# Patient Record
Sex: Male | Born: 1935 | Race: White | Hispanic: No | Smoking: Never smoker
Health system: Southern US, Community
[De-identification: ages and names within clinical notes are randomized; demographics above are authoritative.]

## PROBLEM LIST (undated history)

## (undated) DIAGNOSIS — R339 Retention of urine, unspecified: Secondary | ICD-10-CM

## (undated) DIAGNOSIS — R42 Dizziness and giddiness: Secondary | ICD-10-CM

## (undated) DIAGNOSIS — H8309 Labyrinthitis, unspecified ear: Secondary | ICD-10-CM

## (undated) DIAGNOSIS — C801 Malignant (primary) neoplasm, unspecified: Secondary | ICD-10-CM

## (undated) DIAGNOSIS — I10 Essential (primary) hypertension: Secondary | ICD-10-CM

## (undated) DIAGNOSIS — N210 Calculus in bladder: Secondary | ICD-10-CM

## (undated) SURGERY — CYSTOSCOPY, FLEXIBLE
Anesthesia: LOCAL

---

## 2007-10-05 ENCOUNTER — Emergency Department (HOSPITAL_COMMUNITY): Admission: EM | Admit: 2007-10-05 | Discharge: 2007-10-05 | Payer: Self-pay | Admitting: Emergency Medicine

## 2009-12-20 ENCOUNTER — Emergency Department (HOSPITAL_COMMUNITY): Admission: EM | Admit: 2009-12-20 | Discharge: 2009-12-20 | Payer: Self-pay | Admitting: Emergency Medicine

## 2010-08-04 LAB — DIFFERENTIAL
Eosinophils Absolute: 0.1 10*3/uL (ref 0.0–0.7)
Lymphocytes Relative: 15 % (ref 12–46)
Lymphs Abs: 1.3 10*3/uL (ref 0.7–4.0)
Monocytes Absolute: 0.6 10*3/uL (ref 0.1–1.0)
Monocytes Relative: 7 % (ref 3–12)

## 2010-08-04 LAB — POCT CARDIAC MARKERS
CKMB, poc: <1 ng/mL — ABNORMAL LOW (ref 1.0–8.0)
Myoglobin, poc: 46.1 ng/mL (ref 12–200)
Troponin i, poc: <0.05 ng/mL (ref 0.00–0.09)

## 2010-08-04 LAB — CBC
Hemoglobin: 15.5 g/dL (ref 13.0–17.0)
MCHC: 33.7 g/dL (ref 30.0–36.0)
MCV: 93.6 fL (ref 78.0–100.0)
WBC: 8.6 10*3/uL (ref 4.0–10.5)

## 2010-08-04 LAB — URINALYSIS, ROUTINE W REFLEX MICROSCOPIC
Glucose, UA: >1000 mg/dL — AB
Leukocytes, UA: NEGATIVE
Specific Gravity, Urine: 1.02 (ref 1.005–1.030)

## 2010-08-04 LAB — BASIC METABOLIC PANEL
BUN: 23 mg/dL (ref 6–23)
Calcium: 8.5 mg/dL (ref 8.4–10.5)
GFR calc non Af Amer: >60 mL/min (ref >60)
Potassium: 4.5 mEq/L (ref 3.5–5.1)
Sodium: 136 mEq/L (ref 135–145)

## 2010-08-04 LAB — GLUCOSE, CAPILLARY: Glucose-Capillary: 428 mg/dL — ABNORMAL HIGH (ref 70–99)

## 2011-06-15 ENCOUNTER — Inpatient Hospital Stay (HOSPITAL_COMMUNITY): Payer: PRIVATE HEALTH INSURANCE

## 2011-06-15 ENCOUNTER — Emergency Department (HOSPITAL_COMMUNITY): Payer: PRIVATE HEALTH INSURANCE

## 2011-06-15 ENCOUNTER — Other Ambulatory Visit: Payer: Self-pay

## 2011-06-15 ENCOUNTER — Encounter (HOSPITAL_COMMUNITY): Payer: Self-pay

## 2011-06-15 ENCOUNTER — Inpatient Hospital Stay (HOSPITAL_COMMUNITY)
Admission: AD | Admit: 2011-06-15 | Discharge: 2011-06-19 | DRG: 639 | Disposition: A | Discharge status: Home Health | Payer: PRIVATE HEALTH INSURANCE | Attending: Internal Medicine | Admitting: Internal Medicine

## 2011-06-15 DIAGNOSIS — Y92009 Unspecified place in unspecified non-institutional (private) residence as the place of occurrence of the external cause: Secondary | ICD-10-CM

## 2011-06-15 DIAGNOSIS — R296 Repeated falls: Secondary | ICD-10-CM | POA: Diagnosis present

## 2011-06-15 DIAGNOSIS — I517 Cardiomegaly: Secondary | ICD-10-CM

## 2011-06-15 DIAGNOSIS — E118 Type 2 diabetes mellitus with unspecified complications: Principal | ICD-10-CM | POA: Diagnosis present

## 2011-06-15 DIAGNOSIS — R627 Adult failure to thrive: Secondary | ICD-10-CM | POA: Diagnosis present

## 2011-06-15 DIAGNOSIS — E1165 Type 2 diabetes mellitus with hyperglycemia: Secondary | ICD-10-CM | POA: Diagnosis present

## 2011-06-15 DIAGNOSIS — R5381 Other malaise: Secondary | ICD-10-CM | POA: Diagnosis present

## 2011-06-15 DIAGNOSIS — Y998 Other external cause status: Secondary | ICD-10-CM

## 2011-06-15 DIAGNOSIS — R471 Dysarthria and anarthria: Secondary | ICD-10-CM | POA: Diagnosis present

## 2011-06-15 DIAGNOSIS — R739 Hyperglycemia, unspecified: Secondary | ICD-10-CM | POA: Diagnosis present

## 2011-06-15 DIAGNOSIS — IMO0002 Reserved for concepts with insufficient information to code with codable children: Principal | ICD-10-CM | POA: Diagnosis present

## 2011-06-15 DIAGNOSIS — Z7982 Long term (current) use of aspirin: Secondary | ICD-10-CM

## 2011-06-15 DIAGNOSIS — J019 Acute sinusitis, unspecified: Secondary | ICD-10-CM | POA: Diagnosis present

## 2011-06-15 DIAGNOSIS — R6251 Failure to thrive (child): Secondary | ICD-10-CM

## 2011-06-15 DIAGNOSIS — R269 Unspecified abnormalities of gait and mobility: Secondary | ICD-10-CM | POA: Diagnosis present

## 2011-06-15 DIAGNOSIS — E860 Dehydration: Secondary | ICD-10-CM | POA: Diagnosis present

## 2011-06-15 HISTORY — DX: Labyrinthitis, unspecified ear: H83.09

## 2011-06-15 LAB — COMPREHENSIVE METABOLIC PANEL
ALT: 11 U/L (ref 0–53)
AST: 12 U/L (ref 0–37)
Calcium: 8.9 mg/dL (ref 8.4–10.5)
Chloride: 98 mEq/L (ref 96–112)
GFR calc Af Amer: 71 mL/min — ABNORMAL LOW (ref >90)
Glucose, Bld: 485 mg/dL — ABNORMAL HIGH (ref 70–99)
Potassium: 4.5 mEq/L (ref 3.5–5.1)

## 2011-06-15 LAB — DIFFERENTIAL
Basophils Absolute: 0 10*3/uL (ref 0.0–0.1)
Eosinophils Absolute: 0 10*3/uL (ref 0.0–0.7)
Lymphocytes Relative: 7 % — ABNORMAL LOW (ref 12–46)
Monocytes Relative: 9 % (ref 3–12)

## 2011-06-15 LAB — GLUCOSE, CAPILLARY: Glucose-Capillary: 265 mg/dL — ABNORMAL HIGH (ref 70–99)

## 2011-06-15 LAB — URINALYSIS, ROUTINE W REFLEX MICROSCOPIC
Bilirubin Urine: NEGATIVE
Glucose, UA: >1000 mg/dL — AB
Ketones, ur: >80 mg/dL — AB
Leukocytes, UA: NEGATIVE
Nitrite: NEGATIVE
Specific Gravity, Urine: 1.02 (ref 1.005–1.030)
Urobilinogen, UA: 0.2 mg/dL (ref 0.0–1.0)
pH: 6 (ref 5.0–8.0)

## 2011-06-15 LAB — CBC
HCT: 45.8 % (ref 39.0–52.0)
MCH: 32.9 pg (ref 26.0–34.0)
RBC: 5.02 MIL/uL (ref 4.22–5.81)
RDW: 12.5 % (ref 11.5–15.5)
WBC: 15.9 10*3/uL — ABNORMAL HIGH (ref 4.0–10.5)

## 2011-06-15 LAB — RAPID URINE DRUG SCREEN, HOSP PERFORMED
Cocaine: NOT DETECTED
Opiates: NOT DETECTED
Tetrahydrocannabinol: NOT DETECTED

## 2011-06-15 LAB — URINE MICROSCOPIC-ADD ON

## 2011-06-15 LAB — CARDIAC PANEL(CRET KIN+CKTOT+MB+TROPI)
Relative Index: 2.1 (ref 0.0–2.5)
Troponin I: <0.3 ng/mL (ref <0.30)

## 2011-06-15 MED ORDER — AZITHROMYCIN 250 MG PO TABS
500.0000 mg | ORAL_TABLET | Freq: Every day | ORAL | Status: AC
  Administered 2011-06-15: 500 mg via ORAL
  Filled 2011-06-15: qty 2

## 2011-06-15 MED ORDER — ASPIRIN 300 MG RE SUPP
300.0000 mg | Freq: Every day | RECTAL | Status: DC
  Filled 2011-06-15 (×6): qty 1

## 2011-06-15 MED ORDER — INSULIN ASPART 100 UNIT/ML ~~LOC~~ SOLN
0.0000 [IU] | Freq: Every day | SUBCUTANEOUS | Status: DC
  Administered 2011-06-16 – 2011-06-17 (×2): 2 [IU] via SUBCUTANEOUS

## 2011-06-15 MED ORDER — ASPIRIN 325 MG PO TABS
325.0000 mg | ORAL_TABLET | Freq: Every day | ORAL | Status: DC
  Administered 2011-06-15 – 2011-06-19 (×5): 325 mg via ORAL
  Filled 2011-06-15 (×5): qty 1

## 2011-06-15 MED ORDER — INSULIN GLARGINE 100 UNIT/ML ~~LOC~~ SOLN
10.0000 [IU] | Freq: Every day | SUBCUTANEOUS | Status: DC
  Administered 2011-06-15: 10 [IU] via SUBCUTANEOUS
  Filled 2011-06-15: qty 3

## 2011-06-15 MED ORDER — ONDANSETRON HCL 4 MG/2ML IJ SOLN: 4.0000 mg | Freq: Four times a day (QID) | INTRAMUSCULAR | Status: DC | PRN

## 2011-06-15 MED ORDER — AZITHROMYCIN 250 MG PO TABS
250.0000 mg | ORAL_TABLET | Freq: Every day | ORAL | Status: AC
  Administered 2011-06-16 – 2011-06-19 (×4): 250 mg via ORAL
  Filled 2011-06-15 (×4): qty 1

## 2011-06-15 MED ORDER — ENOXAPARIN SODIUM 40 MG/0.4ML ~~LOC~~ SOLN
40.0000 mg | SUBCUTANEOUS | Status: DC
  Administered 2011-06-15 – 2011-06-19 (×5): 40 mg via SUBCUTANEOUS
  Filled 2011-06-15 (×5): qty 0.4

## 2011-06-15 MED ORDER — SODIUM CHLORIDE 0.9 % IV SOLN
INTRAVENOUS | Status: DC
  Administered 2011-06-15 – 2011-06-17 (×3): via INTRAVENOUS

## 2011-06-15 MED ORDER — SODIUM CHLORIDE 0.9 % IV BOLUS (SEPSIS)
500.0000 mL | Freq: Once | INTRAVENOUS | Status: AC
  Administered 2011-06-15: 500 mL via INTRAVENOUS

## 2011-06-15 MED ORDER — ACETAMINOPHEN 325 MG PO TABS: 650.0000 mg | ORAL_TABLET | ORAL | Status: DC | PRN

## 2011-06-15 MED ORDER — ACETAMINOPHEN 650 MG RE SUPP: 650.0000 mg | RECTAL | Status: DC | PRN

## 2011-06-15 MED ORDER — SENNOSIDES-DOCUSATE SODIUM 8.6-50 MG PO TABS: 1.0000 | ORAL_TABLET | Freq: Every evening | ORAL | Status: DC | PRN

## 2011-06-15 MED ORDER — INSULIN ASPART 100 UNIT/ML ~~LOC~~ SOLN
0.0000 [IU] | Freq: Three times a day (TID) | SUBCUTANEOUS | Status: DC
  Administered 2011-06-15: 8 [IU] via SUBCUTANEOUS
  Administered 2011-06-15: 15 [IU] via SUBCUTANEOUS
  Administered 2011-06-16: 3 [IU] via SUBCUTANEOUS
  Administered 2011-06-16: 11 [IU] via SUBCUTANEOUS
  Administered 2011-06-16: 8 [IU] via SUBCUTANEOUS
  Administered 2011-06-17: 3 [IU] via SUBCUTANEOUS
  Administered 2011-06-17: 5 [IU] via SUBCUTANEOUS
  Administered 2011-06-18: 2 [IU] via SUBCUTANEOUS
  Administered 2011-06-18: 3 [IU] via SUBCUTANEOUS
  Administered 2011-06-19: 8 [IU] via SUBCUTANEOUS
  Filled 2011-06-15: qty 3

## 2011-06-15 MED ORDER — SODIUM CHLORIDE 0.9 % IV SOLN
INTRAVENOUS | Status: DC
  Administered 2011-06-15: 06:00:00 via INTRAVENOUS

## 2011-06-15 NOTE — Progress Notes (Signed)
UR Chart Review Completed  

## 2011-06-15 NOTE — ED Notes (Signed)
Family at bedside. 

## 2011-06-15 NOTE — H&P (Signed)
PCP:   Monica Becton, MD, MD   Chief Complaint:  Fall  HPI: This is 77 year old gentleman with a past history of diabetes who lives independently. Patient reports that he was at home today his usual state of health. He had been noticing that he can having leg weakness for quite some time now, on and off. He was walking back from his bathroom when he felt that his legs were too weak and he fell. Patient did not lose consciousness. He denies any head trauma. He denies any chest pain, shortness of breath. He reports having upper respiratory tract symptoms and has had "a cold "for some time now. His by mouth intake has been poor. Patient pressed his life alert bracelet and was brought to the emergency room for evaluation. His family presented to the hospital when they were informed that he was brought here by life alert. They cannot add much towards history since the last time they saw/spoke to him was approximately a week ago. It was noted in the emergency room the patient had a significant dysarthria. He was having difficulty articulating his words. His family reports that this is a significant difference from when I last saw him approximately a week ago. It is unclear as to the exact onset of symptoms. Due to patient's difficulty with speech, obtaining history has been very difficult. His family reports that he is fairly functional. He is able to pay his own bills, do his own shopping. He no longer drives but is otherwise very functional. He ambulates independently. He denies being started on any new medication. He's not sure what medication he takes. Allergies:  No Known Allergies    Past Medical History  Diagnosis Date  . Diabetes mellitus   . Inner ear inflammation     History reviewed. No pertinent past surgical history.  Prior to Admission medications   Not on File    Social History:  reports that he has never smoked. He does not have any smokeless tobacco history on file. He  reports that he does not drink alcohol or use illicit drugs.  No family history on file.  Review of Systems: Positives in bold Constitutional: Denies fever, chills, diaphoresis, appetite change and fatigue.  HEENT: Denies photophobia, eye pain, redness, hearing loss, ear pain, congestion, sore throat, rhinorrhea, sneezing, mouth sores, trouble swallowing, neck pain, neck stiffness and tinnitus.   Respiratory: Denies SOB, DOE, cough, chest tightness,  and wheezing.   Cardiovascular: Denies chest pain, palpitations and leg swelling.  Gastrointestinal: Denies nausea, vomiting, abdominal pain, diarrhea, constipation, blood in stool and abdominal distention.  Genitourinary: Denies dysuria, urgency, frequency, hematuria, flank pain and difficulty urinating.  Musculoskeletal: Denies myalgias, back pain, joint swelling, arthralgias and gait problem.  Skin: Denies pallor, rash and wound.  Neurological: Denies dizziness, seizures, syncope, weakness, light-headedness, numbness and headaches.  Hematological: Denies adenopathy. Easy bruising, personal or family bleeding history  Psychiatric/Behavioral: Denies suicidal ideation, mood changes, confusion, nervousness, sleep disturbance and agitation   Physical Exam: Blood pressure 145/62, pulse 84, temperature 98.6 F (37 C), temperature source Rectal, resp. rate 16, height 5\' 6"  (1.676 m), weight 63.504 kg (140 lb), SpO2 96.00%. General: Patient is in no acute distress, sitting up in bed, awake, speech is difficult to make out and is often incoherent HEENT: Normocephalic, atraumatic, pupils are equal and reactive to light, no obvious facial asymmetry Neck: Supple Chest: Clear to auscultation bilaterally Cardiac: S1, S2, regular rate and rhythm Abdomen: Soft, nontender, nondistended, bowel sounds are active  Extremities: No edema, cyanosis or clubbing Neurologic patient has 3 or 4 strength bilaterally lower extremities, 4-5 strength in the upper  extremities bilaterally, cranial nerves appear to be grossly intact Labs on Admission:  Results for orders placed during the hospital encounter of 06/15/11 (from the past 48 hour(s))  CBC     Status: Abnormal   Collection Time   06/15/11  5:13 AM      Component Value Range Comment   WBC 15.9 (*) 4.0 - 10.5 (K/uL)    RBC 5.02  4.22 - 5.81 (MIL/uL)    Hemoglobin 16.5  13.0 - 17.0 (g/dL)    HCT 78.2  95.6 - 21.3 (%)    MCV 91.2  78.0 - 100.0 (fL)    MCH 32.9  26.0 - 34.0 (pg)    MCHC 36.0  30.0 - 36.0 (g/dL)    RDW 08.6  57.8 - 46.9 (%)    Platelets 144 (*) 150 - 400 (K/uL)   DIFFERENTIAL     Status: Abnormal   Collection Time   06/15/11  5:13 AM      Component Value Range Comment   Neutrophils Relative 84 (*) 43 - 77 (%)    Neutro Abs 13.4 (*) 1.7 - 7.7 (K/uL)    Lymphocytes Relative 7 (*) 12 - 46 (%)    Lymphs Abs 1.1  0.7 - 4.0 (K/uL)    Monocytes Relative 9  3 - 12 (%)    Monocytes Absolute 1.4 (*) 0.1 - 1.0 (K/uL)    Eosinophils Relative 0  0 - 5 (%)    Eosinophils Absolute 0.0  0.0 - 0.7 (K/uL)    Basophils Relative 0  0 - 1 (%)    Basophils Absolute 0.0  0.0 - 0.1 (K/uL)   COMPREHENSIVE METABOLIC PANEL     Status: Abnormal   Collection Time   06/15/11  5:13 AM      Component Value Range Comment   Sodium 136  135 - 145 (mEq/L)    Potassium 4.5  3.5 - 5.1 (mEq/L)    Chloride 98  96 - 112 (mEq/L)    CO2 23  19 - 32 (mEq/L)    Glucose, Bld 485 (*) 70 - 99 (mg/dL)    BUN 20  6 - 23 (mg/dL)    Creatinine, Ser 6.29  0.50 - 1.35 (mg/dL)    Calcium 8.9  8.4 - 10.5 (mg/dL)    Total Protein 7.0  6.0 - 8.3 (g/dL)    Albumin 3.3 (*) 3.5 - 5.2 (g/dL)    AST 12  0 - 37 (U/L)    ALT 11  0 - 53 (U/L)    Alkaline Phosphatase 77  39 - 117 (U/L)    Total Bilirubin 1.1  0.3 - 1.2 (mg/dL)    GFR calc non Af Amer 62 (*) >90 (mL/min)    GFR calc Af Amer 71 (*) >90 (mL/min)   CARDIAC PANEL(CRET KIN+CKTOT+MB+TROPI)     Status: Normal   Collection Time   06/15/11  5:13 AM      Component  Value Range Comment   Total CK 80  7 - 232 (U/L)    CK, MB 1.6  0.3 - 4.0 (ng/mL)    Troponin I <0.30  <0.30 (ng/mL)    Relative Index RELATIVE INDEX IS INVALID  0.0 - 2.5    URINALYSIS, ROUTINE W REFLEX MICROSCOPIC     Status: Abnormal   Collection Time   06/15/11  6:14 AM  Component Value Range Comment   Color, Urine YELLOW  YELLOW     APPearance CLEAR  CLEAR     Specific Gravity, Urine 1.020  1.005 - 1.030     pH 6.0  5.0 - 8.0     Glucose, UA >1000 (*) NEGATIVE (mg/dL)    Hgb urine dipstick MODERATE (*) NEGATIVE     Bilirubin Urine NEGATIVE  NEGATIVE     Ketones, ur >80 (*) NEGATIVE (mg/dL)    Protein, ur TRACE (*) NEGATIVE (mg/dL)    Urobilinogen, UA 0.2  0.0 - 1.0 (mg/dL)    Nitrite NEGATIVE  NEGATIVE     Leukocytes, UA NEGATIVE  NEGATIVE    URINE MICROSCOPIC-ADD ON     Status: Normal   Collection Time   06/15/11  6:14 AM      Component Value Range Comment   WBC, UA 3-6  <3 (WBC/hpf)     Radiological Exams on Admission: Ct Head Wo Contrast  06/15/2011  *RADIOLOGY REPORT*  Clinical Data: Status post fall; unknown loss of consciousness. Concern for head injury.  CT HEAD WITHOUT CONTRAST  Technique:  Contiguous axial images were obtained from the base of the skull through the vertex without contrast.  Comparison: CT of the head performed 12/20/2009  Findings: There is no evidence of acute infarction, mass lesion, or intra- or extra-axial hemorrhage on CT.  Diffuse periventricular and subcortical white matter change likely reflects small vessel ischemic microangiopathy.  Mild cortical volume loss is noted.  Cerebellar atrophy is seen.  Chronic lacunar infarcts are seen within the basal ganglia bilaterally; a chronic right globus pallidus infarct appears new from 2011.  The brainstem and fourth ventricle are within normal limits.  The cerebral hemispheres demonstrate grossly normal gray-white differentiation.  No mass effect or midline shift is seen.  There is no evidence of  fracture; visualized osseous structures are unremarkable in appearance.  The orbits are within normal limits. There is near-complete opacification of the right maxillary sinus; the paranasal sinuses and mastoid air cells are well-aerated.  No significant soft tissue abnormalities are seen.  Cerumen is noted within the external auditory canals bilaterally, particularly on the left.  IMPRESSION:  1.  No evidence of traumatic intracranial injury or fracture. 2.  Mild cortical volume loss and diffuse small vessel ischemic microangiopathy. 3.  Chronic lacunar infarcts within the basal ganglia bilaterally. 4.  Near-complete opacification of the right maxillary sinus. 5.  Cerumen noted within the external auditory canals bilaterally, particularly on the left.  Original Report Authenticated By: Tonia Ghent, M.D.   Dg Chest Port 1 View  06/15/2011  *RADIOLOGY REPORT*  Clinical Data: Status post fall; concern for chest injury.  PORTABLE CHEST - 1 VIEW  Comparison: Chest radiograph performed 12/20/2009  Findings: The lungs are well-aerated and clear.  There is no evidence of focal opacification, pleural effusion or pneumothorax.  The cardiomediastinal silhouette is within normal limits.  No acute osseous abnormalities are seen.  IMPRESSION: No acute cardiopulmonary process seen; no displaced rib fractures identified.  Original Report Authenticated By: Tonia Ghent, M.D.    Assessment/Plan Principal Problem:  *CVA (cerebral infarction) Active Problems:  Hyperglycemia  Diabetes mellitus  Dysarthria  Acute sinusitis  Failure to thrive  Dehydration  Plan: Reason the patient was brought to the hospital was not entirely clear but has significant dysarthria is concerning. Patient reports that he was taking a baby aspirin but has been taking this consistently. He does not know the remainder of his medications. He  will need to be admitted to have a further stroke workup. He'll be monitored on telemetry, cardiac  enzymes will be cycled, he will get a 2-D echocardiogram, MRI of the brain, carotid Dopplers. We will also get physical therapy, speech therapy, occupational therapy to see the patient. Will check hemoglobin A1c, lipid panel.  He will receive aspirin. He is not a candidate for TPA therapy due to delay in patient arrival.  Regarding his hyperglycemia. He does not have any evidence of an elevated anion gap. We will start him on Lantus as well as sliding scale insulin and make adjustments accordingly until his home regimen can be determined.  Regarding his sinusitis will start him on a course of Z-Pak.  We'll provide gentle hydration.  Patient requests DO NOT RESUSCITATE, further orders per clinical course.  Time Spent on Admission:  Zaire Vanbuskirk [facility] 06/15/2011, 8:19 AM

## 2011-06-15 NOTE — ED Notes (Signed)
Inserted in & out cath to obtain urine. Noticed Dr Adriana Simas ordered to insert foley catheter. When questioned, MD stated we did not have to do the foley catheter since we obtained urine by straight cath. Verbal order to cancel foley catheter.

## 2011-06-15 NOTE — Progress Notes (Signed)
*  PRELIMINARY RESULTS* Echocardiogram 2D Echocardiogram has been performed.  Charles Gibbs Jane 06/15/2011, 1:56 PM 

## 2011-06-15 NOTE — ED Notes (Signed)
Pt given something to drink and eat per EDP orders prior to admitting hospitalist seeing patient. Pt diagnosis initially failure to thrive and hyperglycemia.

## 2011-06-15 NOTE — ED Notes (Signed)
Family at bedside. Pt sitting up eating breakfast tray at this time.

## 2011-06-15 NOTE — ED Provider Notes (Signed)
History     CSN: [id number]  Arrival date & time 06/15/11  0502   First MD Initiated Contact with Patient 06/15/11 0507      Chief Complaint  Patient presents with  . Fall    (Consider location/radiation/quality/duration/timing/severity/associated sxs/prior treatment) HPI....Marland Kitchenlevel V caveat for confusion. Patient fell on the floor at his home. He is unable to get up. EMS reports urinary incontinence on the couch. No chest pain, shortness of breath, stiff neck, dysuria. He lives alone. He is able answer questions reasonably well. Nothing makes symptoms better or  Past Medical History  Diagnosis Date  . Diabetes mellitus     History reviewed. No pertinent past surgical history.  No family history on file.  History  Substance Use Topics  . Smoking status: Never Smoker   . Smokeless tobacco: Not on file  . Alcohol Use: No      Review of Systems  Unable to perform ROS: Other    Allergies  Review of patient's allergies indicates no known allergies.  Home Medications  No current outpatient prescriptions on file.  BP 166/64  Pulse 86  Temp(Src) 98.6 F (37 C) (Rectal)  Resp 18  Ht 5\' 6"  (1.676 m)  Wt 140 lb (63.504 kg)  BMI 22.60 kg/m2  SpO2 96%  Physical Exam  Nursing note and vitals reviewed. Constitutional: He appears well-developed and well-nourished.       Obvious urinary incontinence, slight confusion.  HENT:  Head: Normocephalic and atraumatic.  Eyes: Conjunctivae and EOM are normal. Pupils are equal, round, and reactive to light.  Neck: Normal range of motion. Neck supple.  Cardiovascular: Normal rate and regular rhythm.   Pulmonary/Chest: Effort normal and breath sounds normal.  Abdominal: Soft. Bowel sounds are normal.  Musculoskeletal: Normal range of motion.  Neurological:       Knows name and place, not date the  Skin: Skin is warm and dry.  Psychiatric:       Flat affect    ED Course  Procedures (including critical care time)   Labs  Reviewed  CBC  DIFFERENTIAL  COMPREHENSIVE METABOLIC PANEL  URINALYSIS, ROUTINE W REFLEX MICROSCOPIC  CARDIAC PANEL(CRET KIN+CKTOT+MB+TROPI)   No results found.   No diagnosis found.  Date: 06/15/2011  Rate: 84  Rhythm: normal sinus rhythm  QRS Axis: left  Intervals: normal  ST/T Wave abnormalities: normal  Conduction Disutrbances:none  Narrative Interpretation:   Old EKG Reviewed: unchanged    MDM  Glucose is elevated. Urinalysis and chest x-ray pending. Patient is obviously unable to care for self. Admit        Donnetta Hutching, MD 06/15/11 [phone]

## 2011-06-15 NOTE — ED Notes (Signed)
Pt hit lifeline, ems arrived to find pt in floor, alert but unable to answer simple ?'s

## 2011-06-16 LAB — LIPID PANEL
LDL Cholesterol: 66 mg/dL (ref 0–99)
Triglycerides: 79 mg/dL (ref <150)

## 2011-06-16 LAB — GLUCOSE, CAPILLARY
Glucose-Capillary: 309 mg/dL — ABNORMAL HIGH (ref 70–99)
Glucose-Capillary: 287 mg/dL — ABNORMAL HIGH (ref 70–99)
Glucose-Capillary: 210 mg/dL — ABNORMAL HIGH (ref 70–99)

## 2011-06-16 LAB — CARDIAC PANEL(CRET KIN+CKTOT+MB+TROPI)
CK, MB: 2.5 ng/mL (ref 0.3–4.0)
Relative Index: 2.2 (ref 0.0–2.5)

## 2011-06-16 LAB — HEMOGLOBIN A1C: Hgb A1c MFr Bld: 12.2 % — ABNORMAL HIGH (ref <5.7)

## 2011-06-16 MED ORDER — GLIPIZIDE 5 MG PO TABS
5.0000 mg | ORAL_TABLET | Freq: Two times a day (BID) | ORAL | Status: DC
  Administered 2011-06-16 – 2011-06-19 (×6): 5 mg via ORAL
  Filled 2011-06-16 (×6): qty 1

## 2011-06-16 NOTE — Evaluation (Signed)
Physical Therapy Evaluation Patient Details Name: Charles Gibbs MRN: [id number] DOB: [birth year] Today's Date: 06/16/2011  Problem List:  Patient Active Problem List  Diagnoses  . Hyperglycemia  . Diabetes mellitus  . Dysarthria  . Acute sinusitis  . Failure to thrive  . Dehydration    Past Medical History:  Past Medical History  Diagnosis Date  . Diabetes mellitus   . Inner ear inflammation    Past Surgical History: History reviewed. No pertinent past surgical history.  PT Assessment/Plan/Recommendation PT Assessment Clinical Impression Statement: Pt with CVA who presents with general weaknes but strength is wfl to allow mobility.  Pt will benefit from [facility] for home eval to ensure safety. PT Recommendation/Assessment: All further PT needs can be met in the next venue of care PT Problem List: Decreased strength PT Therapy Diagnosis : Generalized weakness;Hemiplegia dominant side PT Recommendation Follow Up Recommendations: Home health PT PT Goals  Acute Rehab PT Goals PT Goal Formulation: With patient Time For Goal Achievement: 4 days Pt will go Supine/Side to Sit: Independently Pt will Ambulate: >150 feet;Independently;with least restrictive assistive device Pt will Go Up / Down Stairs: 3-5 stairs;with modified independence  PT Evaluation Precautions/Restrictions    Prior Functioning  Home Living Lives With: Alone Receives Help From: Home health;Family Type of Home: House Home Layout: One level Home Access: Stairs to enter Entrance Stairs-Rails: Right Entrance Stairs-Number of Steps: 3 Bathroom Shower/Tub: Health visitor: Standard Home Adaptive Equipment: Environmental consultant - standard;Straight cane Prior Function Level of Independence: Independent with homemaking with ambulation;Independent with basic ADLs Driving: No Vocation: Retired Producer, television/film/video: Awake/alert Overall Cognitive Status: Appears within functional limits for  tasks assessed Orientation Level: Oriented X4 Sensation/Coordination Sensation Light Touch: Appears Intact Coordination Gross Motor Movements are Fluid and Coordinated: Yes Fine Motor Movements are Fluid and Coordinated: Not tested Extremity Assessment RUE Assessment RUE Assessment: Not tested LUE Assessment LUE Assessment: Not tested RLE Assessment RLE Assessment: Within Functional Limits (strength decreased generally 3+/5) LLE Assessment LLE Assessment: Within Functional Limits (strength decreased generally 3+/5) Mobility (including Balance) Bed Mobility Bed Mobility: Yes Supine to Sit: 6: Modified independent (Device/Increase time) Transfers Transfers: Yes Sit to Stand: 7: Independent Stand to Sit: 7: Independent Ambulation/Gait Ambulation/Gait: Yes Ambulation/Gait Assistance: 6: Modified independent (Device/Increase time) Ambulation Distance (Feet): 200 Feet Assistive device: Rolling walker;None (100 ft with RW; 100 ft with no AD) Gait Pattern: Decreased hip/knee flexion - right Stairs: No Wheelchair Mobility Wheelchair Mobility: No  Balance Balance Assessed: No Exercise    End of Session PT - End of Session Equipment Utilized During Treatment: Gait belt Activity Tolerance: Patient tolerated treatment well Patient left: in chair Nurse Communication: Mobility status for transfers General Behavior During Session: [facility] for tasks performed Cognition: [facility] for tasks performed  RUSSELL,CINDY 06/16/2011, 2:20 PM

## 2011-06-16 NOTE — Plan of Care (Signed)
Problem: Phase I Progression Outcomes Goal: Voiding-avoid urinary catheter unless indicated Outcome: Completed/Met Date Met:  06/16/11 vioding

## 2011-06-16 NOTE — Progress Notes (Addendum)
Subjective: Patient's speech is significantly improved. Stroke work up thus far has been negative. Denies any complaints today, feels weak.  Objective: Vital signs in last 24 hours: Temp:  [97 F (36.1 C)-98.4 F (36.9 C)] 97.9 F (36.6 C) (01/26 1000) Pulse Rate:  [70-80] 70  (01/26 1000) Resp:  [18] 18  (01/26 1000) BP: (103-163)/(56-70) 127/64 mmHg (01/26 1000) SpO2:  [96 %-97 %] 97 % (01/26 1000) Weight change: -7.404 kg (-16 lb 5.2 oz) Last BM Date: 06/16/11  Intake/Output from previous day: 01/25 0701 - 01/26 0700 In: 1582.5 [P.O.:720; I.V.:862.5] Out: 201 [Urine:200; Stool:1] Total I/O In: -  Out: 301 [Urine:300; Stool:1]   Physical Exam: General: Alert, awake, oriented x3, in no acute distress. HEENT: No bruits, no goiter. Heart: Regular rate and rhythm, without murmurs, rubs, gallops. Lungs: Clear to auscultation bilaterally. Abdomen: Soft, nontender, nondistended, positive bowel sounds. Extremities: No clubbing cyanosis or edema with positive pedal pulses. Neuro: Grossly intact, nonfocal.    Lab Results: Basic Metabolic Panel:  Basename 06/15/11 0513  NA 136  K 4.5  CL 98  CO2 23  GLUCOSE 485*  BUN 20  CREATININE 1.09  CALCIUM 8.9  MG --  PHOS --   Liver Function Tests:  Basename 06/15/11 0513  AST 12  ALT 11  ALKPHOS 77  BILITOT 1.1  PROT 7.0  ALBUMIN 3.3*   No results found for this basename: LIPASE:2,AMYLASE:2 in the last 72 hours No results found for this basename: AMMONIA:2 in the last 72 hours CBC:  Basename 06/15/11 0513  WBC 15.9*  NEUTROABS 13.4*  HGB 16.5  HCT 45.8  MCV 91.2  PLT 144*   Cardiac Enzymes:  Basename 06/16/11 0137 06/15/11 1706 06/15/11 0513  CKTOTAL 112 132 80  CKMB 2.5 2.8 1.6  CKMBINDEX -- -- --  TROPONINI <0.30 <0.30 <0.30   BNP: No results found for this basename: PROBNP:3 in the last 72 hours D-Dimer: No results found for this basename: DDIMER:2 in the last 72 hours CBG:  Basename 06/16/11  1150 06/16/11 0726 06/15/11 2029 06/15/11 1628 06/15/11 1221  GLUCAP 309* 176* 135* 265* 491*   Hemoglobin A1C: No results found for this basename: HGBA1C in the last 72 hours Fasting Lipid Panel:  Basename 06/16/11 0300  CHOL 132  HDL 50  LDLCALC 66  TRIG 79  CHOLHDL 2.6  LDLDIRECT --   Thyroid Function Tests:  Basename 06/15/11 0945  TSH 0.466  T4TOTAL --  FREET4 --  T3FREE --  THYROIDAB --   Anemia Panel: No results found for this basename: VITAMINB12,FOLATE,FERRITIN,TIBC,IRON,RETICCTPCT in the last 72 hours Coagulation: No results found for this basename: LABPROT:2,INR:2 in the last 72 hours Urine Drug Screen: Drugs of Abuse     Component Value Date/Time   LABOPIA NONE DETECTED 06/15/2011 1555   COCAINSCRNUR NONE DETECTED 06/15/2011 1555   LABBENZ NONE DETECTED 06/15/2011 1555   AMPHETMU NONE DETECTED 06/15/2011 1555   THCU NONE DETECTED 06/15/2011 1555   LABBARB NONE DETECTED 06/15/2011 1555    Alcohol Level:  Basename 06/15/11 0945  ETH <11    No results found for this or any previous visit (from the past 240 hour(s)).  Studies/Results: Ct Head Wo Contrast  06/15/2011  *RADIOLOGY REPORT*  Clinical Data: Status post fall; unknown loss of consciousness. Concern for head injury.  CT HEAD WITHOUT CONTRAST  Technique:  Contiguous axial images were obtained from the base of the skull through the vertex without contrast.  Comparison: CT of the head performed 12/20/2009  Findings: There is no evidence of acute infarction, mass lesion, or intra- or extra-axial hemorrhage on CT.  Diffuse periventricular and subcortical white matter change likely reflects small vessel ischemic microangiopathy.  Mild cortical volume loss is noted.  Cerebellar atrophy is seen.  Chronic lacunar infarcts are seen within the basal ganglia bilaterally; a chronic right globus pallidus infarct appears new from 2011.  The brainstem and fourth ventricle are within normal limits.  The cerebral hemispheres  demonstrate grossly normal gray-white differentiation.  No mass effect or midline shift is seen.  There is no evidence of fracture; visualized osseous structures are unremarkable in appearance.  The orbits are within normal limits. There is near-complete opacification of the right maxillary sinus; the paranasal sinuses and mastoid air cells are well-aerated.  No significant soft tissue abnormalities are seen.  Cerumen is noted within the external auditory canals bilaterally, particularly on the left.  IMPRESSION:  1.  No evidence of traumatic intracranial injury or fracture. 2.  Mild cortical volume loss and diffuse small vessel ischemic microangiopathy. 3.  Chronic lacunar infarcts within the basal ganglia bilaterally. 4.  Near-complete opacification of the right maxillary sinus. 5.  Cerumen noted within the external auditory canals bilaterally, particularly on the left.  Original Report Authenticated By: Tonia Ghent, M.D.   Mr Maxine Glenn Head Wo Contrast  06/15/2011  *RADIOLOGY REPORT*  Clinical Data:  78 year old male with recent falls and weakness.  Comparison: Head CTs 06/15/2011 and earlier.  MRI HEAD WITHOUT CONTRAST  Technique: Multiplanar, multiecho pulse sequences of the brain and surrounding structures were obtained according to standard protocol without intravenous contrast.  Findings: No restricted diffusion to suggest acute infarction.  No midline shift, mass effect, evidence of mass lesion, ventriculomegaly, extra-axial collection or acute intracranial hemorrhage.  Cervicomedullary junction and pituitary are within normal limits.  Major intracranial vascular flow voids are preserved.  Confluent T2 and FLAIR hyperintensity throughout the cerebral white matter.  Temporal lobe involvement.  Comparatively mild white matter signal changes in the brainstem.  T2 and FLAIR hyperintensity also noted in the deep cerebellar nuclei.  No cerebellar peduncle involvement.  No cortical encephalomalacia. Chronic lacunar  infarct in the right globus pallidus.  Mild T2 heterogeneity for age in the deep gray matter nuclei elsewhere. Negative visualized cervical spine.  Visualized bone marrow signal is within normal limits.  Visualized orbit soft tissues are within normal limits.  Mastoids are clear. There is mucosal thickening in the right maxillary and ethmoid sinuses, the right maxillary is opacified.  Visualized scalp soft tissues are within normal limits.  IMPRESSION: 1. No acute intracranial abnormality. 2.  Advanced nonspecific signal changes in the white matter and deep cerebellar nuclei.  Favor chronic small vessel disease. 3.  MRA findings are below.  MRA HEAD WITHOUT CONTRAST  Technique: Angiographic images of the Circle of Willis were obtained using MRA technique without  intravenous contrast.  Findings: Antegrade flow in the posterior circulation.  Nondominant distal left vertebral artery functionally terminates in PICA. Right vertebral artery supplies the basilar.  No basilar artery stenosis.  Superior cerebellar arteries and PCA origins are patent. Posterior communicating arteries are diminutive or absent.  MOTSA artifact occurs at the level of the PCA course.  MIP images suggest bilateral PCA stenosis, but I feel this is related to be artifact.  Antegrade flow in both ICA siphons.  Cavernous segment irregularity but no significant ICA stenosis.  Ophthalmic artery origins are within normal limits.  Normal carotid termini.  Dominant left ACA A1 segment.  Diminutive anterior communicating artery.  Visualized ACA branches are within normal limits.  Visualized bilateral MCA branches are also affected by the MOTSA artifact.  No major MCA branch stenosis or occlusion identified.  IMPRESSION:  Negative for age intracranial MRA, with degradation of medium-sized branch detail due to MOTSA artifact.  Original Report Authenticated By: Harley Hallmark, M.D.   Mr Brain Wo Contrast  06/15/2011  *RADIOLOGY REPORT*  Clinical Data:   78 year old male with recent falls and weakness.  Comparison: Head CTs 06/15/2011 and earlier.  MRI HEAD WITHOUT CONTRAST  Technique: Multiplanar, multiecho pulse sequences of the brain and surrounding structures were obtained according to standard protocol without intravenous contrast.  Findings: No restricted diffusion to suggest acute infarction.  No midline shift, mass effect, evidence of mass lesion, ventriculomegaly, extra-axial collection or acute intracranial hemorrhage.  Cervicomedullary junction and pituitary are within normal limits.  Major intracranial vascular flow voids are preserved.  Confluent T2 and FLAIR hyperintensity throughout the cerebral white matter.  Temporal lobe involvement.  Comparatively mild white matter signal changes in the brainstem.  T2 and FLAIR hyperintensity also noted in the deep cerebellar nuclei.  No cerebellar peduncle involvement.  No cortical encephalomalacia. Chronic lacunar infarct in the right globus pallidus.  Mild T2 heterogeneity for age in the deep gray matter nuclei elsewhere. Negative visualized cervical spine.  Visualized bone marrow signal is within normal limits.  Visualized orbit soft tissues are within normal limits.  Mastoids are clear. There is mucosal thickening in the right maxillary and ethmoid sinuses, the right maxillary is opacified.  Visualized scalp soft tissues are within normal limits.  IMPRESSION: 1. No acute intracranial abnormality. 2.  Advanced nonspecific signal changes in the white matter and deep cerebellar nuclei.  Favor chronic small vessel disease. 3.  MRA findings are below.  MRA HEAD WITHOUT CONTRAST  Technique: Angiographic images of the Circle of Willis were obtained using MRA technique without  intravenous contrast.  Findings: Antegrade flow in the posterior circulation.  Nondominant distal left vertebral artery functionally terminates in PICA. Right vertebral artery supplies the basilar.  No basilar artery stenosis.  Superior  cerebellar arteries and PCA origins are patent. Posterior communicating arteries are diminutive or absent.  MOTSA artifact occurs at the level of the PCA course.  MIP images suggest bilateral PCA stenosis, but I feel this is related to be artifact.  Antegrade flow in both ICA siphons.  Cavernous segment irregularity but no significant ICA stenosis.  Ophthalmic artery origins are within normal limits.  Normal carotid termini.  Dominant left ACA A1 segment.  Diminutive anterior communicating artery.  Visualized ACA branches are within normal limits.  Visualized bilateral MCA branches are also affected by the MOTSA artifact.  No major MCA branch stenosis or occlusion identified.  IMPRESSION:  Negative for age intracranial MRA, with degradation of medium-sized branch detail due to MOTSA artifact.  Original Report Authenticated By: Harley Hallmark, M.D.   US Carotid Duplex Bilateral  06/15/2011  *RADIOLOGY REPORT*  Clinical Data: Stroke, history diabetes  BILATERAL CAROTID DUPLEX ULTRASOUND  Technique: Wallace Cullens scale imaging, color Doppler and duplex ultrasound was performed of bilateral carotid and vertebral arteries in the neck.  Comparison:  None  Criteria:  Quantification of carotid stenosis is based on velocity parameters that correlate the residual internal carotid diameter with NASCET-based stenosis levels, using the diameter of the distal internal carotid lumen as the denominator for stenosis measurement.  The following velocity measurements were obtained:  PEAK SYSTOLIC/END DIASTOLIC RIGHT ICA:                        70/11cm/sec CCA:                        107/4cm/sec SYSTOLIC ICA/CCA RATIO:     0.58 DIASTOLIC ICA/CCA RATIO:    2.75 ECA:                        125cm/sec  LEFT ICA:                        160/21cm/sec CCA:                        139/5cm/sec SYSTOLIC ICA/CCA RATIO:     1.01 DIASTOLIC ICA/CCA RATIO:    2.20 ECA:                        119cm/sec  Findings:  RIGHT CAROTID ARTERY:  Tortuous right carotid system.  Intimal thickening right CCA.  Mild plaque right carotid bulb and bifurcation into proximal right ECA and ICA.  Turbulence in mid to distal right ICA at an area tortuosity with associated spectral broadening on waveform analysis.  No high velocity jets.  RIGHT VERTEBRAL ARTERY:  Patent, antegrade  LEFT CAROTID ARTERY: Tortuous left carotid system.  Intimal thickening left CCA.  Minimal plaque left carotid bifurcation. Turbulent flow distal left ICA beyond a marked area tortuosity, associated with spectral broadening on waveform analysis and increased peak systolic velocity.  LEFT VERTEBRAL ARTERY:  Patent, antegrade  IMPRESSION: Minimal plaque formation. Tortuous carotid systems bilaterally.  No evidence of hemodynamically significant stenosis. Elevated peak systolic velocity in left ICA appears to be related to the marked tortuosity of the left ICA, with no significant plaque formation seen at this site.  Original Report Authenticated By: Lollie Marrow, M.D.   Dg Chest Port 1 View  06/15/2011  *RADIOLOGY REPORT*  Clinical Data: Status post fall; concern for chest injury.  PORTABLE CHEST - 1 VIEW  Comparison: Chest radiograph performed 12/20/2009  Findings: The lungs are well-aerated and clear.  There is no evidence of focal opacification, pleural effusion or pneumothorax.  The cardiomediastinal silhouette is within normal limits.  No acute osseous abnormalities are seen.  IMPRESSION: No acute cardiopulmonary process seen; no displaced rib fractures identified.  Original Report Authenticated By: Tonia Ghent, M.D.    Medications: Scheduled Meds:   . aspirin  300 mg Rectal Daily   Or  . aspirin  325 mg Oral Daily  . azithromycin  500 mg Oral Daily   Followed by  . azithromycin  250 mg Oral Daily  . enoxaparin  40 mg Subcutaneous Q24H  . insulin aspart  0-15 Units Subcutaneous TID WC  . insulin aspart  0-5 Units Subcutaneous QHS  . insulin glargine  10 Units  Subcutaneous QHS   Continuous Infusions:   . sodium chloride 75 mL/hr at 06/15/11 2100   PRN Meds:.acetaminophen, acetaminophen, ondansetron (ZOFRAN) IV, senna-docusate  Assessment/Plan:  Active Problems:  Hyperglycemia  Diabetes mellitus  Dysarthria  Acute sinusitis  Failure to thrive  Dehydration  Plan:  Patient was initially admitted for dysarthria with concern for possible CVA.  His MRI brain is negative for any acute infarct.  His speech has significantly improved today.  Other work up including  cxr, ua, tsh were found to be negative. Patient was dehydrated on admission.  This may have been playing a role in his symptoms.    His blood sugars are improved from yesterday, although still high.  He does not remember the medications that he takes at home. HgbA1C is pending  Currently we will await PT evaluation to see how much care patient will need post discharge.  Further disposition will depend on how he does with physical therapy.   LOS: 1 day   Amaad Byers [facility] 06/16/2011, 12:04 PM   Unable to reach the patient's family to discuss disposition.  Tried to call his pharmacy to obtain medication list of diabetic meds, but pharmacy closed.  Will start him on glipizide to control blood sugars and monitor CBGs. Will need to discuss a safe discharge plan with family prior to discharge home for this elderly gentleman.

## 2011-06-16 NOTE — Progress Notes (Signed)
Speech Language/PathologyThe Clinical/Bedside Swallow Evaluation Patient Details  Name: Charles Gibbs MRN: [id number] DOB: May 06, 1928 Today's Date: 06/16/2011  Past Medical History:  Past Medical History  Diagnosis Date  . Diabetes mellitus   . Inner ear inflammation    Past Surgical History: History reviewed. No pertinent past surgical history. HPI:    The patient is an 78 year old male admitted to the emergency room.  Upon presentation, he was noted to be dysarthric, but this has improved with hydration.  MRI of the head is negative for acute CVA.  Chest x-ray also negative.   Assessment/Recommendations/Treatment Plan  Patient's oral mechanism examination is within functional limits except for mild decreased lingual strength. He did have delayed cough with thin liquids via straw x 3.  Cough was strong, and he was able to clear and produce clear voice after cough.  If he is aspirating, cough may be clearing aspirate, as he does not report pneumonia history, and chest x-ray at this time is negative.  Patient does report that at times he gets  "straggled" and has to cough hard.   SLP Assessment Clinical Impression Statement: Possible pharyngeal dysphagia as evidenced by intermittent delayed cough with thin via straw. Risk for Aspiration: Mild  Swallow Evaluation Recommendations Recommended Consults: MBS (MBS to rule out aspiration with thin liquid consistencies. ) Solid Consistency: Regular Liquid Consistency: Thin Liquid Administration via: Cup;No straw (No straws.) Supervision: Patient able to self feed Compensations: Slow rate;Small sips/bites Postural Changes and/or Swallow Maneuvers: Seated upright 90 degrees Oral Care Recommendations: Oral care BID  Treatment Plan Treatment Plan Recommendations: F/U MBS in ___ days (Comment) (Recommend MBS in 2-3 days. )  Prognosis Prognosis for Safe Diet Advancement: Good  Individuals Consulted Consulted and Agree with Results and  Recommendations: RN  Swallowing Goals     Swallow Study Prior Functional Status  Type of Home: House Lives With: Alone Receives Help From: Home health;Family Vocation: Retired  Tax adviser Function  Labial ROM: Within Functional Limits Labial Symmetry: Within Functional Limits Labial Strength: Within Functional Limits Labial Sensation: Within Functional Limits Lingual ROM:  (Mild decrease in lingual ROM) Lingual Symmetry: Within Functional Limits Lingual Strength: Reduced Lingual Sensation: Within Functional Limits Facial ROM: Within Functional Limits Facial Symmetry: Within Functional Limits Facial Strength: Within Functional Limits Facial Sensation: Within Functional Limits Velum: Within Functional Limits Mandible: Within Functional Limits  Consistency Results  Ice Chips Ice chips: Not tested  Thin Liquid Thin Liquid: Impaired (Delayed cough x 3 with straw, no cough via cup) Presentation: Straw;Cup Pharyngeal  Phase Impairments: Cough - Delayed  Nectar Thick Liquid Nectar Thick Liquid: Not tested  Honey Thick Liquid Honey Thick Liquid: Not tested  Puree Puree: Within functional limits  Solid Solid: Within functional limits   Jersie Beel, Gwendolyn Fill 06/16/2011,2:59 PM

## 2011-06-16 NOTE — Plan of Care (Signed)
Problem: Phase I Progression Outcomes Goal: Pain controlled with appropriate interventions Outcome: Completed/Met Date Met:  06/16/11 Denies having pain

## 2011-06-17 LAB — BASIC METABOLIC PANEL WITH GFR
BUN: 20 mg/dL (ref 6–23)
CO2: 20 meq/L (ref 19–32)
Calcium: 7.6 mg/dL — ABNORMAL LOW (ref 8.4–10.5)
Chloride: 108 meq/L (ref 96–112)
Creatinine, Ser: 0.67 mg/dL (ref 0.50–1.35)
GFR calc Af Amer: >90 mL/min
GFR calc non Af Amer: 88 mL/min — ABNORMAL LOW
Glucose, Bld: 108 mg/dL — ABNORMAL HIGH (ref 70–99)
Potassium: 3.1 meq/L — ABNORMAL LOW (ref 3.5–5.1)
Sodium: 137 meq/L (ref 135–145)

## 2011-06-17 LAB — CBC
HCT: 38.1 % — ABNORMAL LOW (ref 39.0–52.0)
Hemoglobin: 13.7 g/dL (ref 13.0–17.0)
MCH: 31.9 pg (ref 26.0–34.0)
MCHC: 36 g/dL (ref 30.0–36.0)
MCV: 88.8 fL (ref 78.0–100.0)
Platelets: 159 K/uL (ref 150–400)
RBC: 4.29 MIL/uL (ref 4.22–5.81)
RDW: 12.4 % (ref 11.5–15.5)
WBC: 8 K/uL (ref 4.0–10.5)

## 2011-06-17 LAB — GLUCOSE, CAPILLARY: Glucose-Capillary: 99 mg/dL (ref 70–99)

## 2011-06-17 NOTE — Progress Notes (Signed)
Subjective: This man was admitted with dysarthria which is resolved. There is no evidence of CVA. His MRI brain was essentially unremarkable. Bilateral carotid Dopplers and 2-D echocardiogram also unremarkable. The main issue is whether he can be sent home reasonably safely. Speech language pathology recommending a modified barium swallow .           Physical Exam: Blood pressure 125/66, pulse 81, temperature 97.8 F (36.6 C), temperature source Oral, resp. rate 16, height 5\' 6"  (1.676 m), weight 56.1 kg (123 lb 10.9 oz), SpO2 98.00%. He looks systemically well. Heart sounds are present and normal. Lung fields are clear. There are no focal neurological signs whatsoever. He does not have any dysarthria now.   Investigations:     Basic Metabolic Panel:  Basename 06/17/11 0551 06/15/11 0513  NA 137 136  K 3.1* 4.5  CL 108 98  CO2 20 23  GLUCOSE 108* 485*  BUN 20 20  CREATININE 0.67 1.09  CALCIUM 7.6* 8.9  MG -- --  PHOS -- --   Liver Function Tests:  [facility] 06/15/11 0513  AST 12  ALT 11  ALKPHOS 77  BILITOT 1.1  PROT 7.0  ALBUMIN 3.3*     CBC:  Basename 06/17/11 0551 06/15/11 0513  WBC 8.0 15.9*  NEUTROABS -- 13.4*  HGB 13.7 16.5  HCT 38.1* 45.8  MCV 88.8 91.2  PLT 159 144*    Mr Maxine Glenn Head Wo Contrast  06/15/2011  *RADIOLOGY REPORT*  Clinical Data:  78 year old male with recent falls and weakness.  Comparison: Head CTs 06/15/2011 and earlier.  MRI HEAD WITHOUT CONTRAST  Technique: Multiplanar, multiecho pulse sequences of the brain and surrounding structures were obtained according to standard protocol without intravenous contrast.  Findings: No restricted diffusion to suggest acute infarction.  No midline shift, mass effect, evidence of mass lesion, ventriculomegaly, extra-axial collection or acute intracranial hemorrhage.  Cervicomedullary junction and pituitary are within normal limits.  Major intracranial vascular flow voids are preserved.  Confluent T2  and FLAIR hyperintensity throughout the cerebral white matter.  Temporal lobe involvement.  Comparatively mild white matter signal changes in the brainstem.  T2 and FLAIR hyperintensity also noted in the deep cerebellar nuclei.  No cerebellar peduncle involvement.  No cortical encephalomalacia. Chronic lacunar infarct in the right globus pallidus.  Mild T2 heterogeneity for age in the deep gray matter nuclei elsewhere. Negative visualized cervical spine.  Visualized bone marrow signal is within normal limits.  Visualized orbit soft tissues are within normal limits.  Mastoids are clear. There is mucosal thickening in the right maxillary and ethmoid sinuses, the right maxillary is opacified.  Visualized scalp soft tissues are within normal limits.  IMPRESSION: 1. No acute intracranial abnormality. 2.  Advanced nonspecific signal changes in the white matter and deep cerebellar nuclei.  Favor chronic small vessel disease. 3.  MRA findings are below.  MRA HEAD WITHOUT CONTRAST  Technique: Angiographic images of the Circle of Willis were obtained using MRA technique without  intravenous contrast.  Findings: Antegrade flow in the posterior circulation.  Nondominant distal left vertebral artery functionally terminates in PICA. Right vertebral artery supplies the basilar.  No basilar artery stenosis.  Superior cerebellar arteries and PCA origins are patent. Posterior communicating arteries are diminutive or absent.  MOTSA artifact occurs at the level of the PCA course.  MIP images suggest bilateral PCA stenosis, but I feel this is related to be artifact.  Antegrade flow in both ICA siphons.  Cavernous segment irregularity but no significant  ICA stenosis.  Ophthalmic artery origins are within normal limits.  Normal carotid termini.  Dominant left ACA A1 segment.  Diminutive anterior communicating artery.  Visualized ACA branches are within normal limits.  Visualized bilateral MCA branches are also affected by the MOTSA  artifact.  No major MCA branch stenosis or occlusion identified.  IMPRESSION:  Negative for age intracranial MRA, with degradation of medium-sized branch detail due to MOTSA artifact.  Original Report Authenticated By: Harley Hallmark, M.D.   Mr Brain Wo Contrast  06/15/2011  *RADIOLOGY REPORT*  Clinical Data:  78 year old male with recent falls and weakness.  Comparison: Head CTs 06/15/2011 and earlier.  MRI HEAD WITHOUT CONTRAST  Technique: Multiplanar, multiecho pulse sequences of the brain and surrounding structures were obtained according to standard protocol without intravenous contrast.  Findings: No restricted diffusion to suggest acute infarction.  No midline shift, mass effect, evidence of mass lesion, ventriculomegaly, extra-axial collection or acute intracranial hemorrhage.  Cervicomedullary junction and pituitary are within normal limits.  Major intracranial vascular flow voids are preserved.  Confluent T2 and FLAIR hyperintensity throughout the cerebral white matter.  Temporal lobe involvement.  Comparatively mild white matter signal changes in the brainstem.  T2 and FLAIR hyperintensity also noted in the deep cerebellar nuclei.  No cerebellar peduncle involvement.  No cortical encephalomalacia. Chronic lacunar infarct in the right globus pallidus.  Mild T2 heterogeneity for age in the deep gray matter nuclei elsewhere. Negative visualized cervical spine.  Visualized bone marrow signal is within normal limits.  Visualized orbit soft tissues are within normal limits.  Mastoids are clear. There is mucosal thickening in the right maxillary and ethmoid sinuses, the right maxillary is opacified.  Visualized scalp soft tissues are within normal limits.  IMPRESSION: 1. No acute intracranial abnormality. 2.  Advanced nonspecific signal changes in the white matter and deep cerebellar nuclei.  Favor chronic small vessel disease. 3.  MRA findings are below.  MRA HEAD WITHOUT CONTRAST  Technique: Angiographic  images of the Circle of Willis were obtained using MRA technique without  intravenous contrast.  Findings: Antegrade flow in the posterior circulation.  Nondominant distal left vertebral artery functionally terminates in PICA. Right vertebral artery supplies the basilar.  No basilar artery stenosis.  Superior cerebellar arteries and PCA origins are patent. Posterior communicating arteries are diminutive or absent.  MOTSA artifact occurs at the level of the PCA course.  MIP images suggest bilateral PCA stenosis, but I feel this is related to be artifact.  Antegrade flow in both ICA siphons.  Cavernous segment irregularity but no significant ICA stenosis.  Ophthalmic artery origins are within normal limits.  Normal carotid termini.  Dominant left ACA A1 segment.  Diminutive anterior communicating artery.  Visualized ACA branches are within normal limits.  Visualized bilateral MCA branches are also affected by the MOTSA artifact.  No major MCA branch stenosis or occlusion identified.  IMPRESSION:  Negative for age intracranial MRA, with degradation of medium-sized branch detail due to MOTSA artifact.  Original Report Authenticated By: Harley Hallmark, M.D.   US Carotid Duplex Bilateral  06/15/2011  *RADIOLOGY REPORT*  Clinical Data: Stroke, history diabetes  BILATERAL CAROTID DUPLEX ULTRASOUND  Technique: Wallace Cullens scale imaging, color Doppler and duplex ultrasound was performed of bilateral carotid and vertebral arteries in the neck.  Comparison:  None  Criteria:  Quantification of carotid stenosis is based on velocity parameters that correlate the residual internal carotid diameter with NASCET-based stenosis levels, using the diameter of the distal internal carotid  lumen as the denominator for stenosis measurement.  The following velocity measurements were obtained:                   PEAK SYSTOLIC/END DIASTOLIC RIGHT ICA:                        70/11cm/sec CCA:                        107/4cm/sec SYSTOLIC ICA/CCA RATIO:      0.58 DIASTOLIC ICA/CCA RATIO:    2.75 ECA:                        125cm/sec  LEFT ICA:                        160/21cm/sec CCA:                        139/5cm/sec SYSTOLIC ICA/CCA RATIO:     1.01 DIASTOLIC ICA/CCA RATIO:    2.20 ECA:                        119cm/sec  Findings:  RIGHT CAROTID ARTERY: Tortuous right carotid system.  Intimal thickening right CCA.  Mild plaque right carotid bulb and bifurcation into proximal right ECA and ICA.  Turbulence in mid to distal right ICA at an area tortuosity with associated spectral broadening on waveform analysis.  No high velocity jets.  RIGHT VERTEBRAL ARTERY:  Patent, antegrade  LEFT CAROTID ARTERY: Tortuous left carotid system.  Intimal thickening left CCA.  Minimal plaque left carotid bifurcation. Turbulent flow distal left ICA beyond a marked area tortuosity, associated with spectral broadening on waveform analysis and increased peak systolic velocity.  LEFT VERTEBRAL ARTERY:  Patent, antegrade  IMPRESSION: Minimal plaque formation. Tortuous carotid systems bilaterally.  No evidence of hemodynamically significant stenosis. Elevated peak systolic velocity in left ICA appears to be related to the marked tortuosity of the left ICA, with no significant plaque formation seen at this site.  Original Report Authenticated By: Lollie Marrow, M.D.      Medications: I have reviewed the patient's current medications.  Impression: 1. Type 2 diabetes mellitus, uncontrolled on admission, now controlled. Hemoglobin A1c 12.2. 2. Acute sinusitis.     Plan: 1. Discontinue IV fluids. 2. Modified barium swallow. 3. Possible discharge home tomorrow with home health services.     LOS: 2 days   Wilson Singer Pager [phone]  06/17/2011, 10:25 AM

## 2011-06-18 ENCOUNTER — Inpatient Hospital Stay (HOSPITAL_COMMUNITY): Payer: PRIVATE HEALTH INSURANCE

## 2011-06-18 LAB — GLUCOSE, CAPILLARY
Glucose-Capillary: 194 mg/dL — ABNORMAL HIGH (ref 70–99)
Glucose-Capillary: 115 mg/dL — ABNORMAL HIGH (ref 70–99)
Glucose-Capillary: 145 mg/dL — ABNORMAL HIGH (ref 70–99)

## 2011-06-18 LAB — BASIC METABOLIC PANEL
BUN: 17 mg/dL (ref 6–23)
Chloride: 105 mEq/L (ref 96–112)
Creatinine, Ser: 0.75 mg/dL (ref 0.50–1.35)
GFR calc Af Amer: >90 mL/min (ref >90)
GFR calc non Af Amer: 84 mL/min — ABNORMAL LOW (ref >90)
Glucose, Bld: 161 mg/dL — ABNORMAL HIGH (ref 70–99)

## 2011-06-18 MED ORDER — POTASSIUM CHLORIDE CRYS ER 20 MEQ PO TBCR
40.0000 meq | EXTENDED_RELEASE_TABLET | Freq: Once | ORAL | Status: AC
  Administered 2011-06-18: 40 meq via ORAL
  Filled 2011-06-18: qty 2

## 2011-06-18 NOTE — Progress Notes (Signed)
Inpatient Diabetes Program Recommendations  AACE/ADA: New Consensus Statement on Inpatient Glycemic Control (2009)  Target Ranges:  Prepandial:   less than 140 mg/dL      Peak postprandial:   less than 180 mg/dL (1-2 hours)      Critically ill patients:  140 - 180 mg/dL   Reason for Visit: Elevated prandial glucose:  142, 194 mg/dL  Inpatient Diabetes Program Recommendations Oral Agents: Consider adding Amaryl 4 mg daily  Note: Also referred patient to [facility] [employer] [facility]. Patient's PCP is within network and has Medicare.  Elevated HgbA1C: 12.2%

## 2011-06-18 NOTE — Progress Notes (Signed)
Subjective: This man is much the same/stable compared to yesterday. He is awaiting modified barium swallow.          Physical Exam: Blood pressure 144/69, pulse 69, temperature 97.1 F (36.2 C), temperature source Oral, resp. rate 16, height 5\' 6"  (1.676 m), weight 56.1 kg (123 lb 10.9 oz), SpO2 98.00%. He looks systemically well. Heart sounds are present and normal. Lung fields are clear. There are no focal neurological signs whatsoever. He does not have any dysarthria now.   Investigations:     Basic Metabolic Panel:  Basename 06/18/11 0428 06/17/11 0551  NA 134* 137  K 3.2* 3.1*  CL 105 108  CO2 20 20  GLUCOSE 161* 108*  BUN 17 20  CREATININE 0.75 0.67  CALCIUM 8.2* 7.6*  MG -- --  PHOS -- --       CBC:  Basename 06/17/11 0551  WBC 8.0  NEUTROABS --  HGB 13.7  HCT 38.1*  MCV 88.8  PLT 159    No results found.    Medications: I have reviewed the patient's current medications.  Impression: 1. Type 2 diabetes mellitus, uncontrolled on admission, now controlled. Hemoglobin A1c 12.2. 2. Acute sinusitis. 3. Hypokalemia.     Plan: 1. Replete potassium. 2. Modified barium swallow. 3. Possible discharge home tomorrow with home health services.     LOS: 3 days   Wilson Singer Pager [phone]  06/18/2011, 10:08 AM

## 2011-06-18 NOTE — Procedures (Signed)
Modified Barium Swallow Procedure Note Patient Details  Name: Charles Gibbs MRN: [id number] Date of Birth: Dec 31, 1927  Today's Date: 06/18/2011 Time:  - 11:36-12:10    Past Medical History:  Past Medical History  Diagnosis Date  . Diabetes mellitus   . Inner ear inflammation    Past Surgical History: History reviewed. No pertinent past surgical history.  HPI:  78 yo male initially admitted for dysarthria with concern for possible CVA.  His MRI brain is negative for any acute infarct.  His speech has significantly improved today.  Other work up including cxr, ua, tsh were found to be negative. Patient was dehydrated on admission.  This may have been playing a role in his symptoms.  BSE was completed on 06/16/2011 and MBSS was recommended due to pt with intermittent coughing after straw sips thin liquids. Today, pt tells me that he has occasionally coughed with liquids for "as long as (he) can remember".     Recommendation/Prognosis  Clinical Impression Dysphagia Diagnosis: Within Functional Limits Clinical impression: Essentially WFL, piecemeal deglutition with solids and liquids, premature spillage to pyriforms with liquids. No penetration or aspiration observed with regular textures, thin liquids, puree, and pill/thin. Pt generally swallows two times for every bite/sip. Swallow Evaluation Recommendations Solid Consistency: Regular Liquid Consistency: Thin Liquid Administration via: Cup;Straw Medication Administration: Whole meds with liquid Supervision: Patient able to self feed Postural Changes and/or Swallow Maneuvers: Seated upright 90 degrees;Upright 30-60 min after meal Oral Care Recommendations: Oral care BID;Patient independent with oral care Other Recommendations: Clarify dietary restrictions Follow up Recommendations: None (Pt noted to have some working memory deficits during convers)   Individuals Consulted Consulted and Agree with Results and Recommendations:  Patient;RN  SLP Assessment/Plan  Continue diet as ordered. No further dysphagia intervention indicated at this time.  SLP Goals   N/A  General:  Date of Onset: 06/16/11 HPI: 78 yo male initially admitted for dysarthria with concern for possible CVA.  His MRI brain is negative for any acute infarct.  His speech has significantly improved today.  Other work up including cxr, ua, tsh were found to be negative. Patient was dehydrated on admission.  This may have been playing a role in his symptoms.  BSE was completed on 06/16/2011 and MBSS was recommended due to pt with intermittent coughing after straw sips thin liquids. Today, pt tells me that he has occasionally coughed with liquids for "as long as (he) can remember". Type of Study: Initial MBS Diet Prior to this Study: Regular;Thin liquids Temperature Spikes Noted: No Respiratory Status: Room air History of Intubation: No Behavior/Cognition: Alert;Cooperative Oral Cavity - Dentition: Adequate natural dentition (Missing some dentition, chews primarily on right side) Oral Motor / Sensory Function: Within functional limits Vision: Functional for self-feeding Patient Positioning: Upright in chair Baseline Vocal Quality: Clear Volitional Cough: Strong Volitional Swallow: Able to elicit Anatomy: Within functional limits Pharyngeal Secretions: Not observed secondary MBS  Reason for Referral:  Objectively evaluate swallow function due to concerns of possible aspiration and identify appropriate diet and compensatory strategies as needed.  Oral Phase  Oral Preparation/Oral Phase Oral Phase: WFL (Piecemeal deglutition)  Pharyngeal Phase  Pharyngeal Phase Pharyngeal Phase: Impaired Pharyngeal - Thin Pharyngeal - Thin Cup: Premature spillage to valleculae;Premature spillage to pyriform Pharyngeal - Thin Straw: Premature spillage to valleculae;Premature spillage to pyriform Pharyngeal Phase - Comment Pharyngeal Comment: Pharyngeal swallow  essentially WFL except for premature spillage to the level of the pyriforms. No penetration or aspiration observed.  Cervical Esophageal Phase  Cervical Esophageal Phase Cervical Esophageal Phase: [facility]  Thank you for this referral, Charles Gibbs,[id number]-[phone]   Charles Gibbs 06/18/2011, 12:36 PM

## 2011-06-19 LAB — COMPREHENSIVE METABOLIC PANEL
AST: 19 U/L (ref 0–37)
Albumin: 2.4 g/dL — ABNORMAL LOW (ref 3.5–5.2)
Alkaline Phosphatase: 64 U/L (ref 39–117)
BUN: 18 mg/dL (ref 6–23)
Potassium: 3.8 mEq/L (ref 3.5–5.1)
Total Protein: 5.9 g/dL — ABNORMAL LOW (ref 6.0–8.3)

## 2011-06-19 LAB — URINE CULTURE

## 2011-06-19 LAB — CBC
HCT: 40.3 % (ref 39.0–52.0)
MCHC: 35.7 g/dL (ref 30.0–36.0)
Platelets: 193 10*3/uL (ref 150–400)
RDW: 12.3 % (ref 11.5–15.5)
WBC: 6.5 10*3/uL (ref 4.0–10.5)

## 2011-06-19 LAB — GLUCOSE, CAPILLARY

## 2011-06-19 MED ORDER — SODIUM CHLORIDE 0.9 % IJ SOLN
INTRAMUSCULAR | Status: AC
  Filled 2011-06-19: qty 3

## 2011-06-19 MED ORDER — GLIPIZIDE 5 MG PO TABS: 5.0000 mg | ORAL_TABLET | Freq: Two times a day (BID) | ORAL | Status: DC

## 2011-06-19 NOTE — Progress Notes (Signed)
Pt discharged home today per Dr. Karilyn Cota. Pt's IV site D/C'd and WNL. PT's VS stable at this time. Pt provided with home medication list, discharge instructions and prescriptions. Pt verbalized understanding. Pt left floor via WC in stable condition accompanied by RN and daughter.

## 2011-06-19 NOTE — Discharge Summary (Signed)
Physician Discharge Summary  Patient ID: Charles Gibbs MRN: [id number] DOB/AGE: [id number] 76 y.o. Primary Care Physician:MOORE,DONALD Andrey Campanile, MD, MD Admit date: 06/15/2011 Discharge date: 06/19/2011    Discharge Diagnoses:  1. Uncontrolled diabetes mellitus 2. Dehydration. 3. Dysarthria likely secondary to 1 and 2. 4. Acute sinusitis. 5. Gait abnormality with deconditioning.   Medication List  As of 06/19/2011  9:47 AM   TAKE these medications         aspirin 325 MG tablet   Take 325 mg by mouth as needed. Takes occasionally as needed for pain      glipiZIDE 5 MG tablet   Commonly known as: GLUCOTROL   Take 1 tablet (5 mg total) by mouth 2 (two) times daily before a meal.      One-A-Day 50 Plus Tabs   Take 1 tablet by mouth at bedtime.            Discharged Condition: Improved and stable.    Consults: None.  Significant Diagnostic Studies: Ct Head Wo Contrast  06/15/2011  *RADIOLOGY REPORT*  Clinical Data: Status post fall; unknown loss of consciousness. Concern for head injury.  CT HEAD WITHOUT CONTRAST  Technique:  Contiguous axial images were obtained from the base of the skull through the vertex without contrast.  Comparison: CT of the head performed 12/20/2009  Findings: There is no evidence of acute infarction, mass lesion, or intra- or extra-axial hemorrhage on CT.  Diffuse periventricular and subcortical white matter change likely reflects small vessel ischemic microangiopathy.  Mild cortical volume loss is noted.  Cerebellar atrophy is seen.  Chronic lacunar infarcts are seen within the basal ganglia bilaterally; a chronic right globus pallidus infarct appears new from 2011.  The brainstem and fourth ventricle are within normal limits.  The cerebral hemispheres demonstrate grossly normal gray-white differentiation.  No mass effect or midline shift is seen.  There is no evidence of fracture; visualized osseous structures are unremarkable in appearance.  The orbits  are within normal limits. There is near-complete opacification of the right maxillary sinus; the paranasal sinuses and mastoid air cells are well-aerated.  No significant soft tissue abnormalities are seen.  Cerumen is noted within the external auditory canals bilaterally, particularly on the left.  IMPRESSION:  1.  No evidence of traumatic intracranial injury or fracture. 2.  Mild cortical volume loss and diffuse small vessel ischemic microangiopathy. 3.  Chronic lacunar infarcts within the basal ganglia bilaterally. 4.  Near-complete opacification of the right maxillary sinus. 5.  Cerumen noted within the external auditory canals bilaterally, particularly on the left.  Original Report Authenticated By: Tonia Ghent, M.D.   Mr Maxine Glenn Head Wo Contrast  06/15/2011  *RADIOLOGY REPORT*  Clinical Data:  78 year old male with recent falls and weakness.  Comparison: Head CTs 06/15/2011 and earlier.  MRI HEAD WITHOUT CONTRAST  Technique: Multiplanar, multiecho pulse sequences of the brain and surrounding structures were obtained according to standard protocol without intravenous contrast.  Findings: No restricted diffusion to suggest acute infarction.  No midline shift, mass effect, evidence of mass lesion, ventriculomegaly, extra-axial collection or acute intracranial hemorrhage.  Cervicomedullary junction and pituitary are within normal limits.  Major intracranial vascular flow voids are preserved.  Confluent T2 and FLAIR hyperintensity throughout the cerebral white matter.  Temporal lobe involvement.  Comparatively mild white matter signal changes in the brainstem.  T2 and FLAIR hyperintensity also noted in the deep cerebellar nuclei.  No cerebellar peduncle involvement.  No cortical encephalomalacia. Chronic lacunar infarct in the right  globus pallidus.  Mild T2 heterogeneity for age in the deep gray matter nuclei elsewhere. Negative visualized cervical spine.  Visualized bone marrow signal is within normal limits.   Visualized orbit soft tissues are within normal limits.  Mastoids are clear. There is mucosal thickening in the right maxillary and ethmoid sinuses, the right maxillary is opacified.  Visualized scalp soft tissues are within normal limits.  IMPRESSION: 1. No acute intracranial abnormality. 2.  Advanced nonspecific signal changes in the white matter and deep cerebellar nuclei.  Favor chronic small vessel disease. 3.  MRA findings are below.  MRA HEAD WITHOUT CONTRAST  Technique: Angiographic images of the Circle of Willis were obtained using MRA technique without  intravenous contrast.  Findings: Antegrade flow in the posterior circulation.  Nondominant distal left vertebral artery functionally terminates in PICA. Right vertebral artery supplies the basilar.  No basilar artery stenosis.  Superior cerebellar arteries and PCA origins are patent. Posterior communicating arteries are diminutive or absent.  MOTSA artifact occurs at the level of the PCA course.  MIP images suggest bilateral PCA stenosis, but I feel this is related to be artifact.  Antegrade flow in both ICA siphons.  Cavernous segment irregularity but no significant ICA stenosis.  Ophthalmic artery origins are within normal limits.  Normal carotid termini.  Dominant left ACA A1 segment.  Diminutive anterior communicating artery.  Visualized ACA branches are within normal limits.  Visualized bilateral MCA branches are also affected by the MOTSA artifact.  No major MCA branch stenosis or occlusion identified.  IMPRESSION:  Negative for age intracranial MRA, with degradation of medium-sized branch detail due to MOTSA artifact.  Original Report Authenticated By: Harley Hallmark, M.D.   Mr Brain Wo Contrast  06/15/2011  *RADIOLOGY REPORT*  Clinical Data:  78 year old male with recent falls and weakness.  Comparison: Head CTs 06/15/2011 and earlier.  MRI HEAD WITHOUT CONTRAST  Technique: Multiplanar, multiecho pulse sequences of the brain and surrounding  structures were obtained according to standard protocol without intravenous contrast.  Findings: No restricted diffusion to suggest acute infarction.  No midline shift, mass effect, evidence of mass lesion, ventriculomegaly, extra-axial collection or acute intracranial hemorrhage.  Cervicomedullary junction and pituitary are within normal limits.  Major intracranial vascular flow voids are preserved.  Confluent T2 and FLAIR hyperintensity throughout the cerebral white matter.  Temporal lobe involvement.  Comparatively mild white matter signal changes in the brainstem.  T2 and FLAIR hyperintensity also noted in the deep cerebellar nuclei.  No cerebellar peduncle involvement.  No cortical encephalomalacia. Chronic lacunar infarct in the right globus pallidus.  Mild T2 heterogeneity for age in the deep gray matter nuclei elsewhere. Negative visualized cervical spine.  Visualized bone marrow signal is within normal limits.  Visualized orbit soft tissues are within normal limits.  Mastoids are clear. There is mucosal thickening in the right maxillary and ethmoid sinuses, the right maxillary is opacified.  Visualized scalp soft tissues are within normal limits.  IMPRESSION: 1. No acute intracranial abnormality. 2.  Advanced nonspecific signal changes in the white matter and deep cerebellar nuclei.  Favor chronic small vessel disease. 3.  MRA findings are below.  MRA HEAD WITHOUT CONTRAST  Technique: Angiographic images of the Circle of Willis were obtained using MRA technique without  intravenous contrast.  Findings: Antegrade flow in the posterior circulation.  Nondominant distal left vertebral artery functionally terminates in PICA. Right vertebral artery supplies the basilar.  No basilar artery stenosis.  Superior cerebellar arteries and PCA origins are  patent. Posterior communicating arteries are diminutive or absent.  MOTSA artifact occurs at the level of the PCA course.  MIP images suggest bilateral PCA stenosis,  but I feel this is related to be artifact.  Antegrade flow in both ICA siphons.  Cavernous segment irregularity but no significant ICA stenosis.  Ophthalmic artery origins are within normal limits.  Normal carotid termini.  Dominant left ACA A1 segment.  Diminutive anterior communicating artery.  Visualized ACA branches are within normal limits.  Visualized bilateral MCA branches are also affected by the MOTSA artifact.  No major MCA branch stenosis or occlusion identified.  IMPRESSION:  Negative for age intracranial MRA, with degradation of medium-sized branch detail due to MOTSA artifact.  Original Report Authenticated By: Harley Hallmark, M.D.   US Carotid Duplex Bilateral  06/15/2011  *RADIOLOGY REPORT*  Clinical Data: Stroke, history diabetes  BILATERAL CAROTID DUPLEX ULTRASOUND  Technique: Wallace Cullens scale imaging, color Doppler and duplex ultrasound was performed of bilateral carotid and vertebral arteries in the neck.  Comparison:  None  Criteria:  Quantification of carotid stenosis is based on velocity parameters that correlate the residual internal carotid diameter with NASCET-based stenosis levels, using the diameter of the distal internal carotid lumen as the denominator for stenosis measurement.  The following velocity measurements were obtained:                   PEAK SYSTOLIC/END DIASTOLIC RIGHT ICA:                        70/11cm/sec CCA:                        107/4cm/sec SYSTOLIC ICA/CCA RATIO:     0.58 DIASTOLIC ICA/CCA RATIO:    2.75 ECA:                        125cm/sec  LEFT ICA:                        160/21cm/sec CCA:                        139/5cm/sec SYSTOLIC ICA/CCA RATIO:     1.01 DIASTOLIC ICA/CCA RATIO:    2.20 ECA:                        119cm/sec  Findings:  RIGHT CAROTID ARTERY: Tortuous right carotid system.  Intimal thickening right CCA.  Mild plaque right carotid bulb and bifurcation into proximal right ECA and ICA.  Turbulence in mid to distal right ICA at an area tortuosity with  associated spectral broadening on waveform analysis.  No high velocity jets.  RIGHT VERTEBRAL ARTERY:  Patent, antegrade  LEFT CAROTID ARTERY: Tortuous left carotid system.  Intimal thickening left CCA.  Minimal plaque left carotid bifurcation. Turbulent flow distal left ICA beyond a marked area tortuosity, associated with spectral broadening on waveform analysis and increased peak systolic velocity.  LEFT VERTEBRAL ARTERY:  Patent, antegrade  IMPRESSION: Minimal plaque formation. Tortuous carotid systems bilaterally.  No evidence of hemodynamically significant stenosis. Elevated peak systolic velocity in left ICA appears to be related to the marked tortuosity of the left ICA, with no significant plaque formation seen at this site.  Original Report Authenticated By: Lollie Marrow, M.D.   Dg Chest Port 1 View  06/15/2011  *RADIOLOGY  REPORT*  Clinical Data: Status post fall; concern for chest injury.  PORTABLE CHEST - 1 VIEW  Comparison: Chest radiograph performed 12/20/2009  Findings: The lungs are well-aerated and clear.  There is no evidence of focal opacification, pleural effusion or pneumothorax.  The cardiomediastinal silhouette is within normal limits.  No acute osseous abnormalities are seen.  IMPRESSION: No acute cardiopulmonary process seen; no displaced rib fractures identified.  Original Report Authenticated By: Tonia Ghent, M.D.   Dg Swallowing Func-no Report  06/18/2011  CLINICAL DATA: DYSPHAGIA   FLUOROSCOPY FOR SWALLOWING FUNCTION STUDY:  Fluoroscopy was provided for swallowing function study, which was  administered by a speech pathologist.  Final results and recommendations  from this study are contained within the speech pathology report.      Lab Results: Basic Metabolic Panel:  Basename 06/19/11 0519 06/18/11 0428  NA 139 134*  K 3.8 3.2*  CL 108 105  CO2 22 20  GLUCOSE 121* 161*  BUN 18 17  CREATININE 0.69 0.75  CALCIUM 8.6 8.2*  MG -- --  PHOS -- --   Liver Function  Tests:  [facility] 06/19/11 0519  AST 19  ALT 13  ALKPHOS 64  BILITOT 0.2*  PROT 5.9*  ALBUMIN 2.4*     CBC:  Basename 06/19/11 0519 06/17/11 0551  WBC 6.5 8.0  NEUTROABS -- --  HGB 14.4 13.7  HCT 40.3 38.1*  MCV 88.6 88.8  PLT 193 159    Recent Results (from the past 240 hour(s))  URINE CULTURE     Status: Normal   Collection Time   06/15/11  6:14 AM      Component Value Range Status Comment   Specimen Description URINE, CATHETERIZED   Final    Special Requests NONE   Final    Culture  Setup Time [id number]   Final    Colony Count 5,000 COLONIES/ML   Final    Culture INSIGNIFICANT GROWTH   Final    Report Status 06/19/2011 FINAL   Final      Hospital Course: This 78 year old man was admitted with symptoms of a fall secondary to weakness. There was no head injury. He was noted to have significant dysarthria in the emergency room. The suspicion was that he had had a stroke. He normally is able to carry out all activities of daily living and is quite independent, paying his own bills and doing his own shopping. He no longer drives a vehicle but is otherwise functional and ambulates independently. He was admitted to the hospital with the assumption that this man had had a stroke. However MRI scanning did not confirm the presence of any stroke. As his diabetes, which was uncontrolled on admission and dehydration which was also present, improved, his symptoms of dysarthria also improved. He was able to undergo physical therapy and demonstrated generalized weakness but strength acceptable to allow mobility. Physical therapy recommended home health physical therapy. There was also concern about his swallowing and he underwent a modified barium swallow. This showed that he was within functional limits and the recommendation was of a regular consistency foods and thin liquid consistency. He is feeling improved now that his sugars are better control. He has been started on glipizide. It is  not clear whether he was taken any medications for his diabetes prior to hospitalization. His hemoglobin A1c was 12.2%.  Discharge Exam: Blood pressure 145/74, pulse 68, temperature 98 F (36.7 C), temperature source Oral, resp. rate 20, height 5\' 6"  (1.676 m), weight 56.1  kg (123 lb 10.9 oz), SpO2 99.00%. He looks systemically well. Heart sounds are present and normal. Lung fields are clear. He is alert and orientated. There are no obvious focal neurological signs.  Disposition: Home with home health services. He will need physical therapy and registered nurse to check on his diabetes. He'll follow with his primary care physician.  Discharge Orders    Future Orders Please Complete By Expires   Diet - low sodium heart healthy      Increase activity slowly         Follow-up Information    Follow up with Monica Becton, MD .         SignedWilson Singer Pager [phone]  06/19/2011, 9:47 AM

## 2011-06-19 NOTE — Progress Notes (Signed)
Physical Therapy Treatment Patient Details Name: Charles Gibbs MRN: [id number] DOB: 1927/09/15 Today's Date: 06/19/2011  PT Assessment/Plan  PT - Assessment/Plan Comments on Treatment Session: pt is generally deconditioned but should be able to transition to home at d/c with HHPT ...all acute care PT gooals are met PT Plan: All goals met and education completed, patient dischaged from PT services Follow Up Recommendations: Home health PT Equipment Recommended: Defer to next venue PT Goals  Acute Rehab PT Goals PT Goal: Supine/Side to Sit - Progress: Met PT Goal: Ambulate - Progress: Met PT Goal: Up/Down Stairs - Progress: Met  PT Treatment Precautions/Restrictions  Restrictions Weight Bearing Restrictions: No Mobility (including Balance) Bed Mobility Supine to Sit: 6: Modified independent (Device/Increase time) Transfers Sit to Stand: 7: Independent Stand to Sit: 7: Independent Ambulation/Gait Ambulation/Gait Assistance: 6: Modified independent (Device/Increase time) Ambulation Distance (Feet): 200 Feet Assistive device: None Gait Pattern: Within Functional Limits Stairs: Yes Stairs Assistance: 6: Modified independent (Device/Increase time) Stair Management Technique: One rail Right;Step to pattern;Forwards Number of Stairs: 12  Wheelchair Mobility Wheelchair Mobility: No    Exercise  General Exercises - Lower Extremity Ankle Circles/Pumps: AROM;Both;10 reps;Supine Quad Sets: AROM;Both;10 reps;Supine Gluteal Sets: AROM;Both;10 reps;Supine Heel Slides: AROM;Both;10 reps;Supine Hip ABduction/ADduction: AROM;Both;10 reps;Supine End of Session PT - End of Session Equipment Utilized During Treatment: Gait belt Activity Tolerance: Patient tolerated treatment well Patient left: in chair;with call bell in reach;with bed alarm set General Behavior During Session: [facility] for tasks performed Cognition: [facility] for tasks performed  [name] 06/19/2011, 9:54 AM

## 2011-06-19 NOTE — Progress Notes (Signed)
CARE MANAGEMENT NOTE 06/19/2011  Patient:  Charles Gibbs, Charles Gibbs   Account Number:  [id number]  Date Initiated:  06/18/2011  Documentation initiated by:  Andi Devon Assessment:   78 yr old male  admitted s/p fall lives alone  pt has life alert daughter pt states daughter checks on him every week     Action/Plan:   Anticipated DC Date:  06/19/2011   Anticipated DC Plan:  HOME W HOME HEALTH SERVICES      DC Planning Services  CM consult      [facility] Choice  HOME HEALTH   Choice offered to / List presented to:  C-1 Patient        HH arranged  HH-1 RN  HH-2 PT  HH-4 NURSE'S AIDE  HH-6 SOCIAL WORKER      HH agency  [facility]   Status of service:   Medicare Important Message given?   (If response is "NO", the following Medicare IM given date fields will be blank) Date Medicare IM given:   Date Additional Medicare IM given:    Discharge Disposition:  HOME W HOME HEALTH SERVICES  Per UR Regulation:    Comments:  06/19/2011 Mendel Corning rn bsn pt d/c home today arranged above home health services

## 2011-06-20 NOTE — Progress Notes (Signed)
Patient referred to [facility] Care Management for RN CM services per [facility] Diabetes Coordinator.  Charles Gibbs discharged from inpatient services prior to consultation.  He will be scheduled for a transition of care call to determine his service eligibility and desire to participate.

## 2011-06-27 ENCOUNTER — Emergency Department (HOSPITAL_COMMUNITY)
Admission: EM | Admit: 2011-06-27 | Discharge: 2011-06-27 | Disposition: A | Discharge status: Home / Self Care | Payer: PRIVATE HEALTH INSURANCE | Attending: Emergency Medicine | Admitting: Emergency Medicine

## 2011-06-27 ENCOUNTER — Encounter (HOSPITAL_COMMUNITY): Payer: Self-pay | Admitting: Emergency Medicine

## 2011-06-27 DIAGNOSIS — Z7982 Long term (current) use of aspirin: Secondary | ICD-10-CM | POA: Insufficient documentation

## 2011-06-27 DIAGNOSIS — E119 Type 2 diabetes mellitus without complications: Secondary | ICD-10-CM | POA: Insufficient documentation

## 2011-06-27 DIAGNOSIS — Z79899 Other long term (current) drug therapy: Secondary | ICD-10-CM | POA: Insufficient documentation

## 2011-06-27 DIAGNOSIS — N390 Urinary tract infection, site not specified: Secondary | ICD-10-CM | POA: Insufficient documentation

## 2011-06-27 LAB — URINALYSIS, ROUTINE W REFLEX MICROSCOPIC
Leukocytes, UA: NEGATIVE
Nitrite: POSITIVE — AB
Specific Gravity, Urine: 1.02 (ref 1.005–1.030)
pH: 7.5 (ref 5.0–8.0)

## 2011-06-27 LAB — BASIC METABOLIC PANEL
Chloride: 103 mEq/L (ref 96–112)
Creatinine, Ser: 0.79 mg/dL (ref 0.50–1.35)
GFR calc Af Amer: >90 mL/min (ref >90)
Potassium: 4 mEq/L (ref 3.5–5.1)
Sodium: 137 mEq/L (ref 135–145)

## 2011-06-27 LAB — URINE MICROSCOPIC-ADD ON

## 2011-06-27 LAB — CBC
Platelets: 230 10*3/uL (ref 150–400)
RDW: 12.4 % (ref 11.5–15.5)
WBC: 6.7 10*3/uL (ref 4.0–10.5)

## 2011-06-27 MED ORDER — CEFPODOXIME PROXETIL 200 MG PO TABS: 200.0000 mg | ORAL_TABLET | Freq: Two times a day (BID) | ORAL | Status: AC

## 2011-06-27 MED ORDER — DEXTROSE 5 % IV SOLN
1.0000 g | Freq: Once | INTRAVENOUS | Status: AC
  Administered 2011-06-27: 1 g via INTRAVENOUS
  Filled 2011-06-27: qty 10

## 2011-06-27 NOTE — ED Provider Notes (Signed)
History     CSN: [id number]  Arrival date & time 06/27/11  0348   First MD Initiated Contact with Patient 06/27/11 [phone]      Chief Complaint  Patient presents with  . Hematuria    (Consider location/radiation/quality/duration/timing/severity/associated sxs/prior treatment) Patient is a 78 y.o. male presenting with hematuria. The history is provided by the patient.  Hematuria This is a new problem. The current episode started today. The problem is unchanged. He describes the hematuria as gross hematuria. The hematuria occurs throughout his entire urinary stream. He is experiencing no pain. He describes his urine color as dark red. Irritative symptoms do not include frequency. Obstructive symptoms do not include straining or a weak stream. Pertinent negatives include no abdominal pain, chills, dysuria, fever, genital pain, inability to urinate, nausea or vomiting. His sexual activity is non-contributory to the current illness. There is no history of kidney stones or recent infection.   Patient had in and out cath a week ago and denies any issues since that time. Tonight he noticed blood in his urine with some clots but denies any pain or urinary retention. No history of hematuria in the past. No fevers or back pain. No abdominal pain. No nausea or vomiting. Patient able to make urine in the emergency department without difficulty. He denies any history of tobacco use. No history of kidney problems. No known history of cancer. Moderate in severity.  Past Medical History  Diagnosis Date  . Diabetes mellitus   . Inner ear inflammation     History reviewed. No pertinent past surgical history.  History reviewed. No pertinent family history.  History  Substance Use Topics  . Smoking status: Never Smoker   . Smokeless tobacco: Not on file  . Alcohol Use: No      Review of Systems  Constitutional: Negative for fever and chills.  HENT: Negative for sore throat, neck pain and neck  stiffness.   Eyes: Negative for pain.  Respiratory: Negative for shortness of breath.   Cardiovascular: Negative for chest pain.  Gastrointestinal: Negative for nausea, vomiting and abdominal pain.  Genitourinary: Positive for hematuria. Negative for dysuria and frequency.  Musculoskeletal: Negative for back pain.  Skin: Negative for rash.  Neurological: Negative for headaches.  All other systems reviewed and are negative.    Allergies  Review of patient's allergies indicates no known allergies.  Home Medications   Current Outpatient Rx  Name Route Sig Dispense Refill  . ASPIRIN 325 MG PO TABS Oral Take 325 mg by mouth as needed. Takes occasionally as needed for pain    . GLIPIZIDE 5 MG PO TABS Oral Take 1 tablet (5 mg total) by mouth 2 (two) times daily before a meal. 60 tablet 0  . ONE-A-DAY 50 PLUS PO TABS Oral Take 1 tablet by mouth at bedtime.      BP 137/71  Pulse 73  Temp(Src) 97.7 F (36.5 C) (Oral)  Resp 14  Ht 5\' 5"  (1.651 m)  Wt 131 lb (59.421 kg)  BMI 21.80 kg/m2  SpO2 100%  Physical Exam  Constitutional: He is oriented to person, place, and time. He appears well-developed and well-nourished.  HENT:  Head: Normocephalic and atraumatic.  Eyes: Conjunctivae and EOM are normal. Pupils are equal, round, and reactive to light.  Neck: Trachea normal. Neck supple. No thyromegaly present.  Cardiovascular: Normal rate, regular rhythm, S1 normal, S2 normal and normal pulses.     No systolic murmur is present   No diastolic murmur is  present  Pulses:      Radial pulses are 2+ on the right side, and 2+ on the left side.  Pulmonary/Chest: Effort normal and breath sounds normal. He has no wheezes. He has no rhonchi. He has no rales. He exhibits no tenderness.  Abdominal: Soft. Normal appearance and bowel sounds are normal. He exhibits no mass. There is no tenderness. There is no rebound, no guarding, no CVA tenderness and negative Murphy's sign.       No suprapubic  tenderness or fullness  Musculoskeletal: Normal range of motion. He exhibits no edema.       BLE:s Calves nontender, no cords or erythema, negative Homans sign  Neurological: He is alert and oriented to person, place, and time. He has normal strength. No cranial nerve deficit or sensory deficit. GCS eye subscore is 4. GCS verbal subscore is 5. GCS motor subscore is 6.  Skin: Skin is warm and dry. No rash noted. He is not diaphoretic.  Psychiatric: His speech is normal.       Cooperative and appropriate    ED Course  Procedures (including critical care time)  Labs Reviewed  URINALYSIS, ROUTINE W REFLEX MICROSCOPIC - Abnormal; Notable for the following:    Color, Urine RED (*) BIOCHEMICALS MAY BE AFFECTED BY COLOR   APPearance CLOUDY (*)    Glucose, UA 500 (*)    Hgb urine dipstick LARGE (*)    Ketones, ur TRACE (*)    Protein, ur 100 (*)    Nitrite POSITIVE (*)    All other components within normal limits  URINE MICROSCOPIC-ADD ON - Abnormal; Notable for the following:    Bacteria, UA MANY (*)    All other components within normal limits  CBC - Abnormal; Notable for the following:    HCT 38.5 (*)    All other components within normal limits  BASIC METABOLIC PANEL - Abnormal; Notable for the following:    Glucose, Bld 228 (*)    GFR calc non Af Amer 82 (*)    All other components within normal limits  URINE CULTURE   UA obtained and reviewed with nitrite positive urine as above. Rocephin provided. Urine culture sent. Serial examinations without any clinical urinary retention.    MDM   Hematuria with UTI. IV antibiotics and prescription for Vantin provided. Referral to urology. He states understanding strict return precautions for any pain, fever or worsening condition.labs reviewed as above.        Sunnie Nielsen, MD 06/27/11 [phone]

## 2011-06-27 NOTE — ED Notes (Signed)
MD at bedside. 

## 2011-06-27 NOTE — ED Notes (Signed)
Patient was assisted in getting dressed.  Patient ready for discharge.

## 2011-06-27 NOTE — ED Notes (Signed)
Pt reports having a in and out cath placed 1 week ago, pt reports pain since. Pt states he noticed bright red blood with clumps in his urine yesterday morning. Pt last voided at 0300 this morning.

## 2011-06-28 LAB — URINE CULTURE
Colony Count: NO GROWTH
Culture  Setup Time: 201302061403

## 2011-12-26 ENCOUNTER — Emergency Department (HOSPITAL_COMMUNITY): Payer: PRIVATE HEALTH INSURANCE

## 2011-12-26 ENCOUNTER — Inpatient Hospital Stay (HOSPITAL_COMMUNITY): Payer: PRIVATE HEALTH INSURANCE

## 2011-12-26 ENCOUNTER — Observation Stay (HOSPITAL_COMMUNITY)
Admission: EM | Admit: 2011-12-26 | Discharge: 2011-12-28 | Disposition: A | Discharge status: Home Health | Payer: PRIVATE HEALTH INSURANCE | Attending: Internal Medicine | Admitting: Internal Medicine

## 2011-12-26 ENCOUNTER — Encounter (HOSPITAL_COMMUNITY): Payer: Self-pay | Admitting: Emergency Medicine

## 2011-12-26 DIAGNOSIS — E860 Dehydration: Principal | ICD-10-CM

## 2011-12-26 DIAGNOSIS — M25529 Pain in unspecified elbow: Secondary | ICD-10-CM | POA: Insufficient documentation

## 2011-12-26 DIAGNOSIS — R5383 Other fatigue: Secondary | ICD-10-CM | POA: Insufficient documentation

## 2011-12-26 DIAGNOSIS — Y92009 Unspecified place in unspecified non-institutional (private) residence as the place of occurrence of the external cause: Secondary | ICD-10-CM | POA: Insufficient documentation

## 2011-12-26 DIAGNOSIS — E119 Type 2 diabetes mellitus without complications: Secondary | ICD-10-CM

## 2011-12-26 DIAGNOSIS — IMO0002 Reserved for concepts with insufficient information to code with codable children: Secondary | ICD-10-CM | POA: Diagnosis present

## 2011-12-26 DIAGNOSIS — R5381 Other malaise: Secondary | ICD-10-CM | POA: Insufficient documentation

## 2011-12-26 DIAGNOSIS — R739 Hyperglycemia, unspecified: Secondary | ICD-10-CM

## 2011-12-26 DIAGNOSIS — S0990XA Unspecified injury of head, initial encounter: Secondary | ICD-10-CM | POA: Insufficient documentation

## 2011-12-26 DIAGNOSIS — R6251 Failure to thrive (child): Secondary | ICD-10-CM

## 2011-12-26 DIAGNOSIS — E1165 Type 2 diabetes mellitus with hyperglycemia: Secondary | ICD-10-CM | POA: Diagnosis present

## 2011-12-26 DIAGNOSIS — R471 Dysarthria and anarthria: Secondary | ICD-10-CM

## 2011-12-26 DIAGNOSIS — R51 Headache: Secondary | ICD-10-CM | POA: Insufficient documentation

## 2011-12-26 DIAGNOSIS — M6282 Rhabdomyolysis: Secondary | ICD-10-CM | POA: Insufficient documentation

## 2011-12-26 DIAGNOSIS — W19XXXA Unspecified fall, initial encounter: Secondary | ICD-10-CM

## 2011-12-26 LAB — CBC WITH DIFFERENTIAL/PLATELET
Basophils Absolute: 0 10*3/uL (ref 0.0–0.1)
Basophils Relative: 0 % (ref 0–1)
HCT: 44.1 % (ref 39.0–52.0)
Hemoglobin: 15.9 g/dL (ref 13.0–17.0)
Lymphocytes Relative: 12 % (ref 12–46)
Monocytes Absolute: 0.8 10*3/uL (ref 0.1–1.0)
Neutro Abs: 8.5 10*3/uL — ABNORMAL HIGH (ref 1.7–7.7)
Neutrophils Relative %: 80 % — ABNORMAL HIGH (ref 43–77)
RDW: 13 % (ref 11.5–15.5)
WBC: 10.6 10*3/uL — ABNORMAL HIGH (ref 4.0–10.5)

## 2011-12-26 LAB — URINALYSIS, ROUTINE W REFLEX MICROSCOPIC
Bilirubin Urine: NEGATIVE
Ketones, ur: 15 mg/dL — AB
Leukocytes, UA: NEGATIVE
Nitrite: NEGATIVE
Protein, ur: 30 mg/dL — AB
Urobilinogen, UA: 0.2 mg/dL (ref 0.0–1.0)
pH: 7 (ref 5.0–8.0)

## 2011-12-26 LAB — URINE MICROSCOPIC-ADD ON

## 2011-12-26 LAB — CARDIAC PANEL(CRET KIN+CKTOT+MB+TROPI)
CK, MB: 4.8 ng/mL — ABNORMAL HIGH (ref 0.3–4.0)
Relative Index: 1.1 (ref 0.0–2.5)
Relative Index: 1 (ref 0.0–2.5)
Troponin I: <0.3 ng/mL (ref <0.30)
Troponin I: <0.3 ng/mL (ref <0.30)

## 2011-12-26 LAB — BASIC METABOLIC PANEL
BUN: 18 mg/dL (ref 6–23)
Calcium: 9.5 mg/dL (ref 8.4–10.5)
Chloride: 104 mEq/L (ref 96–112)
Creatinine, Ser: 0.77 mg/dL (ref 0.50–1.35)
GFR calc Af Amer: >90 mL/min (ref >90)
GFR calc non Af Amer: 83 mL/min — ABNORMAL LOW (ref >90)

## 2011-12-26 LAB — GLUCOSE, CAPILLARY: Glucose-Capillary: 114 mg/dL — ABNORMAL HIGH (ref 70–99)

## 2011-12-26 MED ORDER — ACETAMINOPHEN 325 MG PO TABS
650.0000 mg | ORAL_TABLET | Freq: Four times a day (QID) | ORAL | Status: DC | PRN
  Administered 2011-12-26: 650 mg via ORAL
  Filled 2011-12-26: qty 2

## 2011-12-26 MED ORDER — ONDANSETRON HCL 4 MG PO TABS: 4.0000 mg | ORAL_TABLET | Freq: Four times a day (QID) | ORAL | Status: DC | PRN

## 2011-12-26 MED ORDER — SODIUM CHLORIDE 0.9 % IV SOLN
INTRAVENOUS | Status: DC
  Administered 2011-12-26: 1000 mL via INTRAVENOUS

## 2011-12-26 MED ORDER — ASPIRIN 81 MG PO CHEW
81.0000 mg | CHEWABLE_TABLET | Freq: Every day | ORAL | Status: DC
  Administered 2011-12-26 – 2011-12-27 (×2): 81 mg via ORAL
  Filled 2011-12-26 (×2): qty 1

## 2011-12-26 MED ORDER — ONDANSETRON HCL 4 MG/2ML IJ SOLN: 4.0000 mg | Freq: Four times a day (QID) | INTRAMUSCULAR | Status: DC | PRN

## 2011-12-26 MED ORDER — SODIUM CHLORIDE 0.9 % IJ SOLN: 3.0000 mL | Freq: Two times a day (BID) | INTRAMUSCULAR | Status: DC

## 2011-12-26 MED ORDER — HEPARIN SODIUM (PORCINE) 5000 UNIT/ML IJ SOLN
5000.0000 [IU] | Freq: Three times a day (TID) | INTRAMUSCULAR | Status: DC
  Administered 2011-12-26 – 2011-12-27 (×3): 5000 [IU] via SUBCUTANEOUS
  Filled 2011-12-26 (×2): qty 1

## 2011-12-26 MED ORDER — GLIPIZIDE 5 MG PO TABS
5.0000 mg | ORAL_TABLET | Freq: Two times a day (BID) | ORAL | Status: DC
Start: 2011-12-26 — End: 2011-12-28
  Administered 2011-12-26 – 2011-12-28 (×4): 5 mg via ORAL
  Filled 2011-12-26 (×4): qty 1

## 2011-12-26 NOTE — H&P (Signed)
[facility] History and Physical  JACERE PANGBORN AVW:[id number] DOB: 1927/09/17 DOA: 12/26/2011  PCP: Rudi Heap, MD   Chief Complaint: Fall,dehydration  HPI:  78 yr old who had a fall in home at around 6pm.Cannot explain why he fell-no chest pain,dizziness,palpitations.No head injury/LOC.No hip pain.  Review of Systems:  Negative except for HPI  Past Medical History  Diagnosis Date  . Diabetes mellitus   . Inner ear inflammation     Social History:  Lives alone,meals on wheels 5/week.Seems independent.   No Known Allergies  Family History  Problem Relation Age of Onset  . Stroke Mother   . Cancer Mother   . Heart failure Mother   . Heart failure Father     Prior to Admission medications   Medication Sig Start Date End Date Taking? Authorizing Provider  aspirin 81 MG chewable tablet Chew 81 mg by mouth daily.   Yes Historical Provider, MD  glipiZIDE (GLUCOTROL) 5 MG tablet Take 1 tablet (5 mg total) by mouth 2 (two) times daily before a meal. 06/19/11 06/18/12 Yes Eriel Dunckel Normajean Glasgow, MD  Multiple Vitamins-Minerals (ONE-A-DAY 50 PLUS) TABS Take 1 tablet by mouth at bedtime.   Yes Historical Provider, MD   Physical Exam: Filed Vitals:   12/26/11 0915 12/26/11 1125 12/26/11 1126 12/26/11 1127  BP: 158/83 146/72 136/84 165/85  Pulse: 80 71 87 91  Temp: 97.4 F (36.3 C)     TempSrc: Oral     Resp: 20     Height: 5\' 6"  (1.676 m)     Weight: 54.885 kg (121 lb)     SpO2: 97%        General:  Clinically dehydrated  Eyes: No jaundice  ENT: WNL  Neck: No lymph nodes  Cardiovascular: WNL  Respiratory: WNL  Abdomen: No tenderness.No masses  Skin: No rash  Musculoskeletal: No clinical evidence of hip fracture  Psychiatric: Appropriate affect  Neurologic: No obvious focal signs.  Labs on Admission:  Basic Metabolic Panel:  Lab 12/26/11 8295  NA 141  K 4.1  CL 104  CO2 23  GLUCOSE 176*  BUN 18  CREATININE 0.77  CALCIUM 9.5  MG --  PHOS --         CBC:  Lab 12/26/11 1000  WBC 10.6*  NEUTROABS 8.5*  HGB 15.9  HCT 44.1  MCV 89.3  PLT 171   Cardiac Enzymes:  Lab 12/26/11 1000  CKTOTAL 430*  CKMB 4.8*  CKMBINDEX --  TROPONINI <0.30      Radiological Exams on Admission: Dg Chest 1 View  12/26/2011  *RADIOLOGY REPORT*  Clinical Data: History of injury from fall 1 day previously.  Pain and weakness.  CHEST - 1 VIEW  Comparison: 06/15/2011 study.  Findings: Cardiac silhouette is upper range of normal size and appears stable.  Ectasia and tortuosity of thoracic aorta are present.  Mediastinal and hilar contours appear normal.  No pleural effusion or pneumothorax is seen.  Lungs are free of infiltrates. There is osteopenic appearance of the bones.  Changes of degenerative disc disease, degenerative spondylosis, and slight scoliosis are seen.  No fracture is evident.  No displaced rib fractures evident.  IMPRESSION: No acute or active abnormality is identified.  No pneumothorax or pleural effusion is seen.  No fracture or displaced rib fracture is evident.  Stable chronic findings are detailed above.  Original Report Authenticated By: Crawford Givens, M.D.   Dg Elbow Complete Left  12/26/2011  *RADIOLOGY REPORT*  Clinical Data: Injury from  fall 1 day previously with pain.  LEFT ELBOW - COMPLETE 3+ VIEW  Comparison: Right elbow examination of same date.  Findings: There is no positive fat pad sign to suggest joint effusion.  Alignment is normal.  No fracture or dislocation is evident.  There is posterior olecranon spurring.  There is slight spurring of the medial and lateral epicondyles.  IMPRESSION: No fracture or dislocation evident.  No evidence of joint effusion. Minimal spurring.  Original Report Authenticated By: Crawford Givens, M.D.   Dg Elbow Complete Right  12/26/2011  *RADIOLOGY REPORT*  Clinical Data: History of injury from fall 1 day previously.  Pain.  RIGHT ELBOW - COMPLETE 3+ VIEW  Comparison: Left elbow examination.  Findings:  There is no positive fat pad sign to suggest joint effusion.  Alignment is normal.  No fracture or dislocation is evident.  There is spurring of the olecranon posteriorly.  There is enthesophyte spurring of the medial and lateral epicondyles.  IMPRESSION: No fracture or dislocation evident.  Degenerative spurring.  Original Report Authenticated By: Crawford Givens, M.D.   Ct Head Wo Contrast  12/26/2011  *RADIOLOGY REPORT*  Clinical Data: Status post fall.  Altered mental status with bilateral arm pain and headache.  CT HEAD WITHOUT CONTRAST  Technique:  Contiguous axial images were obtained from the base of the skull through the vertex without contrast.  Comparison: Head CT 06/15/2011.  Findings: There is no evidence of acute intracranial hemorrhage, mass lesion, brain edema or extra-axial fluid collection.  The ventricles and subarachnoid spaces are appropriately sized for age. There is no CT evidence of acute cortical infarction.  Chronic small vessel ischemic changes are stable.  The visualized paranasal sinuses are clear. The calvarium is intact. Cerumen is again noted in the external auditory canals bilaterally.  IMPRESSION: Stable examination with chronic small vessel ischemic changes.  No acute intracranial or calvarial findings identified.  Original Report Authenticated By: Gerrianne Scale, M.D.    EKG: Independently reviewed. NSR.Lateral ST-T wave  Changes,non-specific  Assessment/Plan   1. Dehydration-ADMIT,iv fluids. 2. Mild rhabdo 3. DM2 4. Deconditioning-PT  Code Status: Full Family Communication: Plan explained to patient Disposition Plan: DC HOME WHEN MEDICALLY STABLE  Time spent: 30 MINS  Wilson Singer [facility] Pager [phone]  If 7PM-7AM, please contact night-coverage [email or web address] Password [email or web address] 12/26/2011, 1:18 PM

## 2011-12-26 NOTE — ED Notes (Signed)
Pt sitting on bedside toilet. Unable to complete ECG at this time.

## 2011-12-26 NOTE — ED Provider Notes (Signed)
History  This chart was scribed for Laray Anger, DO by Bennett Scrape. This patient was seen in room APA02/APA02 and the patient's care was started at 9:23AM.  CSN: [id number]  Arrival date & time 12/26/11  0911   First MD Initiated Contact with Patient 12/26/11 [phone]     Level 5 Caveat-AMS-Pt is not oriented X3.  Chief Complaint  Patient presents with  . Fall  . Generalized Body Aches  . Headache     The history is provided by the patient and the EMS personnel. No language interpreter was used.   Pt seen at 9:30AM. Charles Gibbs is a 78 y.o. male brought in by ambulance who presents to the Emergency Department complaining of sudden onset and resolution of one episode of slip and fall that occurred last night.  Pt states he dropped a "meals on wheels" on the floor, tried to pick it up and fell. States he laid on the floor all night. Endorses he lives alone, so he had to crawl to his lifeline to plug it in the wall.  Pt pressed the call button for help.  States he recalls hitting the left side of his head and his elbows on the floor but is unsure of LOC. EMS found pt covered in urine in feces, unsanitary living conditions.  EMS measured CBG at 180 en route. Pt denies prodromal symptoms before fall, no CP/SOB, no back or neck pain, no abd pain, no N/V/D, no focal motor weakness, no tingling/numbness in extremities.    PCP is Dr. Christell Constant Past Medical History  Diagnosis Date  . Diabetes mellitus   . Inner ear inflammation     History reviewed. No pertinent past surgical history.  Family History  Problem Relation Age of Onset  . Stroke Mother   . Cancer Mother   . Heart failure Mother   . Heart failure Father     History  Substance Use Topics  . Smoking status: Never Smoker   . Smokeless tobacco: Never Used  . Alcohol Use: No      Review of Systems  Unable to perform ROS: Dementia    Allergies  Review of patient's allergies indicates no known allergies.  Home  Medications   Current Outpatient Rx  Name Route Sig Dispense Refill  . ASPIRIN 325 MG PO TABS Oral Take 325 mg by mouth as needed. Takes occasionally as needed for pain    . GLIPIZIDE 5 MG PO TABS Oral Take 1 tablet (5 mg total) by mouth 2 (two) times daily before a meal. 60 tablet 0  . ONE-A-DAY 50 PLUS PO TABS Oral Take 1 tablet by mouth at bedtime.      Triage Vitals: BP 158/83  Pulse 80  Resp 20  Ht 5\' 6"  (1.676 m)  Wt 121 lb (54.885 kg)  BMI 19.53 kg/m2  SpO2 97%  Physical Exam 0935: Physical examination:  Nursing notes reviewed; Vital signs and O2 SAT reviewed;  Constitutional: Well developed, Well nourished, In no acute distress; Head:  Normocephalic, atraumatic; Eyes: EOMI, PERRL, No scleral icterus; ENMT: Mouth and pharynx normal, Mucous membranes dry; Neck: Supple, Full range of motion, No lymphadenopathy; Cardiovascular: Regular rate and rhythm, No gallop; Respiratory: Breath sounds clear & equal bilaterally, No wheezes.  Speaking full sentences with ease, Normal respiratory effort/excursion; Chest: Nontender, Movement normal; Abdomen: Soft, Nontender, Nondistended, Normal bowel sounds; Genitourinary: No CVA tenderness; Spine:  No midline CS, TS, LS tenderness.;; Extremities: Pulses normal, No tenderness, +small superficial hemostatic skin  tears to bilat elbows.  No edema, No calf edema or asymmetry.; Neuro: Awake, alert, confused re: time, events. Major CN grossly intact.  No facial droop. Speech clear. Grips equal, moves all ext well without apparent gross focal motor deficits in extremities.; Skin: Color normal, Warm, Dry.   ED Course  Procedures   MDM  MDM Reviewed: previous chart, nursing note and vitals Reviewed previous: labs Interpretation: ECG, x-ray, labs and CT scan    Date: 12/26/2011  Rate: 70  Rhythm: normal sinus rhythm  QRS Axis: normal  Intervals: normal  ST/T Wave abnormalities: nonspecific ST/T changes, TWI ant-lat leads  Conduction  Disutrbances:none  Narrative Interpretation:   Old EKG Reviewed: none available.  Results for orders placed during the hospital encounter of 12/26/11  CBC WITH DIFFERENTIAL      Component Value Range   WBC 10.6 (*) 4.0 - 10.5 K/uL   RBC 4.94  4.22 - 5.81 MIL/uL   Hemoglobin 15.9  13.0 - 17.0 g/dL   HCT 16.1  09.6 - 04.5 %   MCV 89.3  78.0 - 100.0 fL   MCH 32.2  26.0 - 34.0 pg   MCHC 36.1 (*) 30.0 - 36.0 g/dL   RDW 40.9  81.1 - 91.4 %   Platelets 171  150 - 400 K/uL   Neutrophils Relative 80 (*) 43 - 77 %   Neutro Abs 8.5 (*) 1.7 - 7.7 K/uL   Lymphocytes Relative 12  12 - 46 %   Lymphs Abs 1.2  0.7 - 4.0 K/uL   Monocytes Relative 8  3 - 12 %   Monocytes Absolute 0.8  0.1 - 1.0 K/uL   Eosinophils Relative 0  0 - 5 %   Eosinophils Absolute 0.0  0.0 - 0.7 K/uL   Basophils Relative 0  0 - 1 %   Basophils Absolute 0.0  0.0 - 0.1 K/uL  CARDIAC PANEL(CRET KIN+CKTOT+MB+TROPI)      Component Value Range   Total CK 430 (*) 7 - 232 U/L   CK, MB 4.8 (*) 0.3 - 4.0 ng/mL   Troponin I <0.30  <0.30 ng/mL   Relative Index 1.1  0.0 - 2.5  BASIC METABOLIC PANEL      Component Value Range   Sodium 141  135 - 145 mEq/L   Potassium 4.1  3.5 - 5.1 mEq/L   Chloride 104  96 - 112 mEq/L   CO2 23  19 - 32 mEq/L   Glucose, Bld 176 (*) 70 - 99 mg/dL   BUN 18  6 - 23 mg/dL   Creatinine, Ser 7.82  0.50 - 1.35 mg/dL   Calcium 9.5  8.4 - 95.6 mg/dL   GFR calc non Af Amer 83 (*) >90 mL/min   GFR calc Af Amer >90  >90 mL/min  URINALYSIS, ROUTINE W REFLEX MICROSCOPIC      Component Value Range   Color, Urine YELLOW  YELLOW   APPearance CLEAR  CLEAR   Specific Gravity, Urine 1.020  1.005 - 1.030   pH 7.0  5.0 - 8.0   Glucose, UA 500 (*) NEGATIVE mg/dL   Hgb urine dipstick LARGE (*) NEGATIVE   Bilirubin Urine NEGATIVE  NEGATIVE   Ketones, ur 15 (*) NEGATIVE mg/dL   Protein, ur 30 (*) NEGATIVE mg/dL   Urobilinogen, UA 0.2  0.0 - 1.0 mg/dL   Nitrite NEGATIVE  NEGATIVE   Leukocytes, UA NEGATIVE   NEGATIVE  URINE MICROSCOPIC-ADD ON      Component Value Range  RBC / HPF TOO NUMEROUS TO COUNT  <3 RBC/hpf   Bacteria, UA RARE  RARE   Dg Chest 1 View 12/26/2011  *RADIOLOGY REPORT*  Clinical Data: History of injury from fall 1 day previously.  Pain and weakness.  CHEST - 1 VIEW  Comparison: 06/15/2011 study.  Findings: Cardiac silhouette is upper range of normal size and appears stable.  Ectasia and tortuosity of thoracic aorta are present.  Mediastinal and hilar contours appear normal.  No pleural effusion or pneumothorax is seen.  Lungs are free of infiltrates. There is osteopenic appearance of the bones.  Changes of degenerative disc disease, degenerative spondylosis, and slight scoliosis are seen.  No fracture is evident.  No displaced rib fractures evident.  IMPRESSION: No acute or active abnormality is identified.  No pneumothorax or pleural effusion is seen.  No fracture or displaced rib fracture is evident.  Stable chronic findings are detailed above.  Original Report Authenticated By: Crawford Givens, M.D.   Dg Elbow Complete Left 12/26/2011  *RADIOLOGY REPORT*  Clinical Data: Injury from fall 1 day previously with pain.  LEFT ELBOW - COMPLETE 3+ VIEW  Comparison: Right elbow examination of same date.  Findings: There is no positive fat pad sign to suggest joint effusion.  Alignment is normal.  No fracture or dislocation is evident.  There is posterior olecranon spurring.  There is slight spurring of the medial and lateral epicondyles.  IMPRESSION: No fracture or dislocation evident.  No evidence of joint effusion. Minimal spurring.  Original Report Authenticated By: Crawford Givens, M.D.   Dg Elbow Complete Right 12/26/2011  *RADIOLOGY REPORT*  Clinical Data: History of injury from fall 1 day previously.  Pain.  RIGHT ELBOW - COMPLETE 3+ VIEW  Comparison: Left elbow examination.  Findings: There is no positive fat pad sign to suggest joint effusion.  Alignment is normal.  No fracture or dislocation is  evident.  There is spurring of the olecranon posteriorly.  There is enthesophyte spurring of the medial and lateral epicondyles.  IMPRESSION: No fracture or dislocation evident.  Degenerative spurring.  Original Report Authenticated By: Crawford Givens, M.D.   Ct Head Wo Contrast 12/26/2011  *RADIOLOGY REPORT*  Clinical Data: Status post fall.  Altered mental status with bilateral arm pain and headache.  CT HEAD WITHOUT CONTRAST  Technique:  Contiguous axial images were obtained from the base of the skull through the vertex without contrast.  Comparison: Head CT 06/15/2011.  Findings: There is no evidence of acute intracranial hemorrhage, mass lesion, brain edema or extra-axial fluid collection.  The ventricles and subarachnoid spaces are appropriately sized for age. There is no CT evidence of acute cortical infarction.  Chronic small vessel ischemic changes are stable.  The visualized paranasal sinuses are clear. The calvarium is intact. Cerumen is again noted in the external auditory canals bilaterally.  IMPRESSION: Stable examination with chronic small vessel ischemic changes.  No acute intracranial or calvarial findings identified.  Original Report Authenticated By: Gerrianne Scale, M.D.     1250:  Pt can stand for orthostatic VS, but does so unsteadily.  Unsteadiness increases when trying to take a few steps to walk.  Concerned for pt safety:  pt lives alone, has already fell and laid on the floor overnight.  Found covered in feces and urine.  EMS reported unsanitary living situation, "filthy" and having "roaches all over."  Pt also likely with mild dementia.  May need placement in higher level of care.  Dx testing d/w pt.  Questions  answered.  Verb understanding, agreeable to admit.  T/C to [facility] Dr. Karilyn Cota, case discussed, including:  HPI, pertinent PM/SHx, VS/PE, dx testing, ED course and treatment:  Agreeable to admit, requests he will come to ED for eval.      I personally performed the services  described in this documentation, which was scribed in my presence. The recorded information has been reviewed and considered. Jesus Nevills M   No midlevel was involved in the care of this patient.    Laray Anger, DO 12/27/11 2121

## 2011-12-26 NOTE — ED Notes (Signed)
Patient brought in via EMS. Alert. Airway patent. Patient from home, lives alone. Patient c/o fall after trying to pick something up off floor. Patient reports laying in floor all night. Per EMS patient found on kitchen floor. Blood sugar 180 per EMS. Patient c/o generalized body pain and headache. Patient reports hitting left side of head, denies LOC. Skin tear to outer side of elbows noted bilaterally.

## 2011-12-27 ENCOUNTER — Inpatient Hospital Stay (HOSPITAL_COMMUNITY): Payer: PRIVATE HEALTH INSURANCE

## 2011-12-27 DIAGNOSIS — R627 Adult failure to thrive: Secondary | ICD-10-CM

## 2011-12-27 LAB — CBC
HCT: 36.4 % — ABNORMAL LOW (ref 39.0–52.0)
MCH: 32.2 pg (ref 26.0–34.0)
MCV: 89.4 fL (ref 78.0–100.0)
Platelets: 169 10*3/uL (ref 150–400)
RBC: 4.07 MIL/uL — ABNORMAL LOW (ref 4.22–5.81)
RDW: 13.1 % (ref 11.5–15.5)

## 2011-12-27 LAB — COMPREHENSIVE METABOLIC PANEL
AST: 16 U/L (ref 0–37)
BUN: 16 mg/dL (ref 6–23)
CO2: 23 mEq/L (ref 19–32)
Calcium: 8.4 mg/dL (ref 8.4–10.5)
Chloride: 109 mEq/L (ref 96–112)
Creatinine, Ser: 0.83 mg/dL (ref 0.50–1.35)
GFR calc non Af Amer: 80 mL/min — ABNORMAL LOW (ref >90)
Total Bilirubin: 0.6 mg/dL (ref 0.3–1.2)

## 2011-12-27 LAB — URINE CULTURE

## 2011-12-27 LAB — CARDIAC PANEL(CRET KIN+CKTOT+MB+TROPI)
CK, MB: 2.9 ng/mL (ref 0.3–4.0)
Total CK: 290 U/L — ABNORMAL HIGH (ref 7–232)
Troponin I: <0.3 ng/mL (ref <0.30)

## 2011-12-27 LAB — TSH: TSH: 0.901 u[IU]/mL (ref 0.350–4.500)

## 2011-12-27 LAB — GLUCOSE, CAPILLARY

## 2011-12-27 LAB — HEMOGLOBIN A1C: Mean Plasma Glucose: 137 mg/dL — ABNORMAL HIGH (ref <117)

## 2011-12-27 NOTE — Care Management Note (Signed)
    Page 1 of 1   12/27/2011     3:43:59 PM   CARE MANAGEMENT NOTE 12/27/2011  Patient:  Charles Gibbs, Charles Gibbs   Account Number:  [id number]  Date Initiated:  12/27/2011  Documentation initiated by:  Rosemary Holms  Subjective/Objective Assessment:   Pt admitted from home alone. He had fallen and was able use life line for assistance. Brought to ER and admitted with dehydration.     Action/Plan:   To return home with [facility] PT and SW. There was some question about living conditions. Referred to CSW who spoke to pt. See CSW's note. Pt selected [facility]   Anticipated DC Date:  12/27/2011   Anticipated DC Plan:  HOME W HOME HEALTH SERVICES  In-house referral  Clinical Social Worker      DC Planning Services  CM consult      Choice offered to / List presented to:             Status of service:  Completed, signed off Medicare Important Message given?   (If response is "NO", the following Medicare IM given date fields will be blank) Date Medicare IM given:   Date Additional Medicare IM given:    Discharge Disposition:  HOME W HOME HEALTH SERVICES  Per UR Regulation:    If discussed at Long Length of Stay Meetings, dates discussed:    Comments:  12/27/11 1500 Sharlena Kristensen Leanord Hawking RN BSN CM

## 2011-12-27 NOTE — Progress Notes (Signed)
Paged on call social worker Macario Golds on call SW. Explained situation. Stated she would make a few calls and call me back.

## 2011-12-27 NOTE — Discharge Summary (Addendum)
Physician Discharge Summary  Charles Gibbs YNW:[id number] DOB: 01-03-1928 DOA: 12/26/2011  PCP: Rudi Heap, MD  Admit date: 12/26/2011 Discharge date: 12/27/2011   Discharge Diagnoses:  1.Clinical Dehydration,resolved. 2.Mild Rhabdomyolysis,resolved. 3.DM2,stable   Discharge Condition: Stable,improved  Diet recommendation: regular  Wt Readings from Last 3 Encounters:  12/26/11 58.6 kg (129 lb 3 oz)  06/27/11 59.421 kg (131 lb)  06/15/11 56.1 kg (123 lb 10.9 oz)    History of present illness:  This 78 yr old man presented with clinical dehydration.He had fallen in his home and was not able to get back up.He felt extremely weak.  Hospital Course:  Initial investigations failed to show other causes except for dehydration.No evidence of acute CVA.No clinical evidence of hip fracture.He has done well with iv hydration,has mobilised well with PT. There was concern regarding his home situation and home health PT and SW have been set up.  Procedures:  NONE  Consultations:  NONE  Discharge Exam: Filed Vitals:   12/27/11 1400  BP: 159/70  Pulse: 71  Temp: 98 F (36.7 C)  Resp: 18   Filed Vitals:   12/26/11 1700 12/26/11 2103 12/27/11 0530 12/27/11 1400  BP:  133/69 159/74 159/70  Pulse:  71 71 71  Temp:  97.3 F (36.3 C) 97.6 F (36.4 C) 98 F (36.7 C)  TempSrc:  Oral Oral   Resp:  20 20 18   Height:      Weight: 58.6 kg (129 lb 3 oz)     SpO2:  97% 98% 100%    General: Looks well Cardiovascular: WNL Respiratory: WNL  Discharge Instructions  Discharge Orders    Future Orders Please Complete By Expires   Diet - low sodium heart healthy      Increase activity slowly        Medication List  As of 12/27/2011  5:15 PM   TAKE these medications         aspirin 81 MG chewable tablet   Chew 81 mg by mouth daily.      glipiZIDE 5 MG tablet   Commonly known as: GLUCOTROL   Take 1 tablet (5 mg total) by mouth 2 (two) times daily before a meal.      One-A-Day  50 Plus Tabs   Take 1 tablet by mouth at bedtime.              The results of significant diagnostics from this hospitalization (including imaging, microbiology, ancillary and laboratory) are listed below for reference.    Significant Diagnostic Studies: Dg Chest 1 View  12/26/2011  *RADIOLOGY REPORT*  Clinical Data: History of injury from fall 1 day previously.  Pain and weakness.  CHEST - 1 VIEW  Comparison: 06/15/2011 study.  Findings: Cardiac silhouette is upper range of normal size and appears stable.  Ectasia and tortuosity of thoracic aorta are present.  Mediastinal and hilar contours appear normal.  No pleural effusion or pneumothorax is seen.  Lungs are free of infiltrates. There is osteopenic appearance of the bones.  Changes of degenerative disc disease, degenerative spondylosis, and slight scoliosis are seen.  No fracture is evident.  No displaced rib fractures evident.  IMPRESSION: No acute or active abnormality is identified.  No pneumothorax or pleural effusion is seen.  No fracture or displaced rib fracture is evident.  Stable chronic findings are detailed above.  Original Report Authenticated By: Crawford Givens, M.D.   Dg Pelvis 1-2 Views  12/26/2011  *RADIOLOGY REPORT*  Clinical Data: Status post fall.  Pain.  PELVIS - 1-2 VIEW  Comparison: None.  Findings: The mineralization and alignment are normal.  There is no evidence of acute fracture or dislocation.  There are mild degenerative changes of the sacroiliac joints and hips. Calcifications overlying the symphysis pubis are asymmetric to the left but likely reflect prostatic calcifications and phleboliths. There are femoral arterial vascular calcifications bilaterally.  IMPRESSION: No acute osseous findings.  Original Report Authenticated By: Gerrianne Scale, M.D.   Dg Elbow Complete Left  12/26/2011  *RADIOLOGY REPORT*  Clinical Data: Injury from fall 1 day previously with pain.  LEFT ELBOW - COMPLETE 3+ VIEW  Comparison: Right  elbow examination of same date.  Findings: There is no positive fat pad sign to suggest joint effusion.  Alignment is normal.  No fracture or dislocation is evident.  There is posterior olecranon spurring.  There is slight spurring of the medial and lateral epicondyles.  IMPRESSION: No fracture or dislocation evident.  No evidence of joint effusion. Minimal spurring.  Original Report Authenticated By: Crawford Givens, M.D.   Dg Elbow Complete Right  12/26/2011  *RADIOLOGY REPORT*  Clinical Data: History of injury from fall 1 day previously.  Pain.  RIGHT ELBOW - COMPLETE 3+ VIEW  Comparison: Left elbow examination.  Findings: There is no positive fat pad sign to suggest joint effusion.  Alignment is normal.  No fracture or dislocation is evident.  There is spurring of the olecranon posteriorly.  There is enthesophyte spurring of the medial and lateral epicondyles.  IMPRESSION: No fracture or dislocation evident.  Degenerative spurring.  Original Report Authenticated By: Crawford Givens, M.D.   Ct Head Wo Contrast  12/26/2011  *RADIOLOGY REPORT*  Clinical Data: Status post fall.  Altered mental status with bilateral arm pain and headache.  CT HEAD WITHOUT CONTRAST  Technique:  Contiguous axial images were obtained from the base of the skull through the vertex without contrast.  Comparison: Head CT 06/15/2011.  Findings: There is no evidence of acute intracranial hemorrhage, mass lesion, brain edema or extra-axial fluid collection.  The ventricles and subarachnoid spaces are appropriately sized for age. There is no CT evidence of acute cortical infarction.  Chronic small vessel ischemic changes are stable.  The visualized paranasal sinuses are clear. The calvarium is intact. Cerumen is again noted in the external auditory canals bilaterally.  IMPRESSION: Stable examination with chronic small vessel ischemic changes.  No acute intracranial or calvarial findings identified.  Original Report Authenticated By: Gerrianne Scale, M.D.   Mr Brain Wo Contrast  12/27/2011  *RADIOLOGY REPORT*  Clinical Data: Fall, striking back of head.  Dizziness and weakness.  Question CVA.  MRI HEAD WITHOUT CONTRAST  Technique:  Multiplanar, multiecho pulse sequences of the brain and surrounding structures were obtained according to standard protocol without intravenous contrast.  Comparison: CT head without contrast 12/26/2011.  MRI brain 06/15/2011.  Findings: No acute infarct, hemorrhage, or mass lesion is present. Atrophy and extensive white matter disease is similar to the prior study.  The ventricles are proportionate to the degree of atrophy. Remote lacunar infarcts of the basal ganglia bilaterally are stable.  No significant extra-axial fluid collection is present. Flow is present in the major intracranial arteries.  The globes and orbits are intact.  The paranasal sinuses and mastoid air cells are clear.  IMPRESSION:  1.  No acute intracranial abnormality or significant interval change. 2.  Stable atrophy and extensive white matter disease.  This likely reflects the sequelae of chronic microvascular ischemia.  3.  Remote lacunar infarcts of the basal ganglia bilaterally are stable.  Original Report Authenticated By: Jamesetta Orleans. MATTERN, M.D.    Microbiology: Recent Results (from the past 240 hour(s))  URINE CULTURE     Status: Normal   Collection Time   12/26/11 11:31 AM      Component Value Range Status Comment   Specimen Description URINE, CLEAN CATCH   Final    Special Requests NONE   Final    Culture  Setup Time 12/26/2011 13:00   Final    Colony Count NO GROWTH   Final    Culture NO GROWTH   Final    Report Status 12/27/2011 FINAL   Final      Labs: Basic Metabolic Panel:  Lab 12/27/11 1478 12/26/11 1000  NA 135 141  K 3.7 4.1  CL 109 104  CO2 23 23  GLUCOSE 99 176*  BUN 16 18  CREATININE 0.83 0.77  CALCIUM 8.4 9.5  MG -- --  PHOS -- --   Liver Function Tests:  Lab 12/27/11 0652  AST 16  ALT 10    ALKPHOS 59  BILITOT 0.6  PROT 5.7*  ALBUMIN 3.0*     CBC:  Lab 12/27/11 0652 12/26/11 1000  WBC 6.2 10.6*  NEUTROABS -- 8.5*  HGB 13.1 15.9  HCT 36.4* 44.1  MCV 89.4 89.3  PLT 169 171   Cardiac Enzymes:  Lab 12/27/11 0647 12/26/11 1712 12/26/11 1000  CKTOTAL 290* 393* 430*  CKMB 2.9 4.0 4.8*  CKMBINDEX -- -- --  TROPONINI <0.30 <0.30 <0.30   )  CBG:  Lab 12/27/11 1657 12/27/11 1113 12/27/11 0724 12/26/11 2101 12/26/11 1623  GLUCAP 162* 194* 74 128* 114*    Time coordinating discharge: <30 minutes  Signed:  Elton Heid C  [facility] 12/27/2011, 5:15 PM

## 2011-12-27 NOTE — Progress Notes (Addendum)
Subjective: This man feels much improved since yesterday. He has not ambulated as yet. The history is still very clear as to what happened to this man. He apparently could not get up from the floor all night long for no apparent reason.           Physical Exam: Blood pressure 159/74, pulse 71, temperature 97.6 F (36.4 C), temperature source Oral, resp. rate 20, height 5\' 6"  (1.676 m), weight 58.6 kg (129 lb 3 oz), SpO2 98.00%. He looks systemically well. Heart sounds are present and normal. Lung fields are clear. There are no obvious focal neurological signs. He is alert and orientated.   Investigations:     Basic Metabolic Panel:  Basename 12/27/11 0652 12/26/11 1000  NA 135 141  K 3.7 4.1  CL 109 104  CO2 23 23  GLUCOSE 99 176*  BUN 16 18  CREATININE 0.83 0.77  CALCIUM 8.4 9.5  MG -- --  PHOS -- --   Liver Function Tests:  [facility] 12/27/11 0652  AST 16  ALT 10  ALKPHOS 59  BILITOT 0.6  PROT 5.7*  ALBUMIN 3.0*     CBC:  Basename 12/27/11 0652 12/26/11 1000  WBC 6.2 10.6*  NEUTROABS -- 8.5*  HGB 13.1 15.9  HCT 36.4* 44.1  MCV 89.4 89.3  PLT 169 171    Dg Chest 1 View  12/26/2011  *RADIOLOGY REPORT*  Clinical Data: History of injury from fall 1 day previously.  Pain and weakness.  CHEST - 1 VIEW  Comparison: 06/15/2011 study.  Findings: Cardiac silhouette is upper range of normal size and appears stable.  Ectasia and tortuosity of thoracic aorta are present.  Mediastinal and hilar contours appear normal.  No pleural effusion or pneumothorax is seen.  Lungs are free of infiltrates. There is osteopenic appearance of the bones.  Changes of degenerative disc disease, degenerative spondylosis, and slight scoliosis are seen.  No fracture is evident.  No displaced rib fractures evident.  IMPRESSION: No acute or active abnormality is identified.  No pneumothorax or pleural effusion is seen.  No fracture or displaced rib fracture is evident.  Stable chronic  findings are detailed above.  Original Report Authenticated By: Crawford Givens, M.D.   Dg Pelvis 1-2 Views  12/26/2011  *RADIOLOGY REPORT*  Clinical Data: Status post fall.  Pain.  PELVIS - 1-2 VIEW  Comparison: None.  Findings: The mineralization and alignment are normal.  There is no evidence of acute fracture or dislocation.  There are mild degenerative changes of the sacroiliac joints and hips. Calcifications overlying the symphysis pubis are asymmetric to the left but likely reflect prostatic calcifications and phleboliths. There are femoral arterial vascular calcifications bilaterally.  IMPRESSION: No acute osseous findings.  Original Report Authenticated By: Gerrianne Scale, M.D.   Dg Elbow Complete Left  12/26/2011  *RADIOLOGY REPORT*  Clinical Data: Injury from fall 1 day previously with pain.  LEFT ELBOW - COMPLETE 3+ VIEW  Comparison: Right elbow examination of same date.  Findings: There is no positive fat pad sign to suggest joint effusion.  Alignment is normal.  No fracture or dislocation is evident.  There is posterior olecranon spurring.  There is slight spurring of the medial and lateral epicondyles.  IMPRESSION: No fracture or dislocation evident.  No evidence of joint effusion. Minimal spurring.  Original Report Authenticated By: Crawford Givens, M.D.   Dg Elbow Complete Right  12/26/2011  *RADIOLOGY REPORT*  Clinical Data: History of injury from fall  1 day previously.  Pain.  RIGHT ELBOW - COMPLETE 3+ VIEW  Comparison: Left elbow examination.  Findings: There is no positive fat pad sign to suggest joint effusion.  Alignment is normal.  No fracture or dislocation is evident.  There is spurring of the olecranon posteriorly.  There is enthesophyte spurring of the medial and lateral epicondyles.  IMPRESSION: No fracture or dislocation evident.  Degenerative spurring.  Original Report Authenticated By: Crawford Givens, M.D.   Ct Head Wo Contrast  12/26/2011  *RADIOLOGY REPORT*  Clinical Data: Status post  fall.  Altered mental status with bilateral arm pain and headache.  CT HEAD WITHOUT CONTRAST  Technique:  Contiguous axial images were obtained from the base of the skull through the vertex without contrast.  Comparison: Head CT 06/15/2011.  Findings: There is no evidence of acute intracranial hemorrhage, mass lesion, brain edema or extra-axial fluid collection.  The ventricles and subarachnoid spaces are appropriately sized for age. There is no CT evidence of acute cortical infarction.  Chronic small vessel ischemic changes are stable.  The visualized paranasal sinuses are clear. The calvarium is intact. Cerumen is again noted in the external auditory canals bilaterally.  IMPRESSION: Stable examination with chronic small vessel ischemic changes.  No acute intracranial or calvarial findings identified.  Original Report Authenticated By: Gerrianne Scale, M.D.      Medications: I have reviewed the patient's current medications.  Impression: 1. Dehydration, clinically improved. 2. Type 2 diabetes mellitus, stable. 3. Mild rhabdomyolysis, improved.     Plan: 1. Discontinue IV fluids. 2. Physical and occupational therapy evaluation to assess whether he needs any home health. 3. MRI brain without contrast to make sure this man has actually not had a small stroke.     LOS: 1 day   Wilson Singer Pager [phone]  12/27/2011, 8:34 AM

## 2011-12-27 NOTE — Clinical Social Work Psychosocial (Signed)
Clinical Social Work Department BRIEF PSYCHOSOCIAL ASSESSMENT 12/27/2011  Patient:  Charles Gibbs, Charles Gibbs     Account Number:  [id number]     Admit date:  12/26/2011  Clinical Social Worker:  Nancie Neas  Date/Time:  12/27/2011 11:00 AM  Referred by:  RN  Date Referred:  12/27/2011 Referred for  Other - See comment   Other Referral:   home situation   Interview type:  Patient Other interview type:    PSYCHOSOCIAL DATA Living Status:  ALONE Admitted from facility:   Level of care:   Primary support name:  Pauletta Primary support relationship to patient:  CHILD, ADULT Degree of support available:   limited    CURRENT CONCERNS Current Concerns  Other - See comment   Other Concerns:   home situation    SOCIAL WORK ASSESSMENT / PLAN CSW met with pt at bedside following referral from RN in progression about home situation. CSW reviewed chart and notes indicate that pt was found laying in feces and roaches were in home. Pt alert and oriented. He reports he lives alone and his daughter checks on him about once a week at home. Pt states he manages well on his own and receives meals on wheels. He has a friend who provides transportation for pt to doctor appointments. Pt said he fell sometime Tuesday evening and laid in the floor all night. His [employer] did not work at first, but pt was able to crawl and charge it enough to call for help. Per RN, pt has been continent. Pt said he tried to spray for the roaches but it has not helped. CSW suggested pt call a pest control company to assist and he said he would try that. Pt worked with PT today and did well. Recommendation is for home health PT follow up. Pt states he will return home at d/c and is open to home health but does not feel he has any other needs. Pt has capacity per MD. CSW suggested home health social worker to CM as well.   Assessment/plan status:  Referral to [facility] Other assessment/ plan:   Information/referral to  community resources:   CM for home health  pest control    PATIENT'S/FAMILY'S RESPONSE TO PLAN OF CARE: Pt is oriented and is requesting to return home at d/c. He admits to a pest problem and plans to call pest control company when he returns home. CSW attempted to reach daughter but recording did not give option for staff directory at her place of employment. IV fluids discontinued today. CSW will sign off unless further needs arise prior to d/c.        Derenda Fennel, LCSW [phone]

## 2011-12-27 NOTE — Evaluation (Signed)
Occupational Therapy Evaluation Patient Details Name: Charles Gibbs MRN: [id number] DOB: 15-Nov-1927 Today's Date: 12/27/2011 Time: 1340-1400 OT Time Calculation (min): 20 min  OT Assessment / Plan / Recommendation Clinical Impression  Patient is a 78 y/o male s/p Dehydration presenting to acute OT with all education completed. Patient's functional performance is at baseline for BADL. Patient does not have good hygiene habits at home as in shown by his dirty and long finger and toenails. Patient states that he has nail clippers at home but forgets to use them. I don't believe patient has the ability or finger strength to clip nails. patient states that he has difficulty buttoning his shirts at times. I recommened Home Health OT to assess living environment, safety within the home, and fine motor strength during UB dressing.    OT Assessment  All further OT needs can be met in the next venue of care    Follow Up Recommendations  Home health OT       Equipment Recommendations  Defer to next venue          Precautions / Restrictions Precautions Precautions: Fall   Pertinent Vitals/Pain No reports of pain.    ADL  Lower Body Bathing: Simulated;Supervision/safety Where Assessed - Lower Body Bathing: Unsupported sit to stand Upper Body Dressing: Simulated;Supervision/safety Where Assessed - Upper Body Dressing: Unsupported sitting Lower Body Dressing: Performed;Supervision/safety Where Assessed - Lower Body Dressing: Supported sit to stand      Visit Information  Last OT Received On: 12/27/11 Assistance Needed: +1    Subjective Data  Subjective: "I had a fall when I was putting my meals on wheels meals away." Patient Stated Goal: To go home.   Prior Functioning  Vision/Perception  Home Living Lives With: Alone Available Help at Discharge: Family;Available PRN/intermittently;Other (Comment) (Caregiver comes on thurs. to grocery shop?) Type of Home: House Home Access:  Stairs to enter [employer] of Steps: 2...each separated by a small porch Entrance Stairs-Rails: None Home Layout: One level Bathroom Shower/Tub: Engineer, manufacturing systems: Standard Home Adaptive Equipment: Walker - standard;Straight cane Additional Comments: Didn't use cane (brother's). Patient sponge bathes at sink because he is afraid he won't be able to get out of the tub. Prior Function Level of Independence: Needs assistance Needs Assistance: Light Housekeeping;Meal Prep Meal Prep: Total Light Housekeeping: Total Able to Take Stairs?: Yes Driving: No Vocation: Retired Musician: No difficulties Dominant Hand: Right      Cognition  Overall Cognitive Status: Impaired Area of Impairment: Safety/judgement;Memory Orientation Level: Appears intact for tasks assessed Behavior During Session: [facility] for tasks performed Memory Deficits: Patient states that he has nail clippers at home but he doesn't remember to use them even though every time he looks at the length of his nails it should remind him to clip his nails. Safety/Judgement - Other Comments: Poor hygiene habits at home. Patient does not attend to keeping his fingernails and toenails clean and at a safe length.  Cognition - Other Comments: patient cognition slightly impaired regarding safety and judgement in relation to hygiene and grooming. Based on length of fingernails and toenails and the dirt underneith fingernails, patient does not present with good hygiene.     Extremity/Trunk Assessment Right Upper Extremity Assessment RUE ROM/Strength/Tone: Within functional levels RUE Coordination: Deficits RUE Coordination Deficits: patient states that he has difficulty buttoning his shirts sometimes at home. Left Upper Extremity Assessment LUE ROM/Strength/Tone: Within functional levels LUE Coordination: Deficits LUE Coordination Deficits: patient states that he has difficulty buttoning  his shirts  sometimes at home.   Mobility Transfers Transfers: Sit to Stand;Stand to Sit Sit to Stand: 6: Modified independent (Device/Increase time);With upper extremity assist;From bed Stand to Sit: 6: Modified independent (Device/Increase time);To bed;With upper extremity assist         End of Session OT - End of Session Activity Tolerance: Patient tolerated treatment well Patient left: in bed;with call bell/phone within reach    [facility], OTR/L 12/27/2011, 2:05 PM

## 2011-12-27 NOTE — Progress Notes (Signed)
SW on call called me back. States unable to find any new number or contact any family or neighbors. Will have to hold until am so pt can get transport to home.

## 2011-12-27 NOTE — Progress Notes (Signed)
Pt with orders to be discharge. Discharge instructions given, verbalized understanding. Discharge on hold.

## 2011-12-27 NOTE — Progress Notes (Signed)
Obtained verbal order from Doctor Orvan Falconer to hold discharge until AM. SW will assess situation in the am.

## 2011-12-27 NOTE — Evaluation (Signed)
Physical Therapy Evaluation Patient Details Name: Charles Gibbs MRN: [id number] DOB: 04-02-1928 Today's Date: 12/27/2011 Time: 4403-4742 PT Time Calculation (min): 35 min  PT Assessment / Plan / Recommendation Clinical Impression  Pt has no c/o, no pain.  He has a mild high level balance dysfunction which does increase his risk of fall.  He normally ambulates with no assistive device at home,  but does admit to "furniture walking".  He was instructed in the use of a cane and has one at home.  He would benefit from short term HHPT for continued instruction in the use of the cane and in developing improved dynamic standing balance.    PT Assessment  All further PT needs can be met in the next venue of care    Follow Up Recommendations  Home health PT    Barriers to Discharge None      Equipment Recommendations  None recommended by PT    Recommendations for Other Services     Frequency      Precautions / Restrictions Precautions Precautions: Fall Restrictions Weight Bearing Restrictions: No   Pertinent Vitals/Pain       Mobility  Bed Mobility Bed Mobility: Supine to Sit;Sit to Supine Supine to Sit: 7: Independent;HOB flat Sit to Supine: 7: Independent;HOB flat Transfers Transfers: Sit to Stand;Stand to Sit Sit to Stand: 6: Modified independent (Device/Increase time);With upper extremity assist;From bed Stand to Sit: 6: Modified independent (Device/Increase time);With upper extremity assist;To chair/3-in-1 Ambulation/Gait Ambulation/Gait Assistance: 5: Supervision Ambulation Distance (Feet): 250 Feet Assistive device: None;Straight cane Ambulation/Gait Assistance Details: pt "furniture walks" at home...has never used a cane before, so was instructed in its' use due to jhigh level balance deficit Gait Pattern: Step-through pattern;Right foot flat;Left foot flat Stairs: Yes Stairs Assistance: 5: Supervision Stair Management Technique: One rail Right;Alternating  pattern;Forwards Number of Stairs: 2  Wheelchair Mobility Wheelchair Mobility: No    Exercises     PT Diagnosis: Abnormality of gait  PT Problem List: Decreased balance PT Treatment Interventions:     PT Goals    Visit Information  Last PT Received On: 12/27/11    Subjective Data  Subjective: I just fell over when I bent down to pick up my [employer] tray Patient Stated Goal: none stated   Prior Functioning  Home Living Lives With: Alone Available Help at Discharge: Personal care attendant;Family Type of Home: House Home Access: Stairs to enter [employer] of Steps: 2...each separated by a small porch Entrance Stairs-Rails: None Home Layout: One level Home Adaptive Equipment: Walker - standard;Straight cane Prior Function Level of Independence: Needs assistance Needs Assistance: Light Housekeeping Able to Take Stairs?: Yes Driving: No Vocation: Retired Musician: No difficulties Dominant Hand: Right    Cognition  Overall Cognitive Status: Appears within functional limits for tasks assessed/performed Arousal/Alertness: Awake/alert Orientation Level: Appears intact for tasks assessed Behavior During Session: [facility] for tasks performed    Extremity/Trunk Assessment Right Lower Extremity Assessment RLE ROM/Strength/Tone: WFL for tasks assessed RLE Sensation: WFL - Light Touch;WFL - Proprioception RLE Coordination: WFL - gross motor Left Lower Extremity Assessment LLE ROM/Strength/Tone: WFL for tasks assessed LLE Sensation: WFL - Light Touch;WFL - Proprioception LLE Coordination: WFL - gross motor Trunk Assessment Trunk Assessment: Normal   Balance Balance Balance Assessed: Yes Dynamic Sitting Balance Dynamic Sitting - Balance Support: No upper extremity supported;Feet supported Dynamic Sitting - Level of Assistance: 7: Independent Static Standing Balance Static Standing - Balance Support: No upper extremity supported Static  Standing -  Level of Assistance: 5: Stand by assistance Dynamic Standing Balance Dynamic Standing - Balance Support: No upper extremity supported Dynamic Standing - Level of Assistance: 5: Stand by assistance High Level Balance High Level Balance Activites: Backward walking;Direction changes;Turns;Sudden stops;Head turns High Level Balance Comments: has occasional LOB during turns  End of Session PT - End of Session Equipment Utilized During Treatment: Gait belt Activity Tolerance: Patient tolerated treatment well Patient left: in chair;with call bell/phone within reach;with chair alarm set  GP     [name] 12/27/2011, 9:49 AM

## 2011-12-27 NOTE — Progress Notes (Signed)
UR chart review completed.  

## 2011-12-27 NOTE — Progress Notes (Signed)
12/27/11 1933 Patient had discharge orders, nursing unable to reach patient's daughter for transport home. Multiple attempts made to reach and number had no answer, pt home number disconnected. Patient states lives alone, case management left number for [name] for transport home, but pt discharge orders placed after they had closed. Notified nursing supervisor of situation, stated could set up cab ride home. Cab ride set up but patient stated did not have house key, stated "EMS said they would put it under my air condition, but i'm not sure". Notified nursing supervisor that  nursing staff felt unsafe discharge situation. Dr Orvan Falconer paged and notified of situation, need order to hold discharge until tomorrow when social work/case management can address. Stated was a social work issue and not a Recruitment consultant, Education administrator to page Psychologist, prison and probation services. Discussed with nursing supervisor, night shift nurse paging oncall social worker as well. Riccardo Dubin

## 2011-12-28 DIAGNOSIS — E860 Dehydration: Secondary | ICD-10-CM

## 2011-12-28 NOTE — Progress Notes (Signed)
Patient taken out of facility by staff. No questions. Friend present to take patient home. Sgt. Purvis notified of patients departure home. sgt stated that deputy was sent out to patients home was to arrive at 1pm for assistance.

## 2011-12-28 NOTE — Clinical Social Work Psychosocial (Unsigned)
     Clinical Social Work Department BRIEF PSYCHOSOCIAL ASSESSMENT 12/28/2011  Patient:  Charles Gibbs, Charles Gibbs     Account Number:  [id number]     Admit date:  12/26/2011  Clinical Social Worker:  Santa Genera, CLINICAL SOCIAL WORKER  Date/Time:  12/28/2011 11:00 AM  Referred by:  RN  Date Referred:  12/28/2011 Referred for  Transportation assistance   Other Referral:   Interview type:  Patient Other interview type:   Daughter via phone, neighbors and friends    PSYCHOSOCIAL DATA Living Status:  ALONE Admitted from facility:   Level of care:   Primary support name:  [name] Primary support relationship to patient:  FRIEND Degree of support available:   Limited.  Daughter could not be reached by phone during hospital stay.  [name] is Agricultural consultant w [employer] who shops weekly w patient.    CURRENT CONCERNS Current Concerns  Other - See comment   Other Concerns:   Refused [name] transport home last night after discharge, stating he did not know where his key was and was afraid he could not get into house.    SOCIAL WORK ASSESSMENT / PLAN CSW met w patient at bedside to address issue w patient refusing [name] transport home because he was afraid he could not get into his house.  CSW asked patient to list neighbors who could check at house to see if key was there, reached [name] and [name] [phone]) who went to house and said key was not where EMS said they had put it. Patient also gave name of next door neighbor, [name] [phone]).  Patient also gave name of [name], volunteer w [employer] who help patient w shopping weekly. Called [name], who agreed to pick up patient at [facility] approx 12:30.  CSW then called [name] who agreed to have a deputy at the resident approx 1:00 PM to assist w getting into house.  Also called [name] and asked if EMS worker could recall where they had put key.  While CSW was in room, daughter [name] called  patient phone and spoke w patient.  Daughter agreed to meet father at his residence approx 12:45 and assist w getting him settled at home.    [name] expressed some concern about patient's ability to care for self at home.  [name] he gets frozen Meals on Wheels because [place] for [place] delivery.  Concerned that patient is not eating regularly and has difficulty getting in/out of car. Says he thinks patient is able to care for self, but needs more help w food.    CSW spoke w [name] from [name] and verified tha tSW has been ordered to assess home situation and recommend resouces if needed.  Patient has neighbors, but daughter is only family and lives in [place].    Spoke w [name] and asked that they assist patient w getting back in his house.  [name] [phone]) says he will be there and can make suggestions to patient about how to enter his house and will file police report to cover situation.  Police cannot break into house, but can help assist w situation.   Assessment/plan status:  No Further Intervention Required Other assessment/ plan:   Information/referral to community resources:   Caregivers, Home Health SW    PATIENTS/FAMILYS RESPONSE TO PLAN OF CARE:

## 2011-12-28 NOTE — Progress Notes (Signed)
Spoke with patient about daughter's phone number and work place. Called a nursing home in [facility] and found out daughters name is Nida? Darcella Gasman. Patient agreed that Paulette was a nick-name. Called home number given to me by staff x2 with no answer or answering machine to leave message. SW notified and looking further into situation. Will continue to monitor.

## 2011-12-28 NOTE — Progress Notes (Signed)
This man was due to be discharged yesterday as he was medically stable. Unfortunately there were issues with transportation. He still medically stable for discharge and can be discharged today.

## 2012-02-24 ENCOUNTER — Emergency Department (HOSPITAL_COMMUNITY)
Admission: EM | Admit: 2012-02-24 | Discharge: 2012-02-24 | Disposition: A | Discharge status: Home / Self Care | Payer: PRIVATE HEALTH INSURANCE | Attending: Emergency Medicine | Admitting: Emergency Medicine

## 2012-02-24 ENCOUNTER — Encounter (HOSPITAL_COMMUNITY): Payer: Self-pay

## 2012-02-24 DIAGNOSIS — E119 Type 2 diabetes mellitus without complications: Secondary | ICD-10-CM | POA: Insufficient documentation

## 2012-02-24 DIAGNOSIS — R42 Dizziness and giddiness: Secondary | ICD-10-CM

## 2012-02-24 HISTORY — DX: Dizziness and giddiness: R42

## 2012-02-24 LAB — CBC WITH DIFFERENTIAL/PLATELET
Basophils Absolute: 0 10*3/uL (ref 0.0–0.1)
Eosinophils Relative: 1 % (ref 0–5)
HCT: 41.4 % (ref 39.0–52.0)
Lymphocytes Relative: 26 % (ref 12–46)
MCV: 88.1 fL (ref 78.0–100.0)
Monocytes Absolute: 0.6 10*3/uL (ref 0.1–1.0)
Monocytes Relative: 10 % (ref 3–12)
RDW: 12.2 % (ref 11.5–15.5)
WBC: 6.3 10*3/uL (ref 4.0–10.5)

## 2012-02-24 LAB — COMPREHENSIVE METABOLIC PANEL
AST: 14 U/L (ref 0–37)
BUN: 16 mg/dL (ref 6–23)
CO2: 25 mEq/L (ref 19–32)
Calcium: 9 mg/dL (ref 8.4–10.5)
Creatinine, Ser: 0.8 mg/dL (ref 0.50–1.35)
GFR calc Af Amer: >90 mL/min (ref >90)
GFR calc non Af Amer: 82 mL/min — ABNORMAL LOW (ref >90)
Glucose, Bld: 214 mg/dL — ABNORMAL HIGH (ref 70–99)

## 2012-02-24 MED ORDER — MECLIZINE HCL 25 MG PO TABS: ORAL_TABLET | ORAL | Status: DC

## 2012-02-24 MED ORDER — SODIUM CHLORIDE 0.9 % IV BOLUS (SEPSIS)
500.0000 mL | Freq: Once | INTRAVENOUS | Status: AC
  Administered 2012-02-24: 500 mL via INTRAVENOUS

## 2012-02-24 NOTE — ED Notes (Signed)
Spoke with pt's daughter and she will come pick him up.

## 2012-02-24 NOTE — ED Notes (Signed)
Pt arrived by ems from home, has been having vertigo for years, reports having same today. No different symptoms from usual.

## 2012-02-24 NOTE — ED Provider Notes (Signed)
History   This chart was scribed for Charles Lennert, MD by Charles Gibbs . The patient was seen in room APA04/APA04. Patient's care was started at 0935.    CSN: [id number]  Arrival date & time 02/24/12  4098   First MD Initiated Contact with Patient 02/24/12 0935      Chief Complaint  Patient presents with  . Dizziness    (Consider location/radiation/quality/duration/timing/severity/associated sxs/prior treatment) HPI Comments: Charles Gibbs is a 78 y.o. male who presents to the Emergency Department via EMS complaining of intermittent, moderate dizziness for the past few days. He states that he has a h/o vertigo over the past few years. He describes the current episode as a spinning sensation. He denies any associated nausea. He denies any current dizziness. He states that he is not currently taking any medication for his dizziness. He denies any difficulty with ambulation and reports that he currently ambulates without assistance. He has a h/o DM, inner ear inflammation and vertigo. He states that he lives at home alone.    PCP: Charles Gibbs in [place]  Patient is a 78 y.o. male presenting with weakness. The history is provided by the patient. No language interpreter was used.  Weakness The primary symptoms include dizziness. Primary symptoms do not include headaches, seizures or nausea.  He describes the dizziness as a sensation of spinning. The dizziness began 2 to 5 days ago. The dizziness has been gradually improving since its onset. It is a recurrent problem. The dizziness is associated with rotation and a specific position. Dizziness also occurs with weakness. Dizziness does not occur with nausea.  Additional symptoms include weakness. Additional symptoms do not include hallucinations.    Past Medical History  Diagnosis Date  . Diabetes mellitus   . Inner ear inflammation   . Vertigo     History reviewed. No pertinent past surgical history.  Family History    Problem Relation Age of Onset  . Stroke Mother   . Cancer Mother   . Heart failure Mother   . Heart failure Father     History  Substance Use Topics  . Smoking status: Never Smoker   . Smokeless tobacco: Never Used  . Alcohol Use: No      Review of Systems  Constitutional: Negative for fatigue.  HENT: Negative for congestion, sinus pressure and ear discharge.   Eyes: Negative for discharge.  Respiratory: Negative for cough.   Cardiovascular: Negative for chest pain.  Gastrointestinal: Negative for nausea, abdominal pain and diarrhea.  Genitourinary: Negative for frequency and hematuria.  Musculoskeletal: Negative for back pain.  Skin: Negative for rash.  Neurological: Positive for dizziness and weakness. Negative for seizures and headaches.  Hematological: Negative.   Psychiatric/Behavioral: Negative for hallucinations.  All other systems reviewed and are negative.    Allergies  Review of patient's allergies indicates no known allergies.  Home Medications   Current Outpatient Rx  Name Route Sig Dispense Refill  . ASPIRIN 81 MG PO CHEW Oral Chew 81 mg by mouth daily.    Marland Kitchen GLIPIZIDE 5 MG PO TABS Oral Take 1 tablet (5 mg total) by mouth 2 (two) times daily before a meal. 60 tablet 0  . ONE-A-DAY 50 PLUS PO TABS Oral Take 1 tablet by mouth at bedtime.      There were no vitals taken for this visit.  Physical Exam  Constitutional: He is oriented to person, place, and time. He appears well-developed.  HENT:  Head: Normocephalic and atraumatic.  Eyes: Conjunctivae normal and EOM are normal. Pupils are equal, round, and reactive to light. No scleral icterus.  Neck: Neck supple. No thyromegaly present.  Cardiovascular: Normal rate, regular rhythm and normal heart sounds.  Exam reveals no gallop and no friction rub.   No murmur heard. Pulmonary/Chest: Effort normal and breath sounds normal. No stridor. He has no wheezes. He has no rales. He exhibits no tenderness.   Abdominal: He exhibits no distension. There is no tenderness. There is no rebound.  Musculoskeletal: Normal range of motion. He exhibits no edema.  Lymphadenopathy:    He has no cervical adenopathy.  Neurological: He is oriented to person, place, and time. Coordination normal.       Generalized weakness in lower extremities.   Skin: No rash noted. No erythema.  Psychiatric: He has a normal mood and affect. His behavior is normal.    ED Course  Procedures (including critical care time)  DIAGNOSTIC STUDIES: Oxygen Saturation is 100% on room air, normal by my interpretation.    COORDINATION OF CARE:  09:55-Pt ambulates in ED without difficulty. Discussed planned course of treatment with the patient including blood work and IV fluids, who is agreeable at this time.   10:00-Medication Orders: Sodium chloride 0.9% bolus 500 mL-once.   11:33-Recheck: Informed pt of lab results. Will d/c home with Antivert. Discussed f/u with Charles Gibbs in a few days. Pt is agreeable with plan.   Results for orders placed during the hospital encounter of 02/24/12  CBC WITH DIFFERENTIAL      Component Value Range   WBC 6.3  4.0 - 10.5 K/uL   RBC 4.70  4.22 - 5.81 MIL/uL   Hemoglobin 14.9  13.0 - 17.0 g/dL   HCT 78.4  69.6 - 29.5 %   MCV 88.1  78.0 - 100.0 fL   MCH 31.7  26.0 - 34.0 pg   MCHC 36.0  30.0 - 36.0 g/dL   RDW 28.4  13.2 - 44.0 %   Platelets 174  150 - 400 K/uL   Neutrophils Relative 62  43 - 77 %   Neutro Abs 4.0  1.7 - 7.7 K/uL   Lymphocytes Relative 26  12 - 46 %   Lymphs Abs 1.7  0.7 - 4.0 K/uL   Monocytes Relative 10  3 - 12 %   Monocytes Absolute 0.6  0.1 - 1.0 K/uL   Eosinophils Relative 1  0 - 5 %   Eosinophils Absolute 0.1  0.0 - 0.7 K/uL   Basophils Relative 0  0 - 1 %   Basophils Absolute 0.0  0.0 - 0.1 K/uL  COMPREHENSIVE METABOLIC PANEL      Component Value Range   Sodium 134 (*) 135 - 145 mEq/L   Potassium 3.7  3.5 - 5.1 mEq/L   Chloride 99  96 - 112 mEq/L   CO2 25   19 - 32 mEq/L   Glucose, Bld 214 (*) 70 - 99 mg/dL   BUN 16  6 - 23 mg/dL   Creatinine, Ser 1.02  0.50 - 1.35 mg/dL   Calcium 9.0  8.4 - 72.5 mg/dL   Total Protein 6.6  6.0 - 8.3 g/dL   Albumin 3.4 (*) 3.5 - 5.2 g/dL   AST 14  0 - 37 U/L   ALT 9  0 - 53 U/L   Alkaline Phosphatase 73  39 - 117 U/L   Total Bilirubin 0.8  0.3 - 1.2 mg/dL   GFR calc non Af Denyse Dago  82 (*) >90 mL/min   GFR calc Af Amer >90  >90 mL/min   No results found.   No diagnosis found.    MDM      The chart was scribed for me under my direct supervision.  I personally performed the history, physical, and medical decision making and all procedures in the evaluation of this patient.Charles Lennert, MD 02/24/12 1136

## 2012-03-20 ENCOUNTER — Encounter (HOSPITAL_COMMUNITY): Payer: Self-pay | Admitting: *Deleted

## 2012-03-20 ENCOUNTER — Emergency Department (HOSPITAL_COMMUNITY): Payer: PRIVATE HEALTH INSURANCE

## 2012-03-20 ENCOUNTER — Inpatient Hospital Stay (HOSPITAL_COMMUNITY)
Admission: EM | Admit: 2012-03-20 | Discharge: 2012-03-22 | DRG: 641 | Disposition: A | Discharge status: Home Health | Payer: PRIVATE HEALTH INSURANCE | Attending: Internal Medicine | Admitting: Internal Medicine

## 2012-03-20 DIAGNOSIS — Z7982 Long term (current) use of aspirin: Secondary | ICD-10-CM

## 2012-03-20 DIAGNOSIS — R471 Dysarthria and anarthria: Secondary | ICD-10-CM

## 2012-03-20 DIAGNOSIS — R6251 Failure to thrive (child): Secondary | ICD-10-CM | POA: Diagnosis present

## 2012-03-20 DIAGNOSIS — E860 Dehydration: Principal | ICD-10-CM | POA: Diagnosis present

## 2012-03-20 DIAGNOSIS — R739 Hyperglycemia, unspecified: Secondary | ICD-10-CM

## 2012-03-20 DIAGNOSIS — R627 Adult failure to thrive: Secondary | ICD-10-CM | POA: Diagnosis present

## 2012-03-20 DIAGNOSIS — Z9181 History of falling: Secondary | ICD-10-CM

## 2012-03-20 DIAGNOSIS — K5900 Constipation, unspecified: Secondary | ICD-10-CM | POA: Diagnosis not present

## 2012-03-20 DIAGNOSIS — IMO0001 Reserved for inherently not codable concepts without codable children: Secondary | ICD-10-CM | POA: Diagnosis present

## 2012-03-20 DIAGNOSIS — F0390 Unspecified dementia without behavioral disturbance: Secondary | ICD-10-CM | POA: Diagnosis present

## 2012-03-20 DIAGNOSIS — Z79899 Other long term (current) drug therapy: Secondary | ICD-10-CM

## 2012-03-20 DIAGNOSIS — W19XXXA Unspecified fall, initial encounter: Secondary | ICD-10-CM

## 2012-03-20 DIAGNOSIS — R7309 Other abnormal glucose: Secondary | ICD-10-CM

## 2012-03-20 DIAGNOSIS — E1165 Type 2 diabetes mellitus with hyperglycemia: Secondary | ICD-10-CM

## 2012-03-20 DIAGNOSIS — E119 Type 2 diabetes mellitus without complications: Secondary | ICD-10-CM

## 2012-03-20 DIAGNOSIS — R531 Weakness: Secondary | ICD-10-CM

## 2012-03-20 LAB — COMPREHENSIVE METABOLIC PANEL
ALT: 9 U/L (ref 0–53)
Albumin: 3.3 g/dL — ABNORMAL LOW (ref 3.5–5.2)
Alkaline Phosphatase: 69 U/L (ref 39–117)
BUN: 30 mg/dL — ABNORMAL HIGH (ref 6–23)
Chloride: 103 mEq/L (ref 96–112)
GFR calc Af Amer: 77 mL/min — ABNORMAL LOW (ref >90)
Glucose, Bld: 383 mg/dL — ABNORMAL HIGH (ref 70–99)
Potassium: 4.3 mEq/L (ref 3.5–5.1)
Sodium: 137 mEq/L (ref 135–145)
Total Bilirubin: 0.4 mg/dL (ref 0.3–1.2)

## 2012-03-20 LAB — CBC WITH DIFFERENTIAL/PLATELET
Hemoglobin: 13.7 g/dL (ref 13.0–17.0)
Lymphs Abs: 1.5 10*3/uL (ref 0.7–4.0)
Monocytes Relative: 9 % (ref 3–12)
Neutro Abs: 5.8 10*3/uL (ref 1.7–7.7)
Neutrophils Relative %: 71 % (ref 43–77)
Platelets: 166 10*3/uL (ref 150–400)
RBC: 4.33 MIL/uL (ref 4.22–5.81)
WBC: 8.1 10*3/uL (ref 4.0–10.5)

## 2012-03-20 LAB — TROPONIN I: Troponin I: <0.3 ng/mL (ref <0.30)

## 2012-03-20 MED ORDER — BISACODYL 10 MG RE SUPP: 10.0000 mg | Freq: Every day | RECTAL | Status: DC | PRN

## 2012-03-20 MED ORDER — FLEET ENEMA 7-19 GM/118ML RE ENEM: 1.0000 | ENEMA | Freq: Once | RECTAL | Status: AC | PRN

## 2012-03-20 MED ORDER — INSULIN ASPART 100 UNIT/ML ~~LOC~~ SOLN
0.0000 [IU] | Freq: Every day | SUBCUTANEOUS | Status: DC
  Administered 2012-03-21: 4 [IU] via SUBCUTANEOUS

## 2012-03-20 MED ORDER — INSULIN ASPART 100 UNIT/ML ~~LOC~~ SOLN
0.0000 [IU] | Freq: Three times a day (TID) | SUBCUTANEOUS | Status: DC
  Administered 2012-03-21: 5 [IU] via SUBCUTANEOUS
  Administered 2012-03-21: 3 [IU] via SUBCUTANEOUS
  Administered 2012-03-22 (×2): 5 [IU] via SUBCUTANEOUS

## 2012-03-20 MED ORDER — METFORMIN HCL 500 MG PO TABS
1000.0000 mg | ORAL_TABLET | Freq: Two times a day (BID) | ORAL | Status: DC
  Administered 2012-03-21 – 2012-03-22 (×2): 1000 mg via ORAL
  Filled 2012-03-20 (×2): qty 2

## 2012-03-20 MED ORDER — SODIUM CHLORIDE 0.9 % IJ SOLN
3.0000 mL | Freq: Two times a day (BID) | INTRAMUSCULAR | Status: DC
  Administered 2012-03-20 – 2012-03-22 (×2): 3 mL via INTRAVENOUS

## 2012-03-20 MED ORDER — ASPIRIN 81 MG PO CHEW
81.0000 mg | CHEWABLE_TABLET | Freq: Every day | ORAL | Status: DC
  Administered 2012-03-21 – 2012-03-22 (×2): 81 mg via ORAL
  Filled 2012-03-20: qty 1

## 2012-03-20 MED ORDER — GLIPIZIDE 5 MG PO TABS
5.0000 mg | ORAL_TABLET | Freq: Two times a day (BID) | ORAL | Status: DC
  Administered 2012-03-21 – 2012-03-22 (×2): 5 mg via ORAL
  Filled 2012-03-20 (×3): qty 1

## 2012-03-20 MED ORDER — GLIPIZIDE-METFORMIN HCL 2.5-500 MG PO TABS: 2.0000 | ORAL_TABLET | Freq: Two times a day (BID) | ORAL | Status: DC

## 2012-03-20 MED ORDER — POTASSIUM CHLORIDE IN NACL 20-0.9 MEQ/L-% IV SOLN
INTRAVENOUS | Status: DC
  Administered 2012-03-20 – 2012-03-22 (×4): via INTRAVENOUS

## 2012-03-20 MED ORDER — ACETAMINOPHEN 325 MG PO TABS: 650.0000 mg | ORAL_TABLET | Freq: Four times a day (QID) | ORAL | Status: DC | PRN

## 2012-03-20 MED ORDER — POLYETHYLENE GLYCOL 3350 17 G PO PACK: 17.0000 g | PACK | Freq: Every day | ORAL | Status: DC | PRN

## 2012-03-20 MED ORDER — ONDANSETRON HCL 4 MG/2ML IJ SOLN
4.0000 mg | Freq: Four times a day (QID) | INTRAMUSCULAR | Status: DC | PRN
  Administered 2012-03-22: 4 mg via INTRAVENOUS
  Filled 2012-03-20: qty 2

## 2012-03-20 MED ORDER — ACETAMINOPHEN 650 MG RE SUPP: 650.0000 mg | Freq: Four times a day (QID) | RECTAL | Status: DC | PRN

## 2012-03-20 MED ORDER — ENOXAPARIN SODIUM 40 MG/0.4ML ~~LOC~~ SOLN
40.0000 mg | SUBCUTANEOUS | Status: DC
  Administered 2012-03-21 – 2012-03-22 (×2): 40 mg via SUBCUTANEOUS
  Filled 2012-03-20 (×2): qty 0.4

## 2012-03-20 MED ORDER — TRAZODONE HCL 50 MG PO TABS
25.0000 mg | ORAL_TABLET | Freq: Every evening | ORAL | Status: DC | PRN
  Administered 2012-03-21: 25 mg via ORAL
  Filled 2012-03-20: qty 1

## 2012-03-20 MED ORDER — ONDANSETRON HCL 4 MG PO TABS: 4.0000 mg | ORAL_TABLET | Freq: Four times a day (QID) | ORAL | Status: DC | PRN

## 2012-03-20 MED ORDER — SODIUM CHLORIDE 0.9 % IV BOLUS (SEPSIS)
1000.0000 mL | Freq: Once | INTRAVENOUS | Status: AC
  Administered 2012-03-20: 1000 mL via INTRAVENOUS

## 2012-03-20 NOTE — H&P (Signed)
[facility] History and Physical  JSEAN TAUSSIG  UJW:[id number]  DOB: 1928/04/29   DOA: 03/20/2012   PCP:   Rudi Heap, MD   Chief Complaint:  Weakness  HPI: Charles Gibbs is an 78 y.o. male.   Elderly Caucasian gentleman with type 2 diabetes, who lives alone, has meals delivered by [employer], but complains that he doesn't like them, and otherwise cares for himself, summoned EMS via life-alert this evening, after she fell and couldn't get up.  In the emergency room patient was evaluated, noted to be dehydrated, with markedly elevated blood sugar, complain of episodic dizziness, and the hospitalist service was called to assist with management.  At the interview patient denies any close relatives, he gives the approximate date as Friday, gives the year as 12-13, describes last year as 12-12, then corrects himself and says of this year must be 13-13; knows that the next holiday is Thanksgiving.  Denies fever cough or cold chest pains or shortness of breath palpitations or vertigo; but says he has in the past been told she has an inner ear problem which causes dizziness.  Rewiew of Systems:   All systems negative except as marked bold or noted in the HPI;  Constitutional: Negative for malaise, fever and chills. ;  Eyes: Negative for eye pain, redness and discharge. ;  ENMT: Negative for ear pain, hoarseness, nasal congestion, sinus pressure and sore throat. ;  Cardiovascular: Negative for chest pain, palpitations, diaphoresis, dyspnea and peripheral edema. ;  Respiratory: Negative for cough, hemoptysis, wheezing and stridor. ;  Gastrointestinal: Negative for nausea, vomiting, diarrhea, constipation, abdominal pain, melena, blood in stool, hematemesis, jaundice and rectal bleeding. unusual weight loss..   Genitourinary: Negative for frequency, dysuria, incontinence,flank pain and hematuria; Musculoskeletal: Negative for back pain and neck pain. Negative for swelling and  trauma.;  Skin: . Negative for pruritus, rash, abrasions, bruising and skin lesion.; ulcerations Neuro: Negative for headache, lightheadedness and neck stiffness. Negative for weakness, altered level of consciousness , altered mental status, extremity weakness, burning feet, involuntary movement, seizure and syncope.  Psych: negative for anxiety, depression, insomnia, tearfulness, panic attacks, hallucinations, paranoia, suicidal or homicidal ideation    Past Medical History  Diagnosis Date  . Diabetes mellitus   . Inner ear inflammation   . Vertigo     History reviewed. No pertinent past surgical history.  Medications:  HOME MEDS: Prior to Admission medications   Medication Sig Start Date End Date Taking? Authorizing Provider  aspirin 81 MG chewable tablet Chew 81 mg by mouth daily.   Yes Historical Provider, MD  glipiZIDE-metformin (METAGLIP) 2.5-500 MG per tablet Take 2 tablets by mouth 2 (two) times daily.   Yes Historical Provider, MD  meclizine (ANTIVERT) 25 MG tablet Take one every 6 hours for dizziness 02/24/12  Yes [name], MD  Multiple Vitamins-Minerals (ONE-A-DAY 50 PLUS) TABS Take 1 tablet by mouth at bedtime.   Yes Historical Provider, MD     Allergies:  No Known Allergies  Social History:   reports that he has never smoked. He has never used smokeless tobacco. He reports that he does not drink alcohol or use illicit drugs.  Family History: Family History  Problem Relation Age of Onset  . Stroke Mother   . Cancer Mother   . Heart failure Mother   . Heart failure Father      Physical Exam: Filed Vitals:   03/20/12 1906 03/20/12 1910 03/20/12 1913 03/20/12 2155  BP: 151/76 159/77  157/81 151/91  Pulse: 70 74 83 73  Temp:      TempSrc:      Resp:    20  Height:      Weight:      SpO2:    98%   Blood pressure 151/91, pulse 73, temperature 97.3 F (36.3 C), temperature source Oral, resp. rate 20, height 5\' 6"  (1.676 m), weight 59.421 kg (131 lb),  SpO2 98.00%.  GEN:  Pleasant pale-looking slightly confused elderly Caucasian gentleman lying in the stretcher in no acute distress; cooperative with exam PSYCH:  alert and oriented  does not appear anxious or depressed; affect is appropriate. HEENT: Mucous membranes pale and dry and anicteric; PERRLA; EOM intact; no cervical lymphadenopathy nor thyromegaly or carotid bruit; no JVD; Breasts:: Not examined CHEST WALL: No tenderness CHEST: Normal respiration, clear to auscultation bilaterally HEART: Regular rate and rhythm; no murmurs rubs or gallops BACK: Mild kyphosis mild scoliosis; no CVA tenderness ABDOMEN:  soft non-tender; no masses, no organomegaly, normal abdominal bowel sounds; no intertriginous candida. Rectal Exam: Not done EXTREMITIES: age-appropriate arthropathy of the hands and knees; no edema; no ulcerations. Genitalia: not examined PULSES: 2+ and symmetric SKIN: Normal hydration no rash or ulceration CNS: Cranial nerves 2-12 grossly intact no focal lateralizing neurologic deficit   Labs on Admission:  Basic Metabolic Panel:  Lab 03/20/12 7829  NA 137  K 4.3  CL 103  CO2 25  GLUCOSE 383*  BUN 30*  CREATININE 1.03  CALCIUM 8.7  MG --  PHOS --   Liver Function Tests:  Lab 03/20/12 2002  AST 13  ALT 9  ALKPHOS 69  BILITOT 0.4  PROT 6.5  ALBUMIN 3.3*   No results found for this basename: LIPASE:5,AMYLASE:5 in the last 168 hours No results found for this basename: AMMONIA:5 in the last 168 hours CBC:  Lab 03/20/12 2002  WBC 8.1  NEUTROABS 5.8  HGB 13.7  HCT 38.4*  MCV 88.7  PLT 166   Cardiac Enzymes:  Lab 03/20/12 2002  CKTOTAL --  CKMB --  CKMBINDEX --  TROPONINI <0.30   BNP: No components found with this basename: POCBNP:5 D-dimer: No components found with this basename: D-DIMER:5 CBG: No results found for this basename: GLUCAP:5 in the last 168 hours  Radiological Exams on Admission: Dg Chest 2 View  03/20/2012  *RADIOLOGY REPORT*   Clinical Data: Weakness.  Diabetic.  Nonsmoker.  CHEST - 2 VIEW  Comparison: 12/26/2011 and 12/20/2009.  Findings: No infiltrate, congestive heart failure or pneumothorax. Heart size within normal limits.  Tortuous aorta.  IMPRESSION: No acute abnormality.   Original Report Authenticated By: Lacy Duverney, M.D.     EKG: Independently reviewed. Normal sinus rhythm left axis deviation; laterally ischemia   Assessment/Plan Present on Admission:  .Hyperglycemia .Diabetes mellitus .Failure to thrive in childhood .Dehydration .Dementia   PLAN: We'll bring this gentleman in on observation for hydration, and temperature of his hyperglycemia; after hydration we'll reevaluate his mental status; he may not be sufficiently competent to live alone; will get a social work evaluation.  Other plans as per orders.  Code Status: FULL CODE  Family Communication: No family located Disposition Plan: Reevaluate in the morning to consider placement    Voris Tigert Nocturnist [facility] Pager [phone]   03/20/2012, 10:27 PM

## 2012-03-20 NOTE — ED Provider Notes (Signed)
History   This chart was scribed for [name], MD, by Frederik Pear. The patient was seen in room APA12/APA12 and the patient's care was started at 1948.    CSN: [id number]  Arrival date & time 03/20/12  1924   First MD Initiated Contact with Patient 03/20/12 1948      Chief Complaint  Patient presents with  . Dizziness    (Consider location/radiation/quality/duration/timing/severity/associated sxs/prior treatment) HPI Comments: Charles Gibbs is a 78 y.o. male who presents to the Emergency Department complaining of mild, gradually improving dizziness that began after he dropped something and bent over to pick it up and was unable to get up. He denies any LOC. In ED, he reports that the dizziness has since resolved.   Patient is a 78 y.o. male presenting with fall. The history is provided by the patient.  Fall The accident occurred less than 1 hour ago. The fall occurred while standing. He fell from a height of 3 to 5 ft. He landed on carpet. There was no blood loss. Pain location: No pain. The pain is at a severity of 0/10. The patient is experiencing no pain. He was ambulatory at the scene. There was no drug use involved in the accident. There was no alcohol use involved in the accident. Pertinent negatives include no abdominal pain, no hematuria and no headaches. Prehospitalization: nothing.    Past Medical History  Diagnosis Date  . Diabetes mellitus   . Inner ear inflammation   . Vertigo     History reviewed. No pertinent past surgical history.  Family History  Problem Relation Age of Onset  . Stroke Mother   . Cancer Mother   . Heart failure Mother   . Heart failure Father     History  Substance Use Topics  . Smoking status: Never Smoker   . Smokeless tobacco: Never Used  . Alcohol Use: No      Review of Systems  Constitutional: Negative for fatigue.  HENT: Negative for congestion, sinus pressure and ear discharge.   Eyes: Negative for discharge.    Respiratory: Negative for cough.   Cardiovascular: Negative for chest pain.  Gastrointestinal: Negative for abdominal pain and diarrhea.  Genitourinary: Negative for frequency and hematuria.  Musculoskeletal: Negative for back pain.  Skin: Negative for rash.  Neurological: Positive for dizziness. Negative for seizures and headaches.  Hematological: Negative.   Psychiatric/Behavioral: Negative for hallucinations.    Allergies  Review of patient's allergies indicates no known allergies.  Home Medications   Current Outpatient Rx  Name Route Sig Dispense Refill  . ASPIRIN 81 MG PO CHEW Oral Chew 81 mg by mouth daily.    Marland Kitchen GLIPIZIDE-METFORMIN HCL 2.5-500 MG PO TABS Oral Take 2 tablets by mouth 2 (two) times daily.    Marland Kitchen MECLIZINE HCL 25 MG PO TABS  Take one every 6 hours for dizziness 20 tablet 0  . ONE-A-DAY 50 PLUS PO TABS Oral Take 1 tablet by mouth at bedtime.      BP 157/81  Pulse 83  Temp 97.3 F (36.3 C) (Oral)  Resp 6  Ht 5\' 6"  (1.676 m)  Wt 131 lb (59.421 kg)  BMI 21.14 kg/m2  SpO2 100%  Physical Exam  Constitutional: He is oriented to person, place, and time. He appears well-developed.       Generalized weakness throughout.  HENT:  Head: Normocephalic and atraumatic.  Eyes: Conjunctivae normal and EOM are normal. No scleral icterus.  Neck: Neck supple. No thyromegaly  present.  Cardiovascular: Normal rate and regular rhythm.  Exam reveals no gallop and no friction rub.   No murmur heard. Pulmonary/Chest: No stridor. He has no wheezes. He has no rales. He exhibits no tenderness.  Abdominal: He exhibits no distension. There is no tenderness. There is no rebound.  Musculoskeletal: Normal range of motion. He exhibits no edema.  Lymphadenopathy:    He has no cervical adenopathy.  Neurological: He is oriented to person, place, and time. Coordination normal.  Skin: No rash noted. No erythema.  Psychiatric: He has a normal mood and affect. His behavior is normal.     ED Course  Procedures (including critical care time)  DIAGNOSTIC STUDIES: Oxygen Saturation is 100% on room air, normal by my interpretation.    COORDINATION OF CARE:  19:50- Discussed planned course of treatment with the patient, who is agreeable at this time.    Labs Reviewed - No data to display No results found.   No diagnosis found.    MDM   The chart was scribed for me under my direct supervision.  I personally performed the history, physical, and medical decision making and all procedures in the evaluation of this patient.[name], MD 03/20/12 2103

## 2012-03-20 NOTE — ED Notes (Signed)
Patient bent down to pick something up at home and had to get down on hands and knees.  Could not get back up from floor d/t dizziness.

## 2012-03-20 NOTE — ED Notes (Signed)
Report called to Linwood Dibbles, RN on unit 300

## 2012-03-21 ENCOUNTER — Encounter (HOSPITAL_COMMUNITY): Payer: Self-pay | Admitting: *Deleted

## 2012-03-21 DIAGNOSIS — R5381 Other malaise: Secondary | ICD-10-CM

## 2012-03-21 LAB — CBC
Hemoglobin: 13.1 g/dL (ref 13.0–17.0)
MCH: 31.6 pg (ref 26.0–34.0)
MCHC: 35.4 g/dL (ref 30.0–36.0)
MCV: 89.2 fL (ref 78.0–100.0)
Platelets: 176 10*3/uL (ref 150–400)

## 2012-03-21 LAB — GLUCOSE, CAPILLARY
Glucose-Capillary: 163 mg/dL — ABNORMAL HIGH (ref 70–99)
Glucose-Capillary: 212 mg/dL — ABNORMAL HIGH (ref 70–99)
Glucose-Capillary: 79 mg/dL (ref 70–99)
Glucose-Capillary: 195 mg/dL — ABNORMAL HIGH (ref 70–99)

## 2012-03-21 LAB — URINE MICROSCOPIC-ADD ON

## 2012-03-21 LAB — BASIC METABOLIC PANEL
BUN: 23 mg/dL (ref 6–23)
CO2: 23 mEq/L (ref 19–32)
Calcium: 8.2 mg/dL — ABNORMAL LOW (ref 8.4–10.5)
GFR calc non Af Amer: 79 mL/min — ABNORMAL LOW (ref >90)
Glucose, Bld: 171 mg/dL — ABNORMAL HIGH (ref 70–99)

## 2012-03-21 LAB — URINALYSIS, ROUTINE W REFLEX MICROSCOPIC
Bilirubin Urine: NEGATIVE
Glucose, UA: >1000 mg/dL — AB
Urobilinogen, UA: 0.2 mg/dL (ref 0.0–1.0)

## 2012-03-21 LAB — TROPONIN I: Troponin I: <0.3 ng/mL (ref <0.30)

## 2012-03-21 LAB — HEMOGLOBIN A1C: Hgb A1c MFr Bld: 8 % — ABNORMAL HIGH (ref <5.7)

## 2012-03-21 MED ORDER — PIOGLITAZONE HCL 30 MG PO TABS
15.0000 mg | ORAL_TABLET | Freq: Every day | ORAL | Status: DC
  Administered 2012-03-21 – 2012-03-22 (×2): 15 mg via ORAL
  Filled 2012-03-21 (×2): qty 1

## 2012-03-21 NOTE — Care Management Note (Signed)
    Page 1 of 2   03/21/2012     11:56:38 AM   CARE MANAGEMENT NOTE 03/21/2012  Patient:  Charles Gibbs, Charles Gibbs   Account Number:  [id number]  Date Initiated:  03/21/2012  Documentation initiated by:  Sharrie Rothman  Subjective/Objective Assessment:   Pt admitted from home with dehydration. Pt lives alone and has a life alert button. Pt has a daughter who sees him about once a week. Pt has a cane and receives meals on wheels. Also has a volunteer caregiver who transports him to appts.     Action/Plan:   Pt needs HH PT and SW. Pt chose [facility]. Alroy Bailiff of [facility] is aware and will collect the pts information from the chart. No DME needs noted. pt is refusing placement. Weekend staff will fax orders to [facility] once written if pt d/cd over the week.   Anticipated DC Date:  03/22/2012   Anticipated DC Plan:  HOME W HOME HEALTH SERVICES  In-house referral  Clinical Social Worker      DC Planning Services  CM consult      [facility] Choice  HOME HEALTH   Choice offered to / List presented to:  C-1 Patient        HH arranged  HH-2 PT  HH-6 SOCIAL WORKER      HH agency  [facility]   Status of service:  Completed, signed off Medicare Important Message given?  NA - LOS <3 / Initial given by admissions (If response is "NO", the following Medicare IM given date fields will be blank) Date Medicare IM given:   Date Additional Medicare IM given:    Discharge Disposition:  HOME W HOME HEALTH SERVICES  Per UR Regulation:    If discussed at Long Length of Stay Meetings, dates discussed:    Comments:  03/21/12 1155 Arlyss Queen, RN BSN CM

## 2012-03-21 NOTE — Clinical Social Work Note (Signed)
Possible d/c home tomorrow. CSW arranged transport through [name] using [employer] in case pt's daughter cannot be reached or is unable to transport pt home. RN can call Aurther Loft at [phone] for pick up.  Derenda Fennel, [place] [phone]

## 2012-03-21 NOTE — Evaluation (Signed)
Occupational Therapy Evaluation Patient Details Name: Charles Gibbs MRN: [id number] DOB: 22-Mar-1928 Today's Date: 03/21/2012 Time: 1508-1600 OT Time Calculation (min): 52 min  OT Assessment / Plan / Recommendation Clinical Impression  Patient is a 78 y/o male s/p Dehydration presenting to acute OT with all education complete. Patient would benefit from living at an ALF. Discussed present living situation and pros and cons of an ALF. Patient seemed to be open to visiting various ALFs. Pt does have decreased safety awareness and would not be safe living alone due to dementia. If patient does not discharge to ALF I recommend Home Health OT to assess safety at home. Acute OT services are not needed at this time; will sign off.    OT Assessment  All further OT needs can be met in the next venue of care    Follow Up Recommendations  Home health OT;Supervision - Intermittent       Equipment Recommendations  3 in 1 bedside comode          Precautions / Restrictions Precautions Precautions: Fall   Pertinent Vitals/Pain No report of pain.    ADL  Lower Body Dressing: Performed;Modified independent Where Assessed - Lower Body Dressing: Unsupported sit to stand Toilet Transfer: Performed;Supervision/safety Toilet Transfer Method: Surveyor, minerals: Materials engineer and Hygiene: Performed;Supervision/safety Where Assessed - Engineer, mining and Hygiene: Sit to stand from 3-in-1 or toilet;Standing     OT Problem List: Decreased strength;Decreased safety awareness     Visit Information  Last OT Received On: 03/21/12 Assistance Needed: +1    Subjective Data  Subjective: "I have a pair of toenail clippers at home." Patient Stated Goal: To go home.   Prior Functioning     Home Living Lives With: Alone Available Help at Discharge: Other (Comment) (daughter comes to check in on pt once a week) Type of Home:  House Home Access: Level entry Home Layout: One level Bathroom Shower/Tub: Health visitor: Standard Home Adaptive Equipment: Straight cane;Grab bars in shower;Built-in shower seat;Hand-held shower hose Additional Comments: Pt states that he doesn't use cane. Prior Function Level of Independence: Independent with assistive device(s) Driving: No Vocation: Retired Musician: No difficulties Dominant Hand: Right            Cognition  Overall Cognitive Status: Impaired Area of Impairment: Attention;Safety/judgement Arousal/Alertness: Awake/alert Orientation Level: Appears intact for tasks assessed Behavior During Session: [facility] for tasks performed Current Attention Level: Selective Safety/Judgement: Decreased awareness of need for assistance Safety/Judgement - Other Comments: Pt has very long finger and toenails. recommended patient visit foot doctor to cut nails. pt states that he tries to cut toenails with pocket knife. Cognition - Other Comments: Decreased attention to questions. Will start to answer questions then trail off into a story unrelated to conversation.    Extremity/Trunk Assessment Right Upper Extremity Assessment RUE ROM/Strength/Tone: Within functional levels (MMT: 4/5) RUE Sensation: WFL - Light Touch;WFL - Proprioception RUE Coordination: WFL - gross/fine motor Left Upper Extremity Assessment LUE ROM/Strength/Tone: Within functional levels (MMT" 4/5) LUE Sensation: WFL - Light Touch;WFL - Proprioception LUE Coordination: WFL - gross/fine motor     Mobility Transfers Sit to Stand: 6: Modified independent (Device/Increase time);Without upper extremity assist;From chair/3-in-1;With armrests Stand to Sit: 6: Modified independent (Device/Increase time);To chair/3-in-1;With armrests;With upper extremity assist        Exercise Other Exercises Other Exercises: Educated on using stress ball to increase grip strength in left hand.    Other Exercises:  Educated on chair push-ups to increase tricep strength and sit to stands.      End of Session OT - End of Session Activity Tolerance: Patient tolerated treatment well Patient left: in bed;with call bell/phone within reach;with bed alarm set    Limmie Patricia, OTR/L 03/21/2012, 3:57 PM

## 2012-03-21 NOTE — Progress Notes (Signed)
UR Chart Review Completed  

## 2012-03-21 NOTE — Clinical Social Work Psychosocial (Signed)
Clinical Social Work Department BRIEF PSYCHOSOCIAL ASSESSMENT 03/21/2012  Patient:  Charles Gibbs, Charles Gibbs     Account Number:  [id number]     Admit date:  03/20/2012  Clinical Social Worker:  Nancie Neas  Date/Time:  03/21/2012 10:25 AM  Referred by:  Physician  Date Referred:  03/21/2012 Referred for  Psychosocial assessment   Other Referral:   Interview type:  Patient Other interview type:    PSYCHOSOCIAL DATA Living Status:  ALONE Admitted from facility:   Level of care:   Primary support name:  Paulette Primary support relationship to patient:  CHILD, ADULT Degree of support available:   ? limited    CURRENT CONCERNS Current Concerns  Other - See comment   Other Concerns:   home situation    SOCIAL WORK ASSESSMENT / PLAN CSW met with pt at bedside following referral from EDP. Pt alert and oriented during assessment. Known to CSW from previous admission in August. Pt states he lives alone and generally manages well. He fell at home last night and used his Life Alert to notify EMS. CSW attempted to notify pt's daughter but no voicemail option and pt is unsure where she works but said she probably would not be at home now. Pt reports he sees his daughter about weekly as she lives near [place]. He receives Meals on Wheels and fixes microwaveable meals. Pt has a volunteer caregiver who takes him to get groceries and to MD appointments. PT evaluated pt and feel ALF would be most appropriate. CSW discussed ALF with pt and his response was "I'd rather be dead than go to a facility."   Assessment/plan status:  Psychosocial Support/Ongoing Assessment of Needs Other assessment/ plan:   Information/referral to community resources:   ALF list  CM for home health    PATIENT'S/FAMILY'S RESPONSE TO PLAN OF CARE: Pt refusing to consider ALF as he thinks he does fine at home. Facility list left in pt room. CSW updated MD on situation and CM is aware pt will need home health PT and SW. CSW  signing off.        Derenda Fennel, [place] [phone]

## 2012-03-21 NOTE — Progress Notes (Signed)
Chart reviewed.  D/w s.w.  Subjective: Feels better, but still pretty dizzy and weak with standing. Denies polyuria, polydipsia. Does not check blood glucoses. Refuses assisted living.  Objective: Vital signs in last 24 hours: Filed Vitals:   03/21/12 0549 03/21/12 0842 03/21/12 0845 03/21/12 0847  BP: 132/65 129/66 143/67 130/68  Pulse: 65 70 78 81  Temp: 97.5 F (36.4 C) 97.3 F (36.3 C) 97.3 F (36.3 C) 97.3 F (36.3 C)  TempSrc: Oral Oral Oral Oral  Resp: 20 20 20 20   Height:      Weight: 59 kg (130 lb 1.1 oz)     SpO2: 98% 96% 99% 97%   Weight change:  No intake or output data in the 24 hours ending 03/21/12 1249  General: Up in chair, eating lunch without difficulty. Alert, oriented and talkative. Appropriate. HEENT: Slightly dry mucous membranes. Lungs clear to auscultation bilaterally without wheeze rhonchi or rales Cardiovascular regular rate rhythm without murmurs gas rubs Abdomen soft nontender nondistended Extremities no clubbing cyanosis or edema Skin: Dry with poor turgor.  Lab Results: Basic Metabolic Panel:  Lab 03/21/12 1610 03/20/12 2002  NA 140 137  K 3.6 4.3  CL 108 103  CO2 23 25  GLUCOSE 171* 383*  BUN 23 30*  CREATININE 0.87 1.03  CALCIUM 8.2* 8.7  MG -- --  PHOS -- --   Liver Function Tests:  Lab 03/20/12 2002  AST 13  ALT 9  ALKPHOS 69  BILITOT 0.4  PROT 6.5  ALBUMIN 3.3*   No results found for this basename: LIPASE:2,AMYLASE:2 in the last 168 hours No results found for this basename: AMMONIA:2 in the last 168 hours CBC:  Lab 03/21/12 0506 03/20/12 2002  WBC 8.0 8.1  NEUTROABS -- 5.8  HGB 13.1 13.7  HCT 37.0* 38.4*  MCV 89.2 88.7  PLT 176 166   Cardiac Enzymes:  Lab 03/21/12 1034 03/21/12 0506 03/20/12 2002  CKTOTAL -- -- --  CKMB -- -- --  CKMBINDEX -- -- --  TROPONINI <0.30 <0.30 <0.30   BNP: No results found for this basename: PROBNP:3 in the last 168 hours D-Dimer: No results found for this basename:  DDIMER:2 in the last 168 hours CBG:  Lab 03/21/12 1153 03/21/12 0727 03/20/12 2313 03/20/12 1918  GLUCAP 212* 163* 314* 281*   Hemoglobin A1C: No results found for this basename: HGBA1C in the last 168 hours Fasting Lipid Panel: No results found for this basename: CHOL,HDL,LDLCALC,TRIG,CHOLHDL,LDLDIRECT in the last 960 hours Thyroid Function Tests: No results found for this basename: TSH,T4TOTAL,FREET4,T3FREE,THYROIDAB in the last 168 hours Coagulation: No results found for this basename: LABPROT:4,INR:4 in the last 168 hours Anemia Panel: No results found for this basename: VITAMINB12,FOLATE,FERRITIN,TIBC,IRON,RETICCTPCT in the last 168 hours Urine Drug Screen:   Alcohol Level: No results found for this basename: ETH:2 in the last 168 hours Urinalysis:  Lab 03/21/12 0237  COLORURINE YELLOW  LABSPEC 1.010  PHURINE 7.0  GLUCOSEU >1000*  HGBUR LARGE*  BILIRUBINUR NEGATIVE  KETONESUR 15*  PROTEINUR TRACE*  UROBILINOGEN 0.2  NITRITE NEGATIVE  LEUKOCYTESUR NEGATIVE   Micro Results: No results found for this or any previous visit (from the past 240 hour(s)). Studies/Results: Dg Chest 2 View  03/20/2012  *RADIOLOGY REPORT*  Clinical Data: Weakness.  Diabetic.  Nonsmoker.  CHEST - 2 VIEW  Comparison: 12/26/2011 and 12/20/2009.  Findings: No infiltrate, congestive heart failure or pneumothorax. Heart size within normal limits.  Tortuous aorta.  IMPRESSION: No acute abnormality.   Original Report Authenticated By:  Lacy Duverney, M.D.    Scheduled Meds:   . aspirin  81 mg Oral Daily  . enoxaparin (LOVENOX) injection  40 mg Subcutaneous Q24H  . glipiZIDE  5 mg Oral BID AC   And  . metFORMIN  1,000 mg Oral BID AC  . insulin aspart  0-15 Units Subcutaneous TID WC  . insulin aspart  0-5 Units Subcutaneous QHS  . pioglitazone  15 mg Oral Daily  . sodium chloride  1,000 mL Intravenous Once  . sodium chloride  3 mL Intravenous Q12H  . DISCONTD: glipiZIDE-metformin  2 tablet Oral  BID   Continuous Infusions:   . 0.9 % NaCl with KCl 20 mEq / L 125 mL/hr at 03/21/12 1228   PRN Meds:.acetaminophen, bisacodyl, ondansetron (ZOFRAN) IV, ondansetron, polyethylene glycol, sodium phosphate, traZODone, DISCONTD: acetaminophen Assessment/Plan: Principal Problem:  *Dehydration Active Problems:  DM (diabetes mellitus), type 2, uncontrolled  Will hydrate for another day. Add Actos. Home insulin is not a good choice, as he does not check his blood glucoses. Hemoglobin A1c and TSH are pending. Will add B12. Patient refuses placement and at this time is competent. We'll order home health social work and physical therapy and RN.   LOS: 1 day   Heba Ige L 03/21/2012, 12:49 PM

## 2012-03-21 NOTE — Evaluation (Signed)
Physical Therapy Evaluation Patient Details Name: Charles Gibbs MRN: [id number] DOB: 1927/06/16 Today's Date: 03/21/2012 Time: 4098-1191 PT Time Calculation (min): 36 min  PT Assessment / Plan / Recommendation Clinical Impression  Pt was seen for eval and found to be very close to prior functional status.  He lives alone, occasionally ambulating with a cane.  He admits to being very sedentary at home.  He would be much better off in ACLF due to LE weakness and hx of dementia.  I discussed this with hime...he did not flatly reject the idea, but said that he would prefer to go home.  Would recommend HHPT for home safety check if he does return home.    PT Assessment  All further PT needs can be met in the next venue of care    Follow Up Recommendations  Home health PT    Does the patient have the potential to tolerate intense rehabilitation      Barriers to Discharge        Equipment Recommendations  None recommended by PT    Recommendations for Other Services     Frequency      Precautions / Restrictions Precautions Precautions: Fall Restrictions Weight Bearing Restrictions: No   Pertinent Vitals/Pain       Mobility  Bed Mobility Bed Mobility: Rolling Left;Sit to Sidelying Left Rolling Left: 6: Modified independent (Device/Increase time) Sit to Sidelying Left: 3: Mod assist;HOB flat Details for Bed Mobility Assistance: pt states that he sellps in the bottom half of a bunk bed at home and pulls on the upper bed in order to sit up...he did not have the UE strength to push up out of the hospital bed without mod assist Transfers Transfers: Sit to Stand;Stand to Sit Sit to Stand: 6: Modified independent (Device/Increase time);With upper extremity assist;From bed Stand to Sit: 6: Modified independent (Device/Increase time);With upper extremity assist;To chair/3-in-1;To bed Ambulation/Gait Ambulation/Gait Assistance: 5: Supervision Ambulation Distance (Feet): 300  Feet Assistive device: None Gait Pattern: Left flexed knee in stance;Shuffle;Trunk flexed Gait velocity: WNL General Gait Details: gait is stable with no assistive device, but if gait were significantly challenged, we would probably see a LOB Stairs: No    Shoulder Instructions     Exercises     PT Diagnosis: Generalized weakness  PT Problem List: Decreased strength PT Treatment Interventions:     PT Goals    Visit Information  Last PT Received On: 03/21/12    Subjective Data  Subjective: no c/o Patient Stated Goal: wants to go home   Prior Functioning  Home Living Lives With: Alone Type of Home: House Home Access: Level entry Home Layout: One level Home Adaptive Equipment: Straight cane Additional Comments: occasionally ambulates with a cane Prior Function Level of Independence: Independent Driving: No Vocation: Retired Musician: No difficulties    Cognition  Overall Cognitive Status: Appears within functional limits for tasks assessed/performed Arousal/Alertness: Awake/alert Orientation Level: Appears intact for tasks assessed Behavior During Session: [facility] for tasks performed    Extremity/Trunk Assessment Right Upper Extremity Assessment RUE ROM/Strength/Tone: [facility] for tasks assessed Left Upper Extremity Assessment LUE ROM/Strength/Tone: WFL for tasks assessed Right Lower Extremity Assessment RLE ROM/Strength/Tone: Deficits RLE ROM/Strength/Tone Deficits: strength 3+/5 RLE Sensation: WFL - Light Touch RLE Coordination: WFL - gross motor Left Lower Extremity Assessment LLE ROM/Strength/Tone: Deficits LLE ROM/Strength/Tone Deficits: strength 3+/5 except quad = 3-/5 LLE Sensation: WFL - Light Touch LLE Coordination: WFL - gross motor   Balance High Level Balance High Level Balance  Activites: Side stepping;Backward walking;Direction changes;Turns;Head turns High Level Balance Comments: no LOB with above  End of Session PT - End of  Session Equipment Utilized During Treatment: Gait belt Activity Tolerance: Patient tolerated treatment well Patient left: in chair;with chair alarm set Nurse Communication: Mobility status  GP     [name] 03/21/2012, 9:38 AM

## 2012-03-22 ENCOUNTER — Inpatient Hospital Stay (HOSPITAL_COMMUNITY): Payer: PRIVATE HEALTH INSURANCE

## 2012-03-22 DIAGNOSIS — Y92009 Unspecified place in unspecified non-institutional (private) residence as the place of occurrence of the external cause: Secondary | ICD-10-CM

## 2012-03-22 DIAGNOSIS — K5900 Constipation, unspecified: Secondary | ICD-10-CM

## 2012-03-22 DIAGNOSIS — W19XXXA Unspecified fall, initial encounter: Secondary | ICD-10-CM

## 2012-03-22 LAB — GLUCOSE, CAPILLARY: Glucose-Capillary: 242 mg/dL — ABNORMAL HIGH (ref 70–99)

## 2012-03-22 LAB — VITAMIN B12: Vitamin B-12: 327 pg/mL (ref 211–911)

## 2012-03-22 MED ORDER — SODIUM CHLORIDE 0.9 % IJ SOLN
INTRAMUSCULAR | Status: AC
  Administered 2012-03-22: 3 mL
  Filled 2012-03-22: qty 6

## 2012-03-22 MED ORDER — FLEET ENEMA 7-19 GM/118ML RE ENEM: 1.0000 | ENEMA | Freq: Every day | RECTAL | Status: DC | PRN

## 2012-03-22 MED ORDER — POLYETHYLENE GLYCOL 3350 17 G PO PACK: 17.0000 g | PACK | Freq: Every day | ORAL | Status: DC

## 2012-03-22 MED ORDER — BISACODYL 10 MG RE SUPP
10.0000 mg | Freq: Every day | RECTAL | Status: DC
  Administered 2012-03-22: 10 mg via RECTAL
  Filled 2012-03-22: qty 1

## 2012-03-22 MED ORDER — METOCLOPRAMIDE HCL 5 MG/ML IJ SOLN
10.0000 mg | Freq: Once | INTRAMUSCULAR | Status: AC
  Administered 2012-03-22: 10 mg via INTRAVENOUS
  Filled 2012-03-22: qty 2

## 2012-03-22 MED ORDER — PIOGLITAZONE HCL 15 MG PO TABS: 15.0000 mg | ORAL_TABLET | Freq: Every day | ORAL | Status: DC

## 2012-03-22 MED ORDER — ASPIRIN 81 MG PO CHEW
CHEWABLE_TABLET | ORAL | Status: AC
  Filled 2012-03-22: qty 1

## 2012-03-22 MED ORDER — PIOGLITAZONE HCL 30 MG PO TABS
ORAL_TABLET | ORAL | Status: AC
  Filled 2012-03-22: qty 1

## 2012-03-22 NOTE — Discharge Summary (Signed)
Physician Discharge Summary  Patient ID: Charles Gibbs MRN: [id number] DOB/AGE: January 23, 1928 78 y.o.  Admit date: 03/20/2012 Discharge date: 03/22/2012  Discharge Diagnoses:  Principal Problem:  *Dehydration Active Problems:  DM (diabetes mellitus), type 2, uncontrolled  Constipation  Fall at home     Medication List     As of 03/22/2012  1:59 PM    TAKE these medications         aspirin 81 MG chewable tablet   Chew 81 mg by mouth daily.      glipiZIDE-metformin 2.5-500 MG per tablet   Commonly known as: METAGLIP   Take 2 tablets by mouth 2 (two) times daily.      meclizine 25 MG tablet   Commonly known as: ANTIVERT   Take one every 6 hours for dizziness      One-A-Day 50 Plus Tabs   Take 1 tablet by mouth at bedtime.      pioglitazone 15 MG tablet   Commonly known as: ACTOS   Take 1 tablet (15 mg total) by mouth daily.            Discharge Orders    Future Orders Please Complete By Expires   Diet Carb Modified      Diet - low sodium heart healthy      Driving Restrictions      Comments:   No driving   Increase activity slowly      Increase activity slowly      Discharge instructions      Comments:   Drink plenty of liquid      Follow-up Information    Follow up with [facility].   Contact information:   [address] [place] [address] [phone]      Follow up with Rudi Heap, MD. In 1 week.   Contact information:   [address] [place] [address] [phone]          Disposition: 01-Home or Self Care with home RN and social worker  Discharged Condition: stable  Consults:  PT, social work  Labs:    Sodium   140          Potassium   3.6          Chloride   108          CO2   23          Mean Plasma Glucose   183          BUN   23          Creatinine, Ser   0.87          Calcium   8.2          GFR calc non Af Amer   79          GFR calc Af Amer   90 mL/min  The eGFR has been calculated  using the CKD EPI equation. This calculation has not been validated in all clinical situations. eGFR's persistently <90 mL/min signify possible Chronic Kidney Disease.">9090 mL/min  The eGFR has been calculated using the CKD EPI equation. This calculation has not been validated in all clinical situations. eGFR's persistently <90 mL/min signify possible Chronic Kidney Disease." border=0 src="file:///C:/PROGRAM%20FILES%20(X86)/EPICSYS/V7.8/EN-US/Images/IP_COMMENT_[email or web address].gif" width=5 height=10          Glucose, Bld   171          Alkaline Phosphatase   69          Albumin  3.3          AST   13          ALT   9          Total Protein   6.5          Total Bilirubin   0.4           CARDIAC PROFILE    Troponin I   <0.30  <0.30           OTHER CHEM    Vitamin B-12    327          CBC    WBC   8.0          RBC   4.15          Hemoglobin   13.1          HCT   37.0          MCV   89.2          MCH   31.6          MCHC   35.4          RDW   12.5          Platelets   176           DIFFERENTIAL    Neutrophils Relative   71          Lymphocytes Relative   18          Monocytes Relative   9          Eosinophils Relative   2          Basophils Relative   0          Neutro Abs   5.8          Lymphs Abs   1.5          Monocytes Absolute   0.7          Eosinophils Absolute   0.2          Basophils Absolute   0.0           DIABETES    Hemoglobin A1C   =6.5% Diagnostic of Diabetes Mellitus (if abnormal result is confirmed) 5.7-6.4% Increased risk of developing Diabetes Mellitus References:Diagnosis and Classification of Diabetes Mellitus,Diabetes Care,2011,34(Suppl 1):S62-S69 and Standards of Medical Care in .Marland Kitchen."8.0 =6.5% Diagnostic of Diabetes Mellitus (if abnormal result is confirmed) 5.7-6.4% Increased risk of developing Diabetes Mellitus References:Diagnosis and Classification of Diabetes Mellitus,Diabetes Care,2011,34(Suppl 1):S62-S69 and Standards of Medical Care in ..." border=0  src="file:///C:/PROGRAM%20FILES%20(X86)/EPICSYS/V7.8/EN-US/Images/IP_COMMENT_[email or web address].gif" width=5 height=10          Glucose, Bld   171           THYROID    TSH   0.839           URINALYSIS    Color, Urine   YELLOW          APPearance   CLEAR          Specific Gravity, Urine   1.010          pH   7.0          Glucose, UA   1000 mg/dL">1000          Bilirubin Urine   NEGATIVE          Ketones, ur   15          Protein, ur   TRACE  Urobilinogen, UA   0.2          Nitrite   NEGATIVE          Leukocytes, UA   NEGATIVE          Hgb urine dipstick   LARGE          WBC, UA   0-2          RBC / HPF   21-50            Diagnostics:  Dg Chest 2 View  03/20/2012  *RADIOLOGY REPORT*  Clinical Data: Weakness.  Diabetic.  Nonsmoker.  CHEST - 2 VIEW  Comparison: 12/26/2011 and 12/20/2009.  Findings: No infiltrate, congestive heart failure or pneumothorax. Heart size within normal limits.  Tortuous aorta.  IMPRESSION: No acute abnormality.   Original Report Authenticated By: Lacy Duverney, M.D.    Dg Abd 2 Views  03/22/2012  *RADIOLOGY REPORT*  Clinical Data: Abdominal pain and distention.  Nausea.  ABDOMEN - 2 VIEW  Comparison: None.  Findings: No evidence of dilated bowel loops.  Moderate stool burden noted.  Multiple radiopaque calculi are seen overlying the left kidney largest measuring 9 mm.  Other irregular calcifications are seen in the central pelvis measuring up to 11 mm which are suspicious for bladder calculi.  IMPRESSION:  1.  Multiple left renal calculi and probable bladder calculi. Consider abdomen pelvis CT for further evaluation. 2.  Unremarkable bowel gas pattern.   Original Report Authenticated By: Myles Rosenthal, M.D.    EKG: Normal sinus rhythm Left axis deviation Cannot rule out Anterior infarct (cited on or before 20-Mar-2012)  Full Code   Hospital Course: See H&P for complete admission details. The patient is an 78 year old white male who lives all. He fell and pressed  his life alert button. In the emergency room, he was found to be dehydrated and hyperglycemic. There was concern regarding him living alone. He was given IV hydration. Insulin. He received physical therapy. Social work consulted. He was forgetful but felt have capacity for informed consent. He was oriented. It was recommended he go to assisted living, but he refused. Therefore home nursing and social work has been arranged. He reportedly gets Meals on Wheels. The daughter was contacted regarding our concerns. He received insulin in addition to his home medications. However, he does not check his blood glucoses and is not felt to be safe for him to be on insulin at home. Therefore, I have added Actos to his regimen. This will likely need to be titrated upward. He also developed constipation while here which was treated. Total time on the day of discharge greater than 30 minutes.  Discharge Exam:  Blood pressure 167/75, pulse 85, temperature 97.7 F (36.5 C), temperature source Oral, resp. rate 20, height 5\' 5"  (1.651 m), weight 59 kg (130 lb 1.1 oz), SpO2 100.00%.  Alert, oriented, appropriate Abd soft, nontender  Signed: Rachele Lamaster L 03/22/2012, 1:59 PM

## 2012-03-22 NOTE — Progress Notes (Signed)
Pt will be discharged home via transportation. Phone call to Daughter "Paulette" Concerns about pt taking care of himself,despite offer of assisted living facility, which he turned down. Daughter told that there are options that would assist her in the care and possible placement of her Father, and she seemed receptive. Daughter promised that she or a neighbor would check in on the pt once he arrived home. Discharge instructions reviewed with pt. Copy sent with pt. Currently without pain or discomfort, ambulating well. Constipation issues relieved.

## 2012-03-22 NOTE — Progress Notes (Signed)
Pt complains of being constipated and that abdomen feels tight.Dr. Orvan Falconer called and asked for order to check for impaction.  Pt was found to not have stool in his rectum.  Pt's abdomen distended and taut. He voices that he feels like his can't empty his bladder.  Bladder scan reveals 109 ml.  Dr. Orvan Falconer notified of the above and no new orders were received.

## 2012-04-30 ENCOUNTER — Emergency Department (HOSPITAL_COMMUNITY): Payer: PRIVATE HEALTH INSURANCE

## 2012-04-30 ENCOUNTER — Emergency Department (HOSPITAL_COMMUNITY)
Admission: EM | Admit: 2012-04-30 | Discharge: 2012-05-01 | Disposition: A | Discharge status: Home / Self Care | Payer: PRIVATE HEALTH INSURANCE | Attending: Emergency Medicine | Admitting: Emergency Medicine

## 2012-04-30 ENCOUNTER — Encounter (HOSPITAL_COMMUNITY): Payer: Self-pay | Admitting: Emergency Medicine

## 2012-04-30 DIAGNOSIS — E1169 Type 2 diabetes mellitus with other specified complication: Secondary | ICD-10-CM | POA: Insufficient documentation

## 2012-04-30 DIAGNOSIS — E860 Dehydration: Secondary | ICD-10-CM | POA: Insufficient documentation

## 2012-04-30 DIAGNOSIS — R112 Nausea with vomiting, unspecified: Secondary | ICD-10-CM | POA: Insufficient documentation

## 2012-04-30 DIAGNOSIS — Z8669 Personal history of other diseases of the nervous system and sense organs: Secondary | ICD-10-CM | POA: Insufficient documentation

## 2012-04-30 DIAGNOSIS — Z79899 Other long term (current) drug therapy: Secondary | ICD-10-CM | POA: Insufficient documentation

## 2012-04-30 DIAGNOSIS — Z7982 Long term (current) use of aspirin: Secondary | ICD-10-CM | POA: Insufficient documentation

## 2012-04-30 DIAGNOSIS — R739 Hyperglycemia, unspecified: Secondary | ICD-10-CM

## 2012-04-30 LAB — URINALYSIS, ROUTINE W REFLEX MICROSCOPIC
Bilirubin Urine: NEGATIVE
Ketones, ur: 40 mg/dL — AB
Leukocytes, UA: NEGATIVE
Nitrite: NEGATIVE
Protein, ur: 100 mg/dL — AB
pH: 6 (ref 5.0–8.0)

## 2012-04-30 LAB — URINE MICROSCOPIC-ADD ON

## 2012-04-30 LAB — COMPREHENSIVE METABOLIC PANEL
ALT: 15 U/L (ref 0–53)
AST: 17 U/L (ref 0–37)
Alkaline Phosphatase: 66 U/L (ref 39–117)
CO2: 20 mEq/L (ref 19–32)
Calcium: 9 mg/dL (ref 8.4–10.5)
Chloride: 108 mEq/L (ref 96–112)
GFR calc Af Amer: 77 mL/min — ABNORMAL LOW (ref >90)
GFR calc non Af Amer: 67 mL/min — ABNORMAL LOW (ref >90)
Glucose, Bld: 330 mg/dL — ABNORMAL HIGH (ref 70–99)
Sodium: 144 mEq/L (ref 135–145)
Total Bilirubin: 0.8 mg/dL (ref 0.3–1.2)

## 2012-04-30 LAB — CBC WITH DIFFERENTIAL/PLATELET
Basophils Absolute: 0 10*3/uL (ref 0.0–0.1)
Basophils Relative: 0 % (ref 0–1)
Eosinophils Absolute: 0 10*3/uL (ref 0.0–0.7)
Hemoglobin: 14.2 g/dL (ref 13.0–17.0)
MCH: 31.5 pg (ref 26.0–34.0)
MCHC: 35 g/dL (ref 30.0–36.0)
Neutro Abs: 8.6 10*3/uL — ABNORMAL HIGH (ref 1.7–7.7)
Neutrophils Relative %: 90 % — ABNORMAL HIGH (ref 43–77)
Platelets: 159 10*3/uL (ref 150–400)
RDW: 12.7 % (ref 11.5–15.5)

## 2012-04-30 LAB — GLUCOSE, CAPILLARY: Glucose-Capillary: 205 mg/dL — ABNORMAL HIGH (ref 70–99)

## 2012-04-30 LAB — TROPONIN I: Troponin I: <0.3 ng/mL (ref <0.30)

## 2012-04-30 LAB — LACTIC ACID, PLASMA: Lactic Acid, Venous: 1.8 mmol/L (ref 0.5–2.2)

## 2012-04-30 MED ORDER — SODIUM CHLORIDE 0.9 % IV SOLN
Freq: Once | INTRAVENOUS | Status: AC
  Administered 2012-04-30: 18:00:00 via INTRAVENOUS

## 2012-04-30 MED ORDER — SODIUM CHLORIDE 0.9 % IV BOLUS (SEPSIS)
500.0000 mL | Freq: Once | INTRAVENOUS | Status: AC
  Administered 2012-04-30: 500 mL via INTRAVENOUS

## 2012-04-30 MED ORDER — SODIUM CHLORIDE 0.9 % IV BOLUS (SEPSIS)
1000.0000 mL | Freq: Once | INTRAVENOUS | Status: AC
  Administered 2012-04-30: 1000 mL via INTRAVENOUS

## 2012-04-30 NOTE — ED Notes (Signed)
Pt resting at this time.  Denies pain or SOB.  No distress noted at present.

## 2012-04-30 NOTE — ED Provider Notes (Signed)
History  This chart was scribed for Joya Gaskins, MD by Erskine Emery, ED Scribe. This patient was seen in room APA05/APA05 and the patient's care was started at 17:51.   CSN: [id number]  Arrival date & time 04/30/12  1735   First MD Initiated Contact with Patient 04/30/12 1751      Chief Complaint  Patient presents with  . Emesis   The history is provided by the patient and the EMS personnel. No language interpreter was used.  Charles Gibbs is a 78 y.o. male brought in by ambulance, who presents to the Emergency Department complaining of emesis since this morning. Pt reports he has not been able to keep any foods down but denies any associated diarrhea, chest pain, known fevers, headaches, abdominal pain, dysuria, LOC, cough, dizziness, or numbness or tingling in legs. Pt is diabetic; his CBG was 329 en route. EMS gave him 4mg  Zofran, after which he has another episode of emesis. Pt lives by himself. Nothing improves his symptoms His course is worsening  Dr. Christell Constant is the pt's PCP  Past Medical History  Diagnosis Date  . Diabetes mellitus   . Inner ear inflammation   . Vertigo     History reviewed. No pertinent past surgical history.  Family History  Problem Relation Age of Onset  . Stroke Mother   . Cancer Mother   . Heart failure Mother   . Heart failure Father     History  Substance Use Topics  . Smoking status: Never Smoker   . Smokeless tobacco: Never Used  . Alcohol Use: No      Review of Systems  Constitutional: Positive for appetite change.  Cardiovascular: Negative for chest pain.  Gastrointestinal: Positive for nausea and vomiting. Negative for abdominal pain and diarrhea.  Genitourinary: Negative for dysuria.  Neurological: Negative for dizziness, syncope, numbness and headaches.    Allergies  Review of patient's allergies indicates no known allergies.  Home Medications   Current Outpatient Rx  Name  Route  Sig  Dispense  Refill  . ASPIRIN  81 MG PO CHEW   Oral   Chew 81 mg by mouth daily.         Marland Kitchen GLIPIZIDE-METFORMIN HCL 2.5-500 MG PO TABS   Oral   Take 2 tablets by mouth 2 (two) times daily.         Marland Kitchen MECLIZINE HCL 25 MG PO TABS      Take one every 6 hours for dizziness   20 tablet   0   . ONE-A-DAY 50 PLUS PO TABS   Oral   Take 1 tablet by mouth at bedtime.         Marland Kitchen PIOGLITAZONE HCL 15 MG PO TABS   Oral   Take 1 tablet (15 mg total) by mouth daily.   30 tablet   0     BP 168/92  Pulse 88  Resp 18  SpO2 100% BP 168/92  Pulse 88  Temp 98.1 F (36.7 C) (Oral)  Resp 18  SpO2 100%  Physical Exam CONSTITUTIONAL: Well developed/well nourished HEAD AND FACE: Normocephalic/atraumatic EYES: EOMI/PERRL, no scleral icterus ENMT: Mucous membranes dry NECK: supple no meningeal signs SPINE:entire spine nontender CV: S1/S2 noted, no murmurs/rubs/gallops noted LUNGS: Lungs are clear to auscultation bilaterally, no apparent distress ABDOMEN: soft, nontender, no rebound or guarding GU:no cva tenderness NEURO: Pt is awake/alert, moves all extremitiesx4 EXTREMITIES: pulses normal, full ROM SKIN: warm, color normal PSYCH: no abnormalities of mood noted  ED Course  Procedures  DIAGNOSTIC STUDIES: Oxygen Saturation is 100% on room air, normal by my interpretation.    COORDINATION OF CARE: 18:04--I evaluated the patient and we discussed a treatment plan including IV fluids, blood work, urinalysis, and EKG to which the pt agreed.   19:07--I rechecked the pt. He denies any pains, cough, SOB, or rash. He has no focal abdominal tenderness His lactate is elevated but he is also dehydrated.  Will rehydrate and reassess  22:10--I rechecked the pt who is doing better, denies any significant pains, and has been able to ambulate. I notified him of his mostly normal labs and radiology. 10:50 PM Pt refuses placement in assisted living or other facility.  I reviewed his last admission and he refused at that  time.  He has a daughter that he sees weekly but the number he gave to nurse does not work.  He has no current complaints - denies cp/sob/abdominal pain/HA/weakness.  His labs have improved and his vitals are appropriate.  He is ambulatory for the nurse  Plan is to monitor overnight.  Plan to d/c in AM after discussing with daughter He is currently stable   Labs Reviewed  COMPREHENSIVE METABOLIC PANEL  CBC WITH DIFFERENTIAL  LIPASE, BLOOD  TROPONIN I  URINALYSIS, ROUTINE W REFLEX MICROSCOPIC     MDM  Nursing notes including past medical history and social history reviewed and considered in documentation Previous records reviewed and considered Labs/vital reviewed and considered       Date: 04/30/2012  Rate: 87  Rhythm: normal sinus rhythm  QRS Axis: left  Intervals: normal  ST/T Wave abnormalities: nonspecific ST changes  Conduction Disutrbances:none  Narrative Interpretation:   Old EKG Reviewed: unchanged Poor quality    Date: 04/30/2012 2211  Rate: 78  Rhythm: normal sinus rhythm  QRS Axis: left  Intervals: normal  ST/T Wave abnormalities: nonspecific ST changes  Conduction Disutrbances:none  Narrative Interpretation: poor quality which limits utility, no acute ST changes  Old EKG Reviewed: unchanged     I personally performed the services described in this documentation, which was scribed in my presence. The recorded information has been reviewed and is accurate.      Joya Gaskins, MD 05/01/12 [phone]

## 2012-04-30 NOTE — ED Notes (Signed)
Pt c/o vomiting that began this morning. Pt denies diarrhea. Pt is diabetic but has been unable to keep foods down. CBG 329. 20g IV in left forearm. Pt received 4mg  zofran via EMS. Pt had another episode of vomiting after zofran was given.

## 2012-05-01 NOTE — ED Notes (Addendum)
Per ED physician, pt to stay in department tonight until social worker can be involved to arrange for transportation home. Contact number for daughter is incorrect. No additional numbers for contacts located. Pt unable to drive, however does not meet criteria to be transported by EMS.

## 2012-05-01 NOTE — ED Notes (Signed)
[facility] contacted to pick up patient advised they will be here in about 30 minutes

## 2012-05-01 NOTE — ED Notes (Signed)
Pt ambulated to BR with no difficulty.  

## 2012-12-03 ENCOUNTER — Inpatient Hospital Stay (HOSPITAL_COMMUNITY): Payer: PRIVATE HEALTH INSURANCE

## 2012-12-03 ENCOUNTER — Inpatient Hospital Stay (HOSPITAL_COMMUNITY)
Admission: EM | Admit: 2012-12-03 | Discharge: 2012-12-12 | DRG: 637 | Disposition: A | Discharge status: Home Health | Payer: PRIVATE HEALTH INSURANCE | Attending: Family Medicine | Admitting: Family Medicine

## 2012-12-03 ENCOUNTER — Emergency Department (HOSPITAL_COMMUNITY): Payer: PRIVATE HEALTH INSURANCE

## 2012-12-03 ENCOUNTER — Encounter (HOSPITAL_COMMUNITY): Payer: Self-pay

## 2012-12-03 DIAGNOSIS — IMO0002 Reserved for concepts with insufficient information to code with codable children: Principal | ICD-10-CM

## 2012-12-03 DIAGNOSIS — I10 Essential (primary) hypertension: Secondary | ICD-10-CM

## 2012-12-03 DIAGNOSIS — E860 Dehydration: Secondary | ICD-10-CM

## 2012-12-03 DIAGNOSIS — Z91199 Patient's noncompliance with other medical treatment and regimen due to unspecified reason: Secondary | ICD-10-CM

## 2012-12-03 DIAGNOSIS — Z8249 Family history of ischemic heart disease and other diseases of the circulatory system: Secondary | ICD-10-CM

## 2012-12-03 DIAGNOSIS — Y92009 Unspecified place in unspecified non-institutional (private) residence as the place of occurrence of the external cause: Secondary | ICD-10-CM

## 2012-12-03 DIAGNOSIS — Z809 Family history of malignant neoplasm, unspecified: Secondary | ICD-10-CM

## 2012-12-03 DIAGNOSIS — R0902 Hypoxemia: Secondary | ICD-10-CM | POA: Diagnosis present

## 2012-12-03 DIAGNOSIS — IMO0001 Reserved for inherently not codable concepts without codable children: Secondary | ICD-10-CM

## 2012-12-03 DIAGNOSIS — R319 Hematuria, unspecified: Secondary | ICD-10-CM | POA: Diagnosis present

## 2012-12-03 DIAGNOSIS — E1165 Type 2 diabetes mellitus with hyperglycemia: Secondary | ICD-10-CM

## 2012-12-03 DIAGNOSIS — Z794 Long term (current) use of insulin: Secondary | ICD-10-CM

## 2012-12-03 DIAGNOSIS — D696 Thrombocytopenia, unspecified: Secondary | ICD-10-CM | POA: Diagnosis present

## 2012-12-03 DIAGNOSIS — G9340 Encephalopathy, unspecified: Secondary | ICD-10-CM | POA: Diagnosis present

## 2012-12-03 DIAGNOSIS — R339 Retention of urine, unspecified: Secondary | ICD-10-CM

## 2012-12-03 DIAGNOSIS — Z9119 Patient's noncompliance with other medical treatment and regimen: Secondary | ICD-10-CM

## 2012-12-03 DIAGNOSIS — W19XXXA Unspecified fall, initial encounter: Secondary | ICD-10-CM

## 2012-12-03 DIAGNOSIS — E876 Hypokalemia: Secondary | ICD-10-CM

## 2012-12-03 DIAGNOSIS — Z9181 History of falling: Secondary | ICD-10-CM

## 2012-12-03 DIAGNOSIS — E11649 Type 2 diabetes mellitus with hypoglycemia without coma: Secondary | ICD-10-CM

## 2012-12-03 DIAGNOSIS — M6282 Rhabdomyolysis: Secondary | ICD-10-CM

## 2012-12-03 DIAGNOSIS — N210 Calculus in bladder: Secondary | ICD-10-CM

## 2012-12-03 DIAGNOSIS — E785 Hyperlipidemia, unspecified: Secondary | ICD-10-CM | POA: Diagnosis present

## 2012-12-03 DIAGNOSIS — Z823 Family history of stroke: Secondary | ICD-10-CM

## 2012-12-03 HISTORY — DX: Calculus in bladder: N21.0

## 2012-12-03 HISTORY — DX: Essential (primary) hypertension: I10

## 2012-12-03 HISTORY — DX: Retention of urine, unspecified: R33.9

## 2012-12-03 LAB — CBC WITH DIFFERENTIAL/PLATELET
HCT: 42.3 % (ref 39.0–52.0)
Hemoglobin: 15.3 g/dL (ref 13.0–17.0)
Lymphocytes Relative: 7 % — ABNORMAL LOW (ref 12–46)
MCHC: 36.2 g/dL — ABNORMAL HIGH (ref 30.0–36.0)
MCV: 86.7 fL (ref 78.0–100.0)
Monocytes Absolute: 1.1 10*3/uL — ABNORMAL HIGH (ref 0.1–1.0)
Monocytes Relative: 7 % (ref 3–12)
Neutro Abs: 13.7 10*3/uL — ABNORMAL HIGH (ref 1.7–7.7)
WBC: 15.9 10*3/uL — ABNORMAL HIGH (ref 4.0–10.5)

## 2012-12-03 LAB — GLUCOSE, CAPILLARY
Glucose-Capillary: 401 mg/dL — ABNORMAL HIGH (ref 70–99)
Glucose-Capillary: 189 mg/dL — ABNORMAL HIGH (ref 70–99)

## 2012-12-03 LAB — URINE MICROSCOPIC-ADD ON

## 2012-12-03 LAB — COMPREHENSIVE METABOLIC PANEL
BUN: 29 mg/dL — ABNORMAL HIGH (ref 6–23)
CO2: 19 mEq/L (ref 19–32)
Chloride: 98 mEq/L (ref 96–112)
Creatinine, Ser: 1.18 mg/dL (ref 0.50–1.35)
GFR calc Af Amer: 64 mL/min — ABNORMAL LOW (ref >90)
GFR calc non Af Amer: 56 mL/min — ABNORMAL LOW (ref >90)
Total Bilirubin: 1.2 mg/dL (ref 0.3–1.2)

## 2012-12-03 LAB — URINALYSIS, ROUTINE W REFLEX MICROSCOPIC
Leukocytes, UA: NEGATIVE
Protein, ur: 100 mg/dL — AB
Urobilinogen, UA: 0.2 mg/dL (ref 0.0–1.0)

## 2012-12-03 LAB — TROPONIN I: Troponin I: <0.3 ng/mL (ref <0.30)

## 2012-12-03 MED ORDER — INSULIN ASPART 100 UNIT/ML ~~LOC~~ SOLN: 0.0000 [IU] | Freq: Three times a day (TID) | SUBCUTANEOUS | Status: DC

## 2012-12-03 MED ORDER — INSULIN ASPART 100 UNIT/ML ~~LOC~~ SOLN
10.0000 [IU] | Freq: Once | SUBCUTANEOUS | Status: AC
  Administered 2012-12-03: 10 [IU] via SUBCUTANEOUS
  Filled 2012-12-03: qty 1

## 2012-12-03 MED ORDER — ONDANSETRON HCL 4 MG PO TABS: 4.0000 mg | ORAL_TABLET | Freq: Four times a day (QID) | ORAL | Status: DC | PRN

## 2012-12-03 MED ORDER — SODIUM CHLORIDE 0.9 % IV BOLUS (SEPSIS)
1000.0000 mL | Freq: Once | INTRAVENOUS | Status: AC
  Administered 2012-12-03: 1000 mL via INTRAVENOUS

## 2012-12-03 MED ORDER — INSULIN ASPART 100 UNIT/ML ~~LOC~~ SOLN: 0.0000 [IU] | Freq: Every day | SUBCUTANEOUS | Status: DC

## 2012-12-03 MED ORDER — GLIMEPIRIDE 2 MG PO TABS
4.0000 mg | ORAL_TABLET | Freq: Every morning | ORAL | Status: DC
  Filled 2012-12-03 (×2): qty 1

## 2012-12-03 MED ORDER — HEPARIN SODIUM (PORCINE) 5000 UNIT/ML IJ SOLN
5000.0000 [IU] | Freq: Three times a day (TID) | INTRAMUSCULAR | Status: DC
  Administered 2012-12-03 – 2012-12-06 (×10): 5000 [IU] via SUBCUTANEOUS
  Filled 2012-12-03 (×10): qty 1

## 2012-12-03 MED ORDER — INSULIN GLARGINE 100 UNIT/ML ~~LOC~~ SOLN
50.0000 [IU] | Freq: Every day | SUBCUTANEOUS | Status: DC
  Administered 2012-12-03: 50 [IU] via SUBCUTANEOUS
  Filled 2012-12-03 (×3): qty 0.5

## 2012-12-03 MED ORDER — ASPIRIN 81 MG PO CHEW
81.0000 mg | CHEWABLE_TABLET | Freq: Every morning | ORAL | Status: DC
  Administered 2012-12-04 – 2012-12-12 (×9): 81 mg via ORAL
  Filled 2012-12-03 (×9): qty 1

## 2012-12-03 MED ORDER — ATORVASTATIN CALCIUM 10 MG PO TABS
10.0000 mg | ORAL_TABLET | Freq: Every morning | ORAL | Status: DC
Start: 2012-12-04 — End: 2012-12-04
  Administered 2012-12-04: 10 mg via ORAL
  Filled 2012-12-03: qty 1

## 2012-12-03 MED ORDER — SODIUM CHLORIDE 0.9 % IV SOLN
INTRAVENOUS | Status: DC
  Administered 2012-12-03 – 2012-12-05 (×4): via INTRAVENOUS

## 2012-12-03 MED ORDER — ONDANSETRON HCL 4 MG/2ML IJ SOLN: 4.0000 mg | Freq: Four times a day (QID) | INTRAMUSCULAR | Status: DC | PRN

## 2012-12-03 MED ORDER — INSULIN GLARGINE 100 UNIT/ML ~~LOC~~ SOLN
SUBCUTANEOUS | Status: AC
  Filled 2012-12-03: qty 10

## 2012-12-03 NOTE — ED Notes (Signed)
Patient pulled up in bed and meal given

## 2012-12-03 NOTE — ED Notes (Signed)
Pt states he was bending over yesterday to pick something up and lost his balance. States he was in the floor from 0900 to 3pm before his daughter found him. States he has been vomiting this morning and his blood sugar is up.

## 2012-12-03 NOTE — ED Notes (Signed)
Dr Estell Harpin at bedside, assisted pt to ambulate. Pt ambulated poorly with unsteady gait and c/o feeling nauseated. No emesis at this time. Pt sneezing several times in a row.

## 2012-12-03 NOTE — ED Notes (Signed)
Pt presents from EMS, pt reports falling yesterday and thinks he laid there all night until a neighbor came. Pt denies injury at this time, however pt has visible skin tear to rt elbow and multiple bruising noted in various stages of healing. Pt states he lives alone, and his daughter occasionally checks on him. Pt is noted to have very poor hygiene, dried feces noted on pt's feet and legs.  Pt denies pain at this time. Pt is confused about time, oriented x 3.  PIV started with NS infusing per EMs

## 2012-12-03 NOTE — ED Notes (Signed)
BGL 401

## 2012-12-03 NOTE — ED Notes (Signed)
Pt given sponge by by ED Tech, tolerated well. Pt removed from dirty and soiled clothing.

## 2012-12-03 NOTE — ED Provider Notes (Signed)
History  This chart was scribed for Charles Gibbs B. Bernette Mayers, MD by Bennett Scrape, ED Scribe. This patient was seen in room APA11/APA11 and the patient's care was started at 11:56 AM.  CSN: [id number] Arrival date & time 12/03/12  1115  First MD Initiated Contact with Patient 12/03/12 1156     Chief Complaint  Patient presents with  . Hyperglycemia    The history is provided by the patient. No language interpreter was used.   HPI Comments: Charles Gibbs is a 79 y.o. male with a h/o DM brought in by ambulance, who presents to the Emergency Department complaining of hyperglycemia with associated nausea that he noticed today. Pt states that he bent over to pick up an object out of the floor and fell forward. He reports that he laid in the floor from 9 AM yesterday to sometime early this morning when someone came to check on him. He takes insulin for his DM and states that he missed 2 doses because of this. CBG is 409 in the ED. He denies CP, abdominal pain and emesis as associated symptoms.  Lives by himself    Past Medical History  Diagnosis Date  . Diabetes mellitus   . Inner ear inflammation   . Vertigo    History reviewed. No pertinent past surgical history. Family History  Problem Relation Age of Onset  . Stroke Mother   . Cancer Mother   . Heart failure Mother   . Heart failure Father    History  Substance Use Topics  . Smoking status: Never Smoker   . Smokeless tobacco: Never Used  . Alcohol Use: No    Review of Systems  A complete 10 system review of systems was obtained and all systems are negative except as noted in the HPI and PMH.   Allergies  Review of patient's allergies indicates no known allergies.  Home Medications   Current Outpatient Rx  Name  Route  Sig  Dispense  Refill  . aspirin 81 MG chewable tablet   Oral   Chew 81 mg by mouth every morning.          Marland Kitchen atorvastatin (LIPITOR) 10 MG tablet   Oral   Take 10 mg by mouth every morning.          Marland Kitchen glimepiride (AMARYL) 4 MG tablet   Oral   Take 4 mg by mouth every morning.         Marland Kitchen glipiZIDE-metformin (METAGLIP) 2.5-500 MG per tablet   Oral   Take 2 tablets by mouth 2 (two) times daily.         . insulin glargine (LANTUS) 100 UNIT/ML injection   Subcutaneous   Inject 50 Units into the skin daily.         . meclizine (ANTIVERT) 25 MG tablet      Take one every 6 hours for dizziness   20 tablet   0   . Multiple Vitamins-Minerals (ONE-A-DAY 50 PLUS) TABS   Oral   Take 1 tablet by mouth at bedtime.          Triage Vitals: BP 218/85  Pulse 86  Temp(Src) 98.1 F (36.7 C) (Oral)  Resp 23  Ht 5\' 6"  (1.676 m)  SpO2 100%  Physical Exam  Nursing note and vitals reviewed. Constitutional: He is oriented to person, place, and time. He appears well-developed and well-nourished.  HENT:  Head: Normocephalic and atraumatic.  Eyes: EOM are normal. Pupils are equal, round, and reactive to  light.  Neck: Normal range of motion. Neck supple.  Cardiovascular: Normal rate, regular rhythm, normal heart sounds and intact distal pulses.   Pulmonary/Chest: Effort normal and breath sounds normal.  Abdominal: Bowel sounds are normal. He exhibits no distension. There is no tenderness.  Musculoskeletal: Normal range of motion. He exhibits no edema and no tenderness.  Neurological: He is alert and oriented to person, place, and time. He has normal strength. No cranial nerve deficit or sensory deficit.  Skin: Skin is warm and dry. No rash noted.  Psychiatric: He has a normal mood and affect.    ED Course  Procedures (including critical care time)  DIAGNOSTIC STUDIES: Oxygen Saturation is 100% on room air, normal by my interpretation.    COORDINATION OF CARE: 12:45 PM-Discussed treatment plan which includes CXR, CBC panel, CMP and UA with pt at bedside and pt agreed to plan.   Labs Reviewed  GLUCOSE, CAPILLARY - Abnormal; Notable for the following:    Glucose-Capillary 401  (*)    All other components within normal limits  GLUCOSE, CAPILLARY - Abnormal; Notable for the following:    Glucose-Capillary 379 (*)    All other components within normal limits  CBC WITH DIFFERENTIAL - Abnormal; Notable for the following:    WBC 15.9 (*)    MCHC 36.2 (*)    Neutrophils Relative % 86 (*)    Neutro Abs 13.7 (*)    Lymphocytes Relative 7 (*)    Monocytes Absolute 1.1 (*)    All other components within normal limits  COMPREHENSIVE METABOLIC PANEL  URINALYSIS, ROUTINE W REFLEX MICROSCOPIC  TROPONIN I  CK   Dg Chest 2 View  12/03/2012   *RADIOLOGY REPORT*  Clinical Data: Status post fall.  CHEST - 2 VIEW  Comparison: Acute abdominal series 04/30/2012.  Findings: The heart size and mediastinal contours are stable. There is no evidence of mediastinal hematoma.  The lungs are clear. There is no pleural effusion or pneumothorax. Degenerative changes of the thoracic spine are noted.  No acute osseous findings are seen.  IMPRESSION: No active cardiopulmonary process or post-traumatic findings demonstrated.   Original Report Authenticated By: Carey Bullocks, M.D.    No diagnosis found.  MDM   Date: 12/03/2012  Rate: 90  Rhythm: normal sinus rhythm  QRS Axis: normal  Intervals: QT prolonged  ST/T Wave abnormalities: nonspecific T wave changes  Conduction Disutrbances:none  Narrative Interpretation:   Old EKG Reviewed: unchanged  Pt feeling better, care signed out to Dr. Estell Harpin pending Urinalysis.   I personally performed the services described in this documentation, which was scribed in my presence. The recorded information has been reviewed and is accurate.      Anden Bartolo B. Bernette Mayers, MD 12/03/12 1527

## 2012-12-03 NOTE — H&P (Signed)
[facility] History and Physical  JYMIR DUNAJ ZOX:[id number] DOB: 10-30-1927 DOA: 12/03/2012  Referring physician: ER. PCP: Rudi Heap, MD    Chief Complaint: Fall, hyperglycemia.  HPI: Charles Gibbs is a 79 y.o. male who presents after he had a fall in his home at 59 AM yesterday. He did not lose consciousness. For some reason, that he cannot really explain, he says he did not have the strength to get up off the floor and he lay on the floor until this afternoon. He denies any weakness on one side of his body. There are no speech difficulties. When he presented to the emergency room, he was found to be dehydrated and hyperglycemic. He is now being admitted for further management. He denies any chest pain, palpitations or dyspnea.  Review of Systems: Apart from history of present illness, other systems negative.  Past Medical History  Diagnosis Date  . Diabetes mellitus   . Inner ear inflammation   . Vertigo    History reviewed. No pertinent past surgical history. Social History:  He lives on his own. He does not smoke cigarettes. He does not drink alcohol. He seems to manage ADLs by himself. His daughter comes to his house to give him insulin every day.   No Known Allergies  Family History  Problem Relation Age of Onset  . Stroke Mother   . Cancer Mother   . Heart failure Mother   . Heart failure Father       Prior to Admission medications   Medication Sig Start Date End Date Taking? Authorizing Provider  aspirin 81 MG chewable tablet Chew 81 mg by mouth every morning.     Historical Provider, MD  atorvastatin (LIPITOR) 10 MG tablet Take 10 mg by mouth every morning.    Historical Provider, MD  glimepiride (AMARYL) 4 MG tablet Take 4 mg by mouth every morning.    Historical Provider, MD  glipiZIDE-metformin (METAGLIP) 2.5-500 MG per tablet Take 2 tablets by mouth 2 (two) times daily.    Historical Provider, MD  insulin glargine (LANTUS) 100 UNIT/ML injection  Inject 50 Units into the skin daily.    Historical Provider, MD  meclizine (ANTIVERT) 25 MG tablet Take one every 6 hours for dizziness 02/24/12   [name], MD  Multiple Vitamins-Minerals (ONE-A-DAY 50 PLUS) TABS Take 1 tablet by mouth at bedtime.    Historical Provider, MD   Physical Exam: Filed Vitals:   12/03/12 1116 12/03/12 1122 12/03/12 1712  BP: 218/85  153/85  Pulse: 86  87  Temp: 98.1 F (36.7 C)    TempSrc: Oral    Resp: 23  20  Height:  5\' 6"  (1.676 m)   SpO2: 100%  99%     General:  He looks clinically dehydrated.  Eyes: No pallor. No jaundice.  ENT: No abnormalities.  Neck: No lymphadenopathy.  Cardiovascular: Heart sounds are present without murmurs or added sounds.  Respiratory: Lung fields are clear.  Abdomen: Soft, nontender. No masses felt.  Skin: No rash.  Musculoskeletal: No acute joint abnormalities.  Psychiatric: Appropriate affect.  Neurologic: Alert and orientated without any focal neurological signs.  Labs on Admission:  Basic Metabolic Panel:  Recent Labs Lab 12/03/12 1237  NA 138  K 3.9  CL 98  CO2 19  GLUCOSE 423*  BUN 29*  CREATININE 1.18  CALCIUM 9.2   Liver Function Tests:  Recent Labs Lab 12/03/12 1237  AST 18  ALT 10  ALKPHOS 76  BILITOT  1.2  PROT 7.2  ALBUMIN 3.7     CBC:  Recent Labs Lab 12/03/12 1237  WBC 15.9*  NEUTROABS 13.7*  HGB 15.3  HCT 42.3  MCV 86.7  PLT 153   Cardiac Enzymes:  Recent Labs Lab 12/03/12 1237  CKTOTAL 357*  TROPONINI <0.30     CBG:  Recent Labs Lab 12/03/12 1121 12/03/12 1207 12/03/12 1512  GLUCAP 401* 379* 344*    Radiological Exams on Admission: Dg Chest 2 View  12/03/2012   *RADIOLOGY REPORT*  Clinical Data: Status post fall.  CHEST - 2 VIEW  Comparison: Acute abdominal series 04/30/2012.  Findings: The heart size and mediastinal contours are stable. There is no evidence of mediastinal hematoma.  The lungs are clear. There is no pleural effusion or  pneumothorax. Degenerative changes of the thoracic spine are noted.  No acute osseous findings are seen.  IMPRESSION: No active cardiopulmonary process or post-traumatic findings demonstrated.   Original Report Authenticated By: Carey Bullocks, M.D.    EKG: Independently reviewed. Normal sinus rhythm, nonspecific ST-T wave changes, no acute ST elevation.  Assessment/Plan   1. Dehydration. 2. Uncontrolled type 2 diabetes mellitus. No evidence of DKA. 3. Hematuria. No evidence of UTI on urinalysis.  Plan: 1. Admit to medical floor. 2. Intravenous fluids. 3. Subcutaneous insulin and oral hypoglycemic agent for diabetes disease. 4. Sliding scale of insulin. 5. Ultrasound of the kidneys in view of hematuria. He may need urology referral. 6. Physical therapy evaluation. Further recommendations will depend on patient's hospital progress.   Code Status: Full code.   Family Communication: Discussed plan with patient at the bedside.   Disposition Plan: Home when medically stable. Patient does not wish to go to a skilled nursing facility.   Time spent: 45 minutes.  Wilson Singer [facility] Pager [phone].  If 7PM-7AM, please contact night-coverage [email or web address] Password [facility] 12/03/2012, 5:27 PM

## 2012-12-03 NOTE — ED Notes (Signed)
EDP notified of glucose.

## 2012-12-04 ENCOUNTER — Encounter (HOSPITAL_COMMUNITY): Payer: Self-pay | Admitting: Internal Medicine

## 2012-12-04 DIAGNOSIS — W19XXXA Unspecified fall, initial encounter: Secondary | ICD-10-CM

## 2012-12-04 DIAGNOSIS — E11649 Type 2 diabetes mellitus with hypoglycemia without coma: Secondary | ICD-10-CM | POA: Diagnosis not present

## 2012-12-04 DIAGNOSIS — Y92009 Unspecified place in unspecified non-institutional (private) residence as the place of occurrence of the external cause: Secondary | ICD-10-CM

## 2012-12-04 DIAGNOSIS — E876 Hypokalemia: Secondary | ICD-10-CM | POA: Diagnosis not present

## 2012-12-04 DIAGNOSIS — R339 Retention of urine, unspecified: Secondary | ICD-10-CM

## 2012-12-04 DIAGNOSIS — M6282 Rhabdomyolysis: Secondary | ICD-10-CM

## 2012-12-04 HISTORY — DX: Retention of urine, unspecified: R33.9

## 2012-12-04 LAB — CBC
Hemoglobin: 13.8 g/dL (ref 13.0–17.0)
MCHC: 35.9 g/dL (ref 30.0–36.0)
RBC: 4.43 MIL/uL (ref 4.22–5.81)
RDW: 12.9 % (ref 11.5–15.5)
WBC: 16.8 10*3/uL — ABNORMAL HIGH (ref 4.0–10.5)

## 2012-12-04 LAB — VITAMIN B12: Vitamin B-12: 462 pg/mL (ref 211–911)

## 2012-12-04 LAB — GLUCOSE, CAPILLARY
Glucose-Capillary: 42 mg/dL — CL (ref 70–99)
Glucose-Capillary: 69 mg/dL — ABNORMAL LOW (ref 70–99)
Glucose-Capillary: 110 mg/dL — ABNORMAL HIGH (ref 70–99)
Glucose-Capillary: 48 mg/dL — ABNORMAL LOW (ref 70–99)
Glucose-Capillary: 179 mg/dL — ABNORMAL HIGH (ref 70–99)

## 2012-12-04 LAB — TSH: TSH: 0.679 u[IU]/mL (ref 0.350–4.500)

## 2012-12-04 LAB — COMPREHENSIVE METABOLIC PANEL
BUN: 28 mg/dL — ABNORMAL HIGH (ref 6–23)
CO2: 25 mEq/L (ref 19–32)
Calcium: 8.4 mg/dL (ref 8.4–10.5)
Creatinine, Ser: 1.18 mg/dL (ref 0.50–1.35)
GFR calc Af Amer: 64 mL/min — ABNORMAL LOW (ref >90)
GFR calc non Af Amer: 56 mL/min — ABNORMAL LOW (ref >90)
Glucose, Bld: 49 mg/dL — ABNORMAL LOW (ref 70–99)

## 2012-12-04 LAB — CK: Total CK: 1022 U/L — ABNORMAL HIGH (ref 7–232)

## 2012-12-04 MED ORDER — INSULIN DETEMIR 100 UNIT/ML ~~LOC~~ SOLN
10.0000 [IU] | Freq: Every day | SUBCUTANEOUS | Status: DC
  Administered 2012-12-04: 10 [IU] via SUBCUTANEOUS
  Filled 2012-12-04: qty 0.1

## 2012-12-04 MED ORDER — INSULIN ASPART 100 UNIT/ML ~~LOC~~ SOLN
0.0000 [IU] | Freq: Three times a day (TID) | SUBCUTANEOUS | Status: DC
  Administered 2012-12-04: 2 [IU] via SUBCUTANEOUS
  Administered 2012-12-05: 1 [IU] via SUBCUTANEOUS

## 2012-12-04 MED ORDER — INSULIN ASPART 100 UNIT/ML ~~LOC~~ SOLN
0.0000 [IU] | Freq: Every day | SUBCUTANEOUS | Status: DC
Start: 2012-12-04 — End: 2012-12-06

## 2012-12-04 MED ORDER — POTASSIUM CHLORIDE CRYS ER 20 MEQ PO TBCR
30.0000 meq | EXTENDED_RELEASE_TABLET | Freq: Two times a day (BID) | ORAL | Status: DC
  Administered 2012-12-04: 30 meq via ORAL
  Filled 2012-12-04: qty 1

## 2012-12-04 NOTE — Progress Notes (Signed)
UR chart review completed.  

## 2012-12-04 NOTE — Progress Notes (Signed)
14 french foley catheter inserted due to c/o of urinary retention/bladder distention. 1300 cc of urine returned.

## 2012-12-04 NOTE — Progress Notes (Signed)
Inpatient Diabetes Program Recommendations  AACE/ADA: New Consensus Statement on Inpatient Glycemic Control (2013)  Target Ranges:  Prepandial:   less than 140 mg/dL      Peak postprandial:   less than 180 mg/dL (1-2 hours)      Critically ill patients:  140 - 180 mg/dL   Results for DHAVAL, WOO (MRN [id number]) as of 12/04/2012 14:36  Ref. Range 12/03/2012 12:07 12/03/2012 15:12 12/03/2012 19:40 12/03/2012 20:41 12/04/2012 07:40 12/04/2012 08:23 12/04/2012 08:41 12/04/2012 11:02  Glucose-Capillary Latest Range: 70-99 mg/dL 308 (H) 657 (H) 846 (H) 189 (H) 42 (LL) 69 (L) 110 (H) 170 (H)    Inpatient Diabetes Program Recommendations Insulin - Basal: Please consider ordering Levemir 30 units QHS.  Note: Patient has a history of diabetes and presented to the hospital with a blood glucose of 401 mg/dl.  Blood glucose over the past 24 hours has ranged from 42-379 mg/dl.  Patient received Lantus 50 units at 19:31 on 12/03/12 and fasting blood glucose noted to be 42 mg/dl.  At this time, there is no basal insulin ordered.   Talked with the patient and he reports that he does take Levemir at home along with other oral agents for diabetes control.  However, he is not able to tell me how much Levemir he takes.  He reports that his daughter gives it to him.  Called the number on file for patient's daughter Burton Apley - [phone]) and got a recording that the number was [employer] and there was not anyone there by that name.  Please consider ordering Levemir 30 units QHS for inpatient glycemic control.  Will continue to follow.  Thanks, Orlando Penner, RN, MSN, CCRN Diabetes Coordinator Inpatient Diabetes Program [phone]

## 2012-12-04 NOTE — Progress Notes (Signed)
CBG this am was 42.  Pt alert and oriented. No complaints of any distress. Breakfast given and blood sugar was rechecked and it was 110. Will continue to monitor. MD made aware.

## 2012-12-04 NOTE — Evaluation (Signed)
Physical Therapy Evaluation Patient Details Name: Charles Gibbs MRN: [id number] DOB: [birth year] Today's Date: 12/04/2012 Time: 0906-1002 PT Time Calculation (min): 56 min  PT Assessment / Plan / Recommendation History of Present Illness   Pt is admitted after a fall at home, dehydration and uncontrolled blood glucose levels.  Clinical Impression  Pt lives alone with support of daughter.  He is alert and oriented and very cooperative.  His blood glucose levels have apparently not been under good control...daughter states that a church friend has been bringing him sweets (like a large blueberry cobbler that he ate entirely) and that this has been compounding the problem.  From a functional perspective, pt appears to be deconditioned and needs mod assistance to transfer supine to sit and sit to stand.  He flatly refuses to consider ACLF at d/c.  I am recommending HHPT, BSC over the regular commode and bed risers  For surface elevation.  He also would benefit from having a walker in the home to be used when outside of the home.    PT Assessment  Patient needs continued PT services    Follow Up Recommendations  Home health PT    Does the patient have the potential to tolerate intense rehabilitation    no  Barriers to Discharge Decreased caregiver support daughter states that she is working on getting someone in the home to assist pt    Equipment Recommendations  Rolling walker with 5" wheels (BSC)    Recommendations for Other Services     Frequency Min 3X/week    Precautions / Restrictions Precautions Precautions: Fall Restrictions Weight Bearing Restrictions: No   Pertinent Vitals/Pain       Mobility  Bed Mobility Bed Mobility: Supine to Sit Supine to Sit: 3: Mod assist;HOB flat Transfers Transfers: Sit to Stand;Stand to Sit Sit to Stand: 3: Mod assist;With upper extremity assist;From bed Stand to Sit: 5: Supervision;To chair/3-in-1 Details for Transfer Assistance: pt has  difficulty  standing from low seat heights...a BSC over commode and bed risers on his bed would improve this situation Ambulation/Gait Ambulation/Gait Assistance: 5: Supervision Ambulation Distance (Feet): 175 Feet Assistive device: None Gait Pattern: Decreased stride length;Decreased hip/knee flexion - right;Decreased hip/knee flexion - left;Right foot flat;Left foot flat;Trunk flexed Gait velocity: slow General Gait Details: pt had no LOB during gait, but quick reflex time is decreased due to general joint stiffness Stairs: No Wheelchair Mobility Wheelchair Mobility: No    Exercises General Exercises - Lower Extremity Ankle Circles/Pumps: AROM;Both;10 reps;Supine Heel Slides: AROM;Both;10 reps;Supine Hip ABduction/ADduction: AROM;Both;10 reps;Supine   PT Diagnosis: Abnormality of gait;Generalized weakness  PT Problem List: Decreased balance;Decreased mobility;Decreased strength PT Treatment Interventions: Gait training;Functional mobility training;Therapeutic activities;Therapeutic exercise;Balance training     PT Goals(Current goals can be found in the care plan section) Acute Rehab PT Goals Patient Stated Goal: wants to return home...will not consider ALF PT Goal Formulation: With patient/family Time For Goal Achievement: 12/18/12 Potential to Achieve Goals: Good  Visit Information  Last PT Received On: 12/04/12       Prior Functioning  Home Living Family/patient expects to be discharged to:: Private residence Living Arrangements: Alone Available Help at Discharge: Family Type of Home: House Home Access: Level entry Home Layout: One level Home Equipment: Cane - single point;Grab bars - tub/shower;Shower seat - built in;Hand held shower head Additional Comments: pt reports that he doesn't use any AD for gait Prior Function Level of Independence: Independent Comments: per pt report Communication Communication: HOH    Cognition  Cognition Arousal/Alertness:  Awake/alert Behavior During Therapy: WFL for tasks assessed/performed Overall Cognitive Status: Within Functional Limits for tasks assessed    Extremity/Trunk Assessment Lower Extremity Assessment Lower Extremity Assessment: Overall WFL for tasks assessed Cervical / Trunk Assessment Cervical / Trunk Assessment: Kyphotic   Balance Balance Balance Assessed: Yes High Level Balance High Level Balance Activites: Side stepping;Backward walking;Direction changes;Turns;Sudden stops;Head turns High Level Balance Comments: no LOB with above activities in a controlled setting, but I sense that he might have some difficulty in a real world setting  End of Session PT - End of Session Equipment Utilized During Treatment: Gait belt Activity Tolerance: Patient tolerated treatment well Patient left: in chair;with call bell/phone within reach;with chair alarm set;with family/visitor present Nurse Communication: Mobility status  GP     [name] 12/04/2012, 10:27 AM

## 2012-12-04 NOTE — Clinical Social Work Note (Signed)
CSW received referral for possible placement. Per H&P, pt refused to consider SNF with MD yesterday. Today, he was evaluated by PT and recommendation is for home health. PT discussed option of ALF with pt and pt's daughter and daughter reports she has tried, but pt refuses. She is working on assistance for pt several hours a day at home. CSW will sign off as pt has clearly stated to several staff that he is not interested in placement but can be reconsulted if needed.  Derenda Fennel, [place] [phone]

## 2012-12-04 NOTE — Care Management Note (Signed)
    Page 1 of 2   12/12/2012     3:50:44 PM   CARE MANAGEMENT NOTE 12/12/2012  Patient:  MAXEN, ROWLAND   Account Number:  [id number]  Date Initiated:  12/04/2012  Documentation initiated by:  Sharrie Rothman  Subjective/Objective Assessment:   Pt admitted from home with dehydration and hyperglycemia. Pt lives alone and has a Careers information officer and granddaughter who are very active in the care of the pt. Pt is fairly independent with ADL's.     Action/Plan:   Pt will benefit from [facility] at discharge with [facility] PT is recommending ALF but pt states that he is going home. Family aware of decision. Pt will need RN, PT and aide.   Anticipated DC Date:  12/06/2012   Anticipated DC Plan:  HOME W HOME HEALTH SERVICES      DC Planning Services  CM consult      PAC Choice  DURABLE MEDICAL EQUIPMENT  HOME HEALTH   Choice offered to / List presented to:  C-1 Patient   DME arranged  3-N-1  Levan Hurst      DME agency  [facility]     [facility] arranged  HH-1 RN  HH-2 PT  HH-4 NURSE'S AIDE      HH agency  [facility]   Status of service:  Completed, signed off Medicare Important Message given?  YES (If response is "NO", the following Medicare IM given date fields will be blank) Date Medicare IM given:  12/12/2012 Date Additional Medicare IM given:    Discharge Disposition:  HOME W HOME HEALTH SERVICES  Per UR Regulation:    If discussed at Long Length of Stay Meetings, dates discussed:   12/09/2012  12/11/2012    Comments:  12/12/12 1545 Arlyss Queen, RN BSN CMM Pt discharged home today with [facility] and foley. Alroy Bailiff of [facility] is aware and will collect the pts information from the chart. HH services will start within 48 hours of discharge. Bedside RN to educate pt and pts daughter in care of the foley. Pts daughter stated that she will be with pt the day after discharge and after that she and her daughter will check on pt daily. Pt has received BSC and walker  from [facility] and pt will take home with him. Pt and pts nurse aware of discharge arrangements.  12/04/12 1140 Arlyss Queen, RN BSN CM

## 2012-12-04 NOTE — Progress Notes (Signed)
[facility] PROGRESS NOTE  Charles Gibbs ZOX:[id number] DOB: [birth year] DOA: 12/03/2012 PCP: Rudi Heap, MD    Code Status: Full code Family Communication: None present Disposition Plan: To be determined, but likely discharge to home with home health physical therapy.   Consultants:  Urology consultation will be ordered.  Procedures:  None  Antibiotics:  None  HPI/Subjective: The patient was initially seen this morning ambulating with the assistance of the physical therapist this morning. He has no complaints of chest pain or shortness of breath or dizziness. Upon examination this afternoon, he was noted to have a very distended abdomen/bladder. He reports not being able to urinate in 2-3 days.  Objective: Filed Vitals:   12/03/12 1122 12/03/12 1712 12/03/12 2017 12/04/12 1400  BP:  153/85 157/62 148/60  Pulse:  87 82 82  Temp:   98.2 F (36.8 C) 98.6 F (37 C)  TempSrc:   Oral Oral  Resp:  20 20 20   Height: 5\' 6"  (1.676 m)  5\' 6"  (1.676 m)   Weight:   60.7 kg (133 lb 13.1 oz)   SpO2:  99% 99% 98%    Intake/Output Summary (Last 24 hours) at 12/04/12 1555 Last data filed at 12/04/12 1400  Gross per 24 hour  Intake 1678.75 ml  Output   1376 ml  Net 302.75 ml   Filed Weights   12/03/12 2017  Weight: 60.7 kg (133 lb 13.1 oz)    Exam:   General:  Pleasant elderly 79 year old man laying in bed, in no acute distress.  Cardiovascular: S1, S2, with ectopy versus irregular, irregular.  Respiratory: Clear anteriorly.  Abdomen: Positive bowel sounds, markedly distended over the lower abdomen and bladder with moderate discomfort upon palpation.  Musculoskeletal: No acute hot red joints. Hypertrophic arthritic changes noted in his knees and his hands. No pedal edema.  Neurologic: He is alert and oriented x2. Cranial nerves II through XII are grossly intact. Gait noted with the physical therapist to the without ataxia or foot drag.  Data  Reviewed: Basic Metabolic Panel:  Recent Labs Lab 12/03/12 1237 12/04/12 0536  NA 138 143  K 3.9 3.2*  CL 98 109  CO2 19 25  GLUCOSE 423* 49*  BUN 29* 28*  CREATININE 1.18 1.18  CALCIUM 9.2 8.4   Liver Function Tests:  Recent Labs Lab 12/03/12 1237 12/04/12 0536  AST 18 36  ALT 10 10  ALKPHOS 76 60  BILITOT 1.2 0.4  PROT 7.2 6.0  ALBUMIN 3.7 3.1*   No results found for this basename: LIPASE, AMYLASE,  in the last 168 hours No results found for this basename: AMMONIA,  in the last 168 hours CBC:  Recent Labs Lab 12/03/12 1237 12/04/12 0536  WBC 15.9* 16.8*  NEUTROABS 13.7*  --   HGB 15.3 13.8  HCT 42.3 38.4*  MCV 86.7 86.7  PLT 153 160   Cardiac Enzymes:  Recent Labs Lab 12/03/12 1237 12/03/12 1816 12/04/12 0536  CKTOTAL 357*  --  1022*  TROPONINI <0.30 <0.30  --    BNP (last 3 results) No results found for this basename: PROBNP,  in the last 8760 hours CBG:  Recent Labs Lab 12/03/12 2041 12/04/12 0740 12/04/12 0823 12/04/12 0841 12/04/12 1102  GLUCAP 189* 42* 69* 110* 170*    No results found for this or any previous visit (from the past 240 hour(s)).   Studies: Dg Chest 2 View  12/03/2012   *RADIOLOGY REPORT*  Clinical Data: Status post fall.  CHEST -  2 VIEW  Comparison: Acute abdominal series 04/30/2012.  Findings: The heart size and mediastinal contours are stable. There is no evidence of mediastinal hematoma.  The lungs are clear. There is no pleural effusion or pneumothorax. Degenerative changes of the thoracic spine are noted.  No acute osseous findings are seen.  IMPRESSION: No active cardiopulmonary process or post-traumatic findings demonstrated.   Original Report Authenticated By: Carey Bullocks, M.D.   US Renal  12/03/2012   *RADIOLOGY REPORT*  Clinical Data: Hematuria.  Knot urinating.  RENAL/URINARY TRACT ULTRASOUND COMPLETE  Comparison:  None available.  Findings:  Right Kidney:  No hydronephrosis.  Well-preserved cortex.   Normal size and parenchymal echotexture without focal abnormalities. The maximal length is 10.7 cm, within normal limits.  Left Kidney:  The left kidney is atrophic, measuring 5.6 cm maximally.  The kidney is echogenic.  Bladder:  Urinary bladder is distended.  The estimated volume is 755 ml.  No obstructing mass is evident  IMPRESSION:  1.  Atrophic left kidney. 2.  Normal appearance of the right kidney. 3.  Moderately distended urinary bladder.   Original Report Authenticated By: Marin Roberts, M.D.    Scheduled Meds: . aspirin  81 mg Oral q morning - 10a  . atorvastatin  10 mg Oral q morning - 10a  . heparin  5,000 Units Subcutaneous Q8H  . insulin aspart  0-5 Units Subcutaneous QHS  . insulin aspart  0-9 Units Subcutaneous TID WC  . potassium chloride  30 mEq Oral BID   Continuous Infusions: . sodium chloride 125 mL/hr at 12/04/12 0754    Active Problems:   DM (diabetes mellitus), type 2, uncontrolled   Dehydration   Hematuria   Hypoglycemia associated with diabetes   Hypokalemia   Urinary retention   Rhabdomyolysis  1. Uncontrolled type 2 diabetes mellitus. Likely secondary to noncompliance. Insulin restarted. He did have an episode of on symptomatic hypoglycemia earlier. It was treated up accordingly. His blood glucose is now in the 100s to 170. Diabetes coordinator recommendation noted, but would favor starting Levemir at a lower dose. His hemoglobin A1c is 7.6.  Dehydration. We'll continue IV fluid hydration.  Fall at home likely resulting in mild rhabdomyolysis. Etiology of the fall unknown, possibly secondary to disequilibrium in the setting of dehydration and hyperglycemia. No obvious neurological deficit or fractures per exam and per witnessed ambulation with the physical therapist. Continue IV fluid hydration. Vitamin B12 level and TSH within normal limits.  Urinary retention and hematuria. The patient reports urinary retention for at least 2-3 days. Foley catheter  inserted this afternoon and probably yielded 1.3 L of urine. Urinalysis ordered yesterday revealed no evidence of infection. Renal ultrasound reveals no hydronephrosis, but an atrophic left kidney and not surprising moderately distended urinary bladder.  Hypokalemia. Possibly secondary to IV fluids and correction of hyperglycemia. Will supplement.      Plan: 1. Urology consultation. 2. Amaryl discontinued due to to propensity to cause hypoglycemia. Will restart Levemir at small dose. 3. Potassium chloride supplementation. 4. We'll hold statin and recheck CK in the morning. 5. We'll order home health PT when he is ready for discharge. We'll order equipment as recommended.  Time spent: 30 minutes.    [facility]  [facility] Pager [phone]. If 7PM-7AM, please contact night-coverage at [email or web address], password [facility] 12/04/2012, 3:55 PM  LOS: 1 day

## 2012-12-05 DIAGNOSIS — D696 Thrombocytopenia, unspecified: Secondary | ICD-10-CM | POA: Diagnosis not present

## 2012-12-05 DIAGNOSIS — E1169 Type 2 diabetes mellitus with other specified complication: Secondary | ICD-10-CM

## 2012-12-05 LAB — GLUCOSE, CAPILLARY
Glucose-Capillary: 72 mg/dL (ref 70–99)
Glucose-Capillary: 163 mg/dL — ABNORMAL HIGH (ref 70–99)
Glucose-Capillary: 29 mg/dL — CL (ref 70–99)
Glucose-Capillary: 85 mg/dL (ref 70–99)

## 2012-12-05 LAB — CBC
HCT: 36.9 % — ABNORMAL LOW (ref 39.0–52.0)
Platelets: 140 10*3/uL — ABNORMAL LOW (ref 150–400)
RBC: 4.2 MIL/uL — ABNORMAL LOW (ref 4.22–5.81)
RDW: 13.3 % (ref 11.5–15.5)
WBC: 11.4 10*3/uL — ABNORMAL HIGH (ref 4.0–10.5)

## 2012-12-05 LAB — COMPREHENSIVE METABOLIC PANEL
Alkaline Phosphatase: 52 U/L (ref 39–117)
BUN: 24 mg/dL — ABNORMAL HIGH (ref 6–23)
Creatinine, Ser: 1.12 mg/dL (ref 0.50–1.35)
GFR calc Af Amer: 69 mL/min — ABNORMAL LOW (ref >90)
Glucose, Bld: 147 mg/dL — ABNORMAL HIGH (ref 70–99)
Potassium: 3.6 mEq/L (ref 3.5–5.1)
Total Protein: 5.4 g/dL — ABNORMAL LOW (ref 6.0–8.3)

## 2012-12-05 LAB — MAGNESIUM: Magnesium: 2.3 mg/dL (ref 1.5–2.5)

## 2012-12-05 MED ORDER — POTASSIUM CHLORIDE CRYS ER 20 MEQ PO TBCR
20.0000 meq | EXTENDED_RELEASE_TABLET | Freq: Every day | ORAL | Status: DC
  Administered 2012-12-05 – 2012-12-09 (×5): 20 meq via ORAL
  Filled 2012-12-05 (×5): qty 1

## 2012-12-05 MED ORDER — INSULIN DETEMIR 100 UNIT/ML ~~LOC~~ SOLN
5.0000 [IU] | Freq: Every day | SUBCUTANEOUS | Status: DC
  Filled 2012-12-05 (×3): qty 0.05

## 2012-12-05 MED ORDER — HYDRALAZINE HCL 20 MG/ML IJ SOLN
10.0000 mg | Freq: Four times a day (QID) | INTRAMUSCULAR | Status: DC | PRN
  Administered 2012-12-05 – 2012-12-10 (×3): 10 mg via INTRAVENOUS
  Filled 2012-12-05 (×3): qty 1

## 2012-12-05 MED ORDER — DEXTROSE 50 % IV SOLN
INTRAVENOUS | Status: AC
  Administered 2012-12-05: 50 mL
  Filled 2012-12-05: qty 50

## 2012-12-05 NOTE — Progress Notes (Signed)
Physical Therapy Treatment Patient Details Name: Charles Gibbs MRN: [id number] DOB: Nov 05, 1927 Today's Date: 12/05/2012 Time: 4098-1191 PT Time Calculation (min): 29 min  PT Assessment / Plan / Recommendation  PT Comments   Pt was up in a chair at the time of my arrival, no c/o.  His transfer ability, while still labored, is improved with less assistance needed.  Follow Up Recommendations        Does the patient have the potential to tolerate intense rehabilitation     Barriers to Discharge        Equipment Recommendations       Recommendations for Other Services    Frequency     Progress towards PT Goals Progress towards PT goals: Progressing toward goals  Plan Current plan remains appropriate    Precautions / Restrictions     Pertinent Vitals/Pain     Mobility  Bed Mobility Bed Mobility: Not assessed Supine to Sit: 5: Supervision;HOB flat Transfers Sit to Stand: 5: Supervision;With upper extremity assist;From chair/3-in-1 Stand to Sit: 5: Supervision;With upper extremity assist;To bed Details for Transfer Assistance: sit to stand transfer continues to be very labored but pt is successful from standard chair height...he was instruced in anterior weight shift in order to stand Ambulation/Gait Ambulation/Gait Assistance: 5: Supervision Ambulation Distance (Feet): 250 Feet Assistive device: None Stairs: Yes Stairs Assistance: 4: Min guard Stair Management Technique: One rail Right Number of Stairs: 1 Wheelchair Mobility Wheelchair Mobility: No    Exercises General Exercises - Upper Extremity Shoulder Flexion: AROM;Both;5 reps;Seated General Exercises - Lower Extremity Long Arc Quad: AROM;Both;10 reps;Seated Hip ABduction/ADduction: AROM;Strengthening;Both;10 reps;Seated Hip Flexion/Marching: AROM;Both;20 reps;Seated Other Exercises Other Exercises: sit to stand for 5 repetions Other Exercises: forward trunk stretch while seated Other Exercises: cervical  rotation/shoulder rolls   PT Diagnosis:    PT Problem List:   PT Treatment Interventions:     PT Goals (current goals can now be found in the care plan section)    Visit Information  Last PT Received On: 12/05/12    Subjective Data      Cognition       Balance     End of Session PT - End of Session Equipment Utilized During Treatment: Gait belt Activity Tolerance: Patient tolerated treatment well Patient left: in bed;with call bell/phone within reach;with bed alarm set   GP     [name] 12/05/2012, 2:13 PM

## 2012-12-05 NOTE — Progress Notes (Signed)
Orders per Dr. Jerre Simon to DC foley and bladder scan.  Bladder scan reveals 0 cc with foley still in place.  Foley removed - WNL.  Will monitor for patient to void independently

## 2012-12-05 NOTE — Progress Notes (Signed)
[facility] PROGRESS NOTE  Charles Gibbs AVW:[id number] DOB: 08/27/1927 DOA: 12/03/2012 PCP: Rudi Heap, MD    Code Status: Full code Family Communication: None present Disposition Plan: To be determined, but likely discharge to home with home health physical therapy.   Consultants:  Urology consultation pending  Procedures:  None  Antibiotics:  None  HPI/Subjective: The patient says that the lower part of his abdomen feel a lot better. His appetite has been fair. No complaints of nausea, vomiting, or diarrhea. No complaints of headache or dizziness. Noted hypoglycemia this morning, but the patient does not recall any specific side effects. Treated appropriately by nursing.  Objective: Filed Vitals:   12/04/12 1400 12/04/12 2100 12/04/12 2111 12/05/12 0531  BP: 148/60  162/74 150/75  Pulse: 82  81 64  Temp: 98.6 F (37 C)  97.4 F (36.3 C) 98 F (36.7 C)  TempSrc: Oral Oral Oral Oral  Resp: 20  20 20   Height:      Weight:      SpO2: 98%  98% 97%    Intake/Output Summary (Last 24 hours) at 12/05/12 0958 Last data filed at 12/05/12 0800  Gross per 24 hour  Intake 3371.33 ml  Output   1700 ml  Net 1671.33 ml   Filed Weights   12/03/12 2017  Weight: 60.7 kg (133 lb 13.1 oz)    Exam:   General:  Pleasant elderly 79 year old man  in no acute distress.  Cardiovascular: S1, S2, with no murmurs rubs or gallops.  Respiratory: Clear anteriorly.  Abdomen: Positive bowel sounds, significantly less distended compared to yesterday. Minimal suprapubic tenderness.  Musculoskeletal: No acute hot red joints. Hypertrophic arthritic changes noted in his knees and his hands. No pedal edema.  Neurologic: He is alert and oriented x2. Cranial nerves II through XII are grossly intact. Gait noted with the physical therapist on 12/04/2012  without ataxia or foot drag.  Data Reviewed: Basic Metabolic Panel:  Recent Labs Lab 12/03/12 1237 12/04/12 0536  12/05/12 0622  NA 138 143 138  K 3.9 3.2* 3.6  CL 98 109 109  CO2 19 25 22   GLUCOSE 423* 49* 147*  BUN 29* 28* 24*  CREATININE 1.18 1.18 1.12  CALCIUM 9.2 8.4 7.8*  MG  --   --  2.3   Liver Function Tests:  Recent Labs Lab 12/03/12 1237 12/04/12 0536 12/05/12 0622  AST 18 36 29  ALT 10 10 11   ALKPHOS 76 60 52  BILITOT 1.2 0.4 0.4  PROT 7.2 6.0 5.4*  ALBUMIN 3.7 3.1* 2.5*   No results found for this basename: LIPASE, AMYLASE,  in the last 168 hours No results found for this basename: AMMONIA,  in the last 168 hours CBC:  Recent Labs Lab 12/03/12 1237 12/04/12 0536 12/05/12 0622  WBC 15.9* 16.8* 11.4*  NEUTROABS 13.7*  --   --   HGB 15.3 13.8 13.0  HCT 42.3 38.4* 36.9*  MCV 86.7 86.7 87.9  PLT 153 160 140*   Cardiac Enzymes:  Recent Labs Lab 12/03/12 1237 12/03/12 1816 12/04/12 0536 12/05/12 0622  CKTOTAL 357*  --  1022* 266*  TROPONINI <0.30 <0.30  --   --    BNP (last 3 results) No results found for this basename: PROBNP,  in the last 8760 hours CBG:  Recent Labs Lab 12/04/12 1750 12/04/12 2109 12/05/12 0448 12/05/12 0528 12/05/12 0713  GLUCAP 179* 72 29* 163* 132*    No results found for this or any previous visit (  from the past 240 hour(s)).   Studies: Dg Chest 2 View  12/03/2012   *RADIOLOGY REPORT*  Clinical Data: Status post fall.  CHEST - 2 VIEW  Comparison: Acute abdominal series 04/30/2012.  Findings: The heart size and mediastinal contours are stable. There is no evidence of mediastinal hematoma.  The lungs are clear. There is no pleural effusion or pneumothorax. Degenerative changes of the thoracic spine are noted.  No acute osseous findings are seen.  IMPRESSION: No active cardiopulmonary process or post-traumatic findings demonstrated.   Original Report Authenticated By: Carey Bullocks, M.D.   US Renal  12/03/2012   *RADIOLOGY REPORT*  Clinical Data: Hematuria.  Knot urinating.  RENAL/URINARY TRACT ULTRASOUND COMPLETE  Comparison:   None available.  Findings:  Right Kidney:  No hydronephrosis.  Well-preserved cortex.  Normal size and parenchymal echotexture without focal abnormalities. The maximal length is 10.7 cm, within normal limits.  Left Kidney:  The left kidney is atrophic, measuring 5.6 cm maximally.  The kidney is echogenic.  Bladder:  Urinary bladder is distended.  The estimated volume is 755 ml.  No obstructing mass is evident  IMPRESSION:  1.  Atrophic left kidney. 2.  Normal appearance of the right kidney. 3.  Moderately distended urinary bladder.   Original Report Authenticated By: Marin Roberts, M.D.    Scheduled Meds: . aspirin  81 mg Oral q morning - 10a  . heparin  5,000 Units Subcutaneous Q8H  . insulin aspart  0-5 Units Subcutaneous QHS  . insulin aspart  0-9 Units Subcutaneous TID WC  . insulin detemir  10 Units Subcutaneous QHS   Continuous Infusions: . sodium chloride 75 mL/hr at 12/05/12 0431    Active Problems:   DM (diabetes mellitus), type 2, uncontrolled   Dehydration   Hematuria   Hypoglycemia associated with diabetes   Hypokalemia   Urinary retention   Rhabdomyolysis   Thrombocytopenia, unspecified  1. Uncontrolled type 2 diabetes mellitus with early morning episodes of hypoglycemia. Likely secondary to noncompliance. His capillary blood glucose appears to be low in the morning. Will therefore discontinue each bedtime Levemir in favor of Levemir dosing in the morning. Oral hypoglycemic agents are being held. hemoglobin A1c is 7.6.  Dehydration. Resolving with gentle IV fluid hydration.  Fall at home likely resulting in mild rhabdomyolysis. Etiology of the fall unknown, possibly secondary to disequilibrium in the setting of dehydration and hyperglycemia. No obvious neurological deficit or fractures per exam and per witnessed ambulation with the physical therapist. Continue IV fluid hydration. Vitamin B12 level and TSH are within normal limits. Total CK has decreased from  1222-266.  Urinary retention and hematuria. The patient reports urinary retention for at least 2-3 days. Foley catheter inserted on 12/04/2012 and yielded 1.3 L of urine. Urinalysis revealed no evidence of infection but with hematuria. Renal ultrasound on 12/04/2012 revealed no hydronephrosis, but an atrophic left kidney and not surprising moderately distended urinary bladder.  Hypokalemia. Possibly secondary to IV fluids and correction of hyperglycemia. Will supplement/replete.   Mild thrombocytopenia. Platelet count normal on admission. We'll continue to follow.      Plan: 1. Urology consultation pending. 2. Change Levemir to every morning. 3. We'll decrease IV fluids some. 4. Continue supportive treatment. 5. Consider restarting statin, but will wait a couple more days in the setting of recent mild rhabdomyolysis.   Time spent: 30 minutes.    [facility]  [facility] Pager [phone]. If 7PM-7AM, please contact night-coverage at [email or web address], password [facility] 12/05/2012, 9:58 AM  LOS:  2 days

## 2012-12-05 NOTE — Progress Notes (Signed)
Hypoglycemic Event  CBG: 66  Treatment: 15 GM carbohydrate snack  Symptoms: None  Follow-up CBG: Time:1720 CBG Result:72  Possible Reasons for Event: Inadequate meal intake  Comments/MD notified:Patient given snack and now eating dinner    Charles Gibbs, Charles Gibbs  Remember to initiate Hypoglycemia Order Set & complete

## 2012-12-05 NOTE — Progress Notes (Signed)
Inpatient Diabetes Program Recommendations  AACE/ADA: New Consensus Statement on Inpatient Glycemic Control (2013)  Target Ranges:  Prepandial:   less than 140 mg/dL      Peak postprandial:   less than 180 mg/dL (1-2 hours)      Critically ill patients:  140 - 180 mg/dL   Results for KASPER, MUDRICK (MRN [id number]) as of 12/05/2012 08:03  Ref. Range 12/04/2012 08:23 12/04/2012 08:41 12/04/2012 11:02 12/04/2012 16:54 12/04/2012 17:50 12/04/2012 21:09 12/05/2012 04:48 12/05/2012 05:28  Glucose-Capillary Latest Range: 70-99 mg/dL 69 (L) 409 (H) 811 (H) 48 (L) 179 (H) 72 29 (LL) 163 (H)    Inpatient Diabetes Program Recommendations Insulin - Basal: Please discontinue Levemir at this time. Blood glucose noted to be 29 mg/dl this morning at 9:14.  Note: Noted that Levemir was reordered as recommended but at a low dose.  Patient received Levemir 10 units on 7/17 @ 21:32.  Despite low dose, patient experienced hypoglycemic episode this morning at 4:48 with a blood glucose of 29 mg/dl. Patient does not have any intake charted so unsure if patient is eating.  At this time, please continue Levemir and continue on Novolog correction scale in order to prevent further episodes of hypoglycemia.  Will continue to follow.  Thanks, Orlando Penner, RN, MSN, CCRN Diabetes Coordinator Inpatient Diabetes Program [phone]

## 2012-12-06 DIAGNOSIS — I10 Essential (primary) hypertension: Secondary | ICD-10-CM | POA: Diagnosis present

## 2012-12-06 LAB — CBC
HCT: 36.5 % — ABNORMAL LOW (ref 39.0–52.0)
MCH: 30.9 pg (ref 26.0–34.0)
MCHC: 35.6 g/dL (ref 30.0–36.0)
MCV: 86.7 fL (ref 78.0–100.0)
RDW: 13 % (ref 11.5–15.5)

## 2012-12-06 LAB — GLUCOSE, CAPILLARY
Glucose-Capillary: 103 mg/dL — ABNORMAL HIGH (ref 70–99)
Glucose-Capillary: 128 mg/dL — ABNORMAL HIGH (ref 70–99)
Glucose-Capillary: 199 mg/dL — ABNORMAL HIGH (ref 70–99)
Glucose-Capillary: 220 mg/dL — ABNORMAL HIGH (ref 70–99)

## 2012-12-06 MED ORDER — LISINOPRIL 5 MG PO TABS
2.5000 mg | ORAL_TABLET | Freq: Every day | ORAL | Status: DC
  Administered 2012-12-06 – 2012-12-07 (×2): 2.5 mg via ORAL
  Filled 2012-12-06 (×2): qty 1

## 2012-12-06 MED ORDER — KCL IN DEXTROSE-NACL 20-5-0.9 MEQ/L-%-% IV SOLN
INTRAVENOUS | Status: DC
  Administered 2012-12-06 – 2012-12-07 (×3): via INTRAVENOUS

## 2012-12-06 MED ORDER — INSULIN ASPART 100 UNIT/ML ~~LOC~~ SOLN
0.0000 [IU] | Freq: Three times a day (TID) | SUBCUTANEOUS | Status: DC
  Administered 2012-12-06: 1 [IU] via SUBCUTANEOUS
  Administered 2012-12-06 – 2012-12-07 (×2): 2 [IU] via SUBCUTANEOUS
  Administered 2012-12-07: 3 [IU] via SUBCUTANEOUS
  Administered 2012-12-07 – 2012-12-09 (×3): 2 [IU] via SUBCUTANEOUS

## 2012-12-06 NOTE — Progress Notes (Signed)
Attempted to contact the consulting urologist and was unsuccessful will make the day shift nurse aware.

## 2012-12-06 NOTE — Progress Notes (Signed)
[facility] PROGRESS NOTE  Charles Gibbs:[id number] DOB: 05-21-1928 DOA: 12/03/2012 PCP: Rudi Heap, MD    Code Status: Full code Family Communication: None present Disposition Plan: To be determined, but likely discharge to home with home health physical therapy.   Consultants:  Urology   Procedures:  None  Antibiotics:  None  HPI/Subjective: The patient says that the lower part of his abdomen feel a lot better. He denies sweatiness and shakiness with low blood sugars. His appetite has been fair. Foley catheter discontinued per recommendation by Dr. Jerre Simon for voiding trial. Patient was unable to void spontaneously. The nursing staff had been performing in and out catheterizations.  Objective: Filed Vitals:   12/05/12 1448 12/05/12 2100 12/05/12 2300 12/06/12 0300  BP: 190/93 190/90 153/72 157/77  Pulse: 78 84 89 81  Temp: 97.5 F (36.4 C) 98.5 F (36.9 C)  97.5 F (36.4 C)  TempSrc: Oral Oral  Oral  Resp: 20 20  20   Height:      Weight:      SpO2: 99% 98%  97%    Intake/Output Summary (Last 24 hours) at 12/06/12 1159 Last data filed at 12/06/12 0955  Gross per 24 hour  Intake    580 ml  Output   1700 ml  Net  -1120 ml   Filed Weights   12/03/12 2017  Weight: 60.7 kg (133 lb 13.1 oz)    Exam:   General:  Pleasant elderly 79 year old man  in no acute distress.  Cardiovascular: S1, S2, with no murmurs rubs or gallops.  Respiratory: Clear anteriorly.  Abdomen: Positive bowel sounds, significantly less distended. Minimal suprapubic tenderness.  Musculoskeletal: No acute hot red joints. Hypertrophic arthritic changes noted in his knees and his hands. No pedal edema.  Neurologic: He is alert and oriented x2. Cranial nerves II through XII are grossly intact.  Data Reviewed: Basic Metabolic Panel:  Recent Labs Lab 12/03/12 1237 12/04/12 0536 12/05/12 0622  NA 138 143 138  K 3.9 3.2* 3.6  CL 98 109 109  CO2 19 25 22   GLUCOSE 423*  49* 147*  BUN 29* 28* 24*  CREATININE 1.18 1.18 1.12  CALCIUM 9.2 8.4 7.8*  MG  --   --  2.3   Liver Function Tests:  Recent Labs Lab 12/03/12 1237 12/04/12 0536 12/05/12 0622  AST 18 36 29  ALT 10 10 11   ALKPHOS 76 60 52  BILITOT 1.2 0.4 0.4  PROT 7.2 6.0 5.4*  ALBUMIN 3.7 3.1* 2.5*   No results found for this basename: LIPASE, AMYLASE,  in the last 168 hours No results found for this basename: AMMONIA,  in the last 168 hours CBC:  Recent Labs Lab 12/03/12 1237 12/04/12 0536 12/05/12 0622 12/06/12 0552  WBC 15.9* 16.8* 11.4* 8.4  NEUTROABS 13.7*  --   --   --   HGB 15.3 13.8 13.0 13.0  HCT 42.3 38.4* 36.9* 36.5*  MCV 86.7 86.7 87.9 86.7  PLT 153 160 140* 154   Cardiac Enzymes:  Recent Labs Lab 12/03/12 1237 12/03/12 1816 12/04/12 0536 12/05/12 0622  CKTOTAL 357*  --  1022* 266*  TROPONINI <0.30 <0.30  --   --    BNP (last 3 results) No results found for this basename: PROBNP,  in the last 8760 hours CBG:  Recent Labs Lab 12/05/12 1152 12/05/12 1659 12/05/12 1733 12/05/12 2006 12/06/12 0751  GLUCAP 85 66* 72 96 103*    No results found for this or any previous  visit (from the past 240 hour(s)).   Studies: No results found.  Scheduled Meds: . aspirin  81 mg Oral q morning - 10a  . heparin  5,000 Units Subcutaneous Q8H  . insulin aspart  0-5 Units Subcutaneous QHS  . insulin aspart  0-9 Units Subcutaneous TID WC  . lisinopril  2.5 mg Oral Daily  . potassium chloride  20 mEq Oral Daily   Continuous Infusions: . sodium chloride 50 mL/hr at 12/05/12 2222    Active Problems:   DM (diabetes mellitus), type 2, uncontrolled   Dehydration   Hematuria   Hypoglycemia associated with diabetes   Hypokalemia   Urinary retention   Rhabdomyolysis   Thrombocytopenia, unspecified   Unspecified essential hypertension  1. Uncontrolled type 2 diabetes mellitus with early morning episodes of hypoglycemia. His capillary blood glucose appears to be low  in the morning. Levemir was discontinued. Oral hypoglycemic agents are being held. Will discontinue each bedtime NovoLog. Hemoglobin A1c 7.6.  Dehydration. Resolved IV fluid hydration.  Fall at home likely resulting in mild rhabdomyolysis. Etiology of the fall unknown, possibly secondary to disequilibrium in the setting of dehydration and hyperglycemia. No obvious neurological deficit or fractures per exam and per witnessed ambulation with the physical therapist. Continue IV fluid hydration. Vitamin B12 level and TSH are within normal limits. Total CK has decreased from 1222 to 266.  Urinary retention and hematuria. The patient reports urinary retention for at least 2-3 days prior to hospital admission. Foley catheter inserted on 12/04/2012 and yielded 1.3 L of urine. Urinalysis revealed no evidence of infection but with hematuria. Renal ultrasound on 12/04/2012 revealed no hydronephrosis, but an atrophic left kidney and not surprising moderately distended urinary bladder. Dr. Jerre Simon evaluated the patient (awaiting note in the chart). Per nursing staff, the Foley catheter was discontinued for voiding trial. The patient has been unable to void. Will discuss further with Dr. Jerre Simon as the patient will likely need a cystoscopy.  Hypokalemia. Possibly secondary to IV fluids and correction of hyperglycemia. Continue to supplement/replete.   Mild hypertension. Lisinopril was started empirically this morning.  Mild thrombocytopenia. Platelet count normal on admission. Platelet count better.  Hyperlipidemia. Statin on hold secondary to recent rhabdomyolysis.   Plan:  1. Add dextrose to gentle IV fluids. 2. Discontinue NovoLog coverage at night. Levemir has already been discontinued. 3. Reinsert Foley catheter. We'll discuss further with Dr. Jerre Simon. It is likely that the patient needs a cystoscopy.   Time spent: 30 minutes.    [facility]  [facility] Pager [phone]. If 7PM-7AM, please  contact night-coverage at [email or web address], password [facility] 12/06/2012, 11:59 AM  LOS: 3 days

## 2012-12-06 NOTE — Progress Notes (Signed)
Hypoglycemic Event  CBG: 56  Treatment: 15 GM carbohydrate snack  Symptoms: None  Follow-up CBG: Time:0800 CBG Result:103  Possible Reasons for Event: Inadequate Meal Intake  Comments/MD notified:Pt given orange juice, sugar 103 on recheck, encouraged to continue eating breakfast, patient has had poor appetite    Greggory Stallion, Jeanella Anton  Remember to initiate Hypoglycemia Order Set & complete

## 2012-12-06 NOTE — Progress Notes (Signed)
Patients foley cath was removed during the day shift.  Patient still had not voided at 0030, bladder scanned patient and got >371cc, followed cath order and straight cath patient and got 800cc of pink urine.  Bladder scanned patient again and got a post cath scan of 0.  Will continue to monitor the patient and will notify the day nurse.

## 2012-12-06 NOTE — Progress Notes (Signed)
Paged the on call MD with the results of the bladder scan,  Was told to contact consulting urologist.  Attempted to call the on consulting urologist and was unable to reach him at this time will continue to try to make contact, and will pass to the day nurse.

## 2012-12-06 NOTE — Plan of Care (Signed)
Problem: Phase II Progression Outcomes Goal: Discharge plan established Outcome: Completed/Met Date Met:  12/06/12 Plan for HH at DC

## 2012-12-07 LAB — GLUCOSE, CAPILLARY
Glucose-Capillary: 222 mg/dL — ABNORMAL HIGH (ref 70–99)
Glucose-Capillary: 193 mg/dL — ABNORMAL HIGH (ref 70–99)

## 2012-12-07 MED ORDER — MAGNESIUM HYDROXIDE 400 MG/5ML PO SUSP
15.0000 mL | Freq: Once | ORAL | Status: AC
  Administered 2012-12-07: 15 mL via ORAL
  Filled 2012-12-07: qty 30

## 2012-12-07 MED ORDER — SENNOSIDES-DOCUSATE SODIUM 8.6-50 MG PO TABS
1.0000 | ORAL_TABLET | Freq: Every day | ORAL | Status: DC
  Administered 2012-12-07 – 2012-12-11 (×6): 1 via ORAL
  Filled 2012-12-07 (×6): qty 1

## 2012-12-07 MED ORDER — BISACODYL 5 MG PO TBEC
10.0000 mg | DELAYED_RELEASE_TABLET | Freq: Once | ORAL | Status: AC
  Administered 2012-12-07: 10 mg via ORAL
  Filled 2012-12-07: qty 2

## 2012-12-07 MED ORDER — POLYETHYLENE GLYCOL 3350 17 G PO PACK
17.0000 g | PACK | Freq: Every day | ORAL | Status: DC
  Administered 2012-12-07 – 2012-12-12 (×6): 17 g via ORAL
  Filled 2012-12-07 (×6): qty 1

## 2012-12-07 NOTE — Progress Notes (Signed)
[facility] PROGRESS NOTE  Charles Gibbs ZOX:[id number] DOB: 02-11-1928 DOA: 12/03/2012 PCP: Rudi Heap, MD    Code Status: Full code Family Communication: Called the patient's daughter's number but it was a work number and no one answered.  Disposition Plan: To be determined, but likely discharge to home with home health physical therapy.   Consultants:  Urology   Procedures:  None  Antibiotics:  None  Subjective: The patient's only complaint is that he has not had a bowel movement in several days. No complaints of abdominal pain, nausea, or vomiting.  Objective: Filed Vitals:   12/06/12 0300 12/06/12 1422 12/06/12 2132 12/07/12 0541  BP: 157/77 143/70 137/64 169/78  Pulse: 81 80 83 76  Temp: 97.5 F (36.4 C) 98.1 F (36.7 C) 98.4 F (36.9 C) 97.2 F (36.2 C)  TempSrc: Oral  Oral Oral  Resp: 20 18 18 18   Height:      Weight:      SpO2: 97% 98% 99% 96%    Intake/Output Summary (Last 24 hours) at 12/07/12 1445 Last data filed at 12/07/12 0645  Gross per 24 hour  Intake      0 ml  Output    780 ml  Net   -780 ml   Filed Weights   12/03/12 2017  Weight: 60.7 kg (133 lb 13.1 oz)    Exam:   General:  Pleasant elderly 79 year old man  in no acute distress.  Cardiovascular: S1, S2, with no murmurs rubs or gallops.  Respiratory: Clear anteriorly.  Abdomen: Positive bowel sounds, protuberant, soft. Minimal suprapubic tenderness.  GU: Foley catheter inserted. Draining pink-colored urine.  Musculoskeletal: No acute hot red joints. Hypertrophic arthritic changes noted in his knees and his hands. No pedal edema.  Neurologic: He is alert and oriented x2. Cranial nerves II through XII are grossly intact.  Data Reviewed: Basic Metabolic Panel:  Recent Labs Lab 12/03/12 1237 12/04/12 0536 12/05/12 0622  NA 138 143 138  K 3.9 3.2* 3.6  CL 98 109 109  CO2 19 25 22   GLUCOSE 423* 49* 147*  BUN 29* 28* 24*  CREATININE 1.18 1.18 1.12  CALCIUM  9.2 8.4 7.8*  MG  --   --  2.3   Liver Function Tests:  Recent Labs Lab 12/03/12 1237 12/04/12 0536 12/05/12 0622  AST 18 36 29  ALT 10 10 11   ALKPHOS 76 60 52  BILITOT 1.2 0.4 0.4  PROT 7.2 6.0 5.4*  ALBUMIN 3.7 3.1* 2.5*   No results found for this basename: LIPASE, AMYLASE,  in the last 168 hours No results found for this basename: AMMONIA,  in the last 168 hours CBC:  Recent Labs Lab 12/03/12 1237 12/04/12 0536 12/05/12 0622 12/06/12 0552  WBC 15.9* 16.8* 11.4* 8.4  NEUTROABS 13.7*  --   --   --   HGB 15.3 13.8 13.0 13.0  HCT 42.3 38.4* 36.9* 36.5*  MCV 86.7 86.7 87.9 86.7  PLT 153 160 140* 154   Cardiac Enzymes:  Recent Labs Lab 12/03/12 1237 12/03/12 1816 12/04/12 0536 12/05/12 0622  CKTOTAL 357*  --  1022* 266*  TROPONINI <0.30 <0.30  --   --    BNP (last 3 results) No results found for this basename: PROBNP,  in the last 8760 hours CBG:  Recent Labs Lab 12/06/12 0751 12/06/12 1157 12/06/12 1640 12/06/12 2028 12/07/12 0742  GLUCAP 103* 128* 199* 220* 180*    No results found for this or any previous visit (from the  past 240 hour(s)).   Studies: No results found.  Scheduled Meds: . aspirin  81 mg Oral q morning - 10a  . insulin aspart  0-9 Units Subcutaneous TID WC  . lisinopril  2.5 mg Oral Daily  . polyethylene glycol  17 g Oral Daily  . potassium chloride  20 mEq Oral Daily  . senna-docusate  1 tablet Oral QHS   Continuous Infusions: . dextrose 5 % and 0.9 % NaCl with KCl 20 mEq/L 65 mL/hr at 12/07/12 1610    Active Problems:   DM (diabetes mellitus), type 2, uncontrolled   Dehydration   Hematuria   Hypoglycemia associated with diabetes   Hypokalemia   Urinary retention   Rhabdomyolysis   Thrombocytopenia, unspecified   Unspecified essential hypertension  1. Uncontrolled type 2 diabetes mellitus with early morning episodes of hypoglycemia. His capillary blood glucose appears to be low in the morning. Levemir was  discontinued. Oral hypoglycemic agents are being held. Bedtime NovoLog discontinued. Hemoglobin A1c 7.6. Added gentle dextrose with IV fluids. Blood glucose better, but will allow elevation below 200 to avoid symptomatic hypoglycemia.  Dehydration. Resolved IV fluid hydration.  Fall at home likely resulting in mild rhabdomyolysis. Etiology of the fall unknown, possibly secondary to disequilibrium in the setting of dehydration and hyperglycemia. No obvious neurological deficit or fractures per exam and per witnessed ambulation with the physical therapist. Continue IV fluid hydration. Vitamin B12 level and TSH are within normal limits. Total CK has decreased from 1222 to 266.  Urinary retention and hematuria. The patient reports urinary retention for at least 2-3 days prior to hospital admission. Foley catheter inserted on 12/04/2012 and yielded 1.3 L of urine. Urinalysis revealed no evidence of infection but with hematuria. Renal ultrasound on 12/04/2012 revealed no hydronephrosis, but an atrophic left kidney and not surprising moderately distended urinary bladder. Dr. Jerre Simon evaluated the patient (awaiting note in the chart). Per nursing staff, the Foley catheter was discontinued for voiding trial. He failed it. Foley catheter reinserted on 12/06/2012. Will discuss further with Dr. Jerre Simon as the patient will likely need a cystoscopy. Subcutaneous heparin discontinued because of hematuria. SCDs placed for DVT prophylaxis.  Hypokalemia. Possibly secondary to IV fluids and correction of hyperglycemia. Continue to supplement/replete.   Mild hypertension. Lisinopril was started 12/06/2012.  Mild thrombocytopenia. Platelet count normal on admission. Platelet count better.  Hyperlipidemia. Statin on hold secondary to recent rhabdomyolysis.   Plan:  1. Decrease dextrose infusion. 2. Discuss diagnostic workup with urologist Dr. Jerre Simon tomorrow.  3. Otherwise continue current management.   Time spent: 30  minutes.    [facility]  [facility] Pager [phone]. If 7PM-7AM, please contact night-coverage at [email or web address], password [facility] 12/07/2012, 2:45 PM  LOS: 4 days

## 2012-12-07 NOTE — Plan of Care (Signed)
Problem: Phase III Progression Outcomes Goal: Voiding independently Outcome: Not Progressing Patient failed voiding trial - foley reinserted

## 2012-12-08 ENCOUNTER — Encounter (HOSPITAL_COMMUNITY): Payer: Self-pay | Admitting: *Deleted

## 2012-12-08 ENCOUNTER — Encounter (HOSPITAL_COMMUNITY)
Admission: EM | Disposition: A | Discharge status: Home Health | Payer: Self-pay | Source: Home / Self Care | Attending: Internal Medicine

## 2012-12-08 DIAGNOSIS — I10 Essential (primary) hypertension: Secondary | ICD-10-CM

## 2012-12-08 HISTORY — PX: CYSTOSCOPY: SHX5120

## 2012-12-08 LAB — BASIC METABOLIC PANEL
CO2: 23 mEq/L (ref 19–32)
Chloride: 106 mEq/L (ref 96–112)
Creatinine, Ser: 0.93 mg/dL (ref 0.50–1.35)

## 2012-12-08 LAB — CBC
MCV: 87.3 fL (ref 78.0–100.0)
Platelets: 157 10*3/uL (ref 150–400)
RBC: 4 MIL/uL — ABNORMAL LOW (ref 4.22–5.81)
WBC: 6.7 10*3/uL (ref 4.0–10.5)

## 2012-12-08 LAB — GLUCOSE, CAPILLARY
Glucose-Capillary: 161 mg/dL — ABNORMAL HIGH (ref 70–99)
Glucose-Capillary: 161 mg/dL — ABNORMAL HIGH (ref 70–99)

## 2012-12-08 LAB — SURGICAL PCR SCREEN: MRSA, PCR: NEGATIVE

## 2012-12-08 SURGERY — CYSTOSCOPY, FLEXIBLE
Anesthesia: LOCAL | Site: Bladder | Wound class: Clean Contaminated

## 2012-12-08 MED ORDER — SODIUM CHLORIDE 0.9 % IR SOLN
Status: DC | PRN
  Administered 2012-12-08: 3000 mL

## 2012-12-08 MED ORDER — LIDOCAINE HCL 2 % EX GEL
CUTANEOUS | Status: DC | PRN
  Administered 2012-12-08: 1

## 2012-12-08 MED ORDER — LIDOCAINE HCL 2 % EX GEL
CUTANEOUS | Status: AC
  Filled 2012-12-08: qty 10

## 2012-12-08 MED ORDER — LISINOPRIL 5 MG PO TABS
5.0000 mg | ORAL_TABLET | Freq: Every day | ORAL | Status: DC
  Administered 2012-12-08 – 2012-12-09 (×2): 5 mg via ORAL
  Filled 2012-12-08 (×2): qty 1

## 2012-12-08 MED ORDER — POTASSIUM CHLORIDE IN NACL 20-0.9 MEQ/L-% IV SOLN: INTRAVENOUS | Status: DC

## 2012-12-08 SURGICAL SUPPLY — 19 items
CLOTH BEACON ORANGE TIMEOUT ST (SAFETY) ×2 IMPLANT
DRAPE LAPAROTOMY TRNSV 102X78 (DRAPE) ×2 IMPLANT
DRAPE PROXIMA HALF (DRAPES) ×2 IMPLANT
GLOVE BIO SURGEON STRL SZ7 (GLOVE) ×2 IMPLANT
GLOVE BIOGEL PI IND STRL 7.0 (GLOVE) ×1 IMPLANT
GLOVE BIOGEL PI INDICATOR 7.0 (GLOVE) ×1
GLOVE EXAM NITRILE MD LF STRL (GLOVE) ×2 IMPLANT
GLOVE SS BIOGEL STRL SZ 6.5 (GLOVE) ×1 IMPLANT
GLOVE SUPERSENSE BIOGEL SZ 6.5 (GLOVE) ×1
GOWN STRL REIN XL XLG (GOWN DISPOSABLE) ×4 IMPLANT
IV NS IRRIG 3000ML ARTHROMATIC (IV SOLUTION) ×2 IMPLANT
MARKER SKIN DUAL TIP RULER LAB (MISCELLANEOUS) ×2 IMPLANT
SET IRRIG Y TYPE TUR BLADDER L (SET/KITS/TRAYS/PACK) ×2 IMPLANT
SPONGE GAUZE 4X4 12PLY (GAUZE/BANDAGES/DRESSINGS) ×2 IMPLANT
TOWEL OR 17X26 4PK STRL BLUE (TOWEL DISPOSABLE) ×2 IMPLANT
TRAY FOLEY CATH 16FR SILVER (SET/KITS/TRAYS/PACK) ×2 IMPLANT
VALVE DISPOSABLE (MISCELLANEOUS) ×2 IMPLANT
WATER STERILE IRR 1000ML POUR (IV SOLUTION) ×2 IMPLANT
YANKAUER SUCT BULB TIP 10FT TU (MISCELLANEOUS) ×2 IMPLANT

## 2012-12-08 NOTE — Progress Notes (Signed)
[facility] PROGRESS NOTE  Charles Gibbs ZOX:[id number] DOB: 12-06-1927 DOA: 12/03/2012 PCP: Rudi Heap, MD    Code Status: Full code Family Communication: Called the patient's daughter's number but it was a work number and no one answered. The patient cannot remember his cell daughter's number. Disposition Plan: To be determined, but likely discharge to home with home health physical therapy.   Consultants:  Urology   Procedures:  None  Antibiotics:  None  Subjective: He has no new complaints. He had a small bowel movement yesterday. No complaints of abdominal pain, nausea, or vomiting.  Objective: Filed Vitals:   12/07/12 0541 12/07/12 1655 12/07/12 2213 12/08/12 0635  BP: 169/78 176/68 175/83 186/78  Pulse: 76 77 76 73  Temp: 97.2 F (36.2 C) 98 F (36.7 C) 98 F (36.7 C) 98.4 F (36.9 C)  TempSrc: Oral Oral    Resp: 18 20 20 18   Height:      Weight:      SpO2: 96% 98% 98% 97%    Intake/Output Summary (Last 24 hours) at 12/08/12 1200 Last data filed at 12/07/12 1900  Gross per 24 hour  Intake    240 ml  Output    876 ml  Net   -636 ml   Filed Weights   12/03/12 2017  Weight: 60.7 kg (133 lb 13.1 oz)    Exam:   General:  Pleasant elderly 79 year old man  in no acute distress.  Cardiovascular: S1, S2, with no murmurs rubs or gallops.  Respiratory: Clear anteriorly.  Abdomen: Positive bowel sounds, protuberant, soft. Minimal suprapubic tenderness.  GU: Foley catheter inserted. Draining yellow urine.  Musculoskeletal: No acute hot red joints. Hypertrophic arthritic changes noted in his knees and his hands. No pedal edema.  Neurologic: He is alert and oriented x2. Cranial nerves II through XII are grossly intact.  Data Reviewed: Basic Metabolic Panel:  Recent Labs Lab 12/03/12 1237 12/04/12 0536 12/05/12 0622 12/08/12 0551  NA 138 143 138 135  K 3.9 3.2* 3.6 4.3  CL 98 109 109 106  CO2 19 25 22 23   GLUCOSE 423* 49* 147*  180*  BUN 29* 28* 24* 19  CREATININE 1.18 1.18 1.12 0.93  CALCIUM 9.2 8.4 7.8* 8.0*  MG  --   --  2.3  --    Liver Function Tests:  Recent Labs Lab 12/03/12 1237 12/04/12 0536 12/05/12 0622  AST 18 36 29  ALT 10 10 11   ALKPHOS 76 60 52  BILITOT 1.2 0.4 0.4  PROT 7.2 6.0 5.4*  ALBUMIN 3.7 3.1* 2.5*   No results found for this basename: LIPASE, AMYLASE,  in the last 168 hours No results found for this basename: AMMONIA,  in the last 168 hours CBC:  Recent Labs Lab 12/03/12 1237 12/04/12 0536 12/05/12 0622 12/06/12 0552 12/08/12 0551  WBC 15.9* 16.8* 11.4* 8.4 6.7  NEUTROABS 13.7*  --   --   --   --   HGB 15.3 13.8 13.0 13.0 12.4*  HCT 42.3 38.4* 36.9* 36.5* 34.9*  MCV 86.7 86.7 87.9 86.7 87.3  PLT 153 160 140* 154 157   Cardiac Enzymes:  Recent Labs Lab 12/03/12 1237 12/03/12 1816 12/04/12 0536 12/05/12 0622  CKTOTAL 357*  --  1022* 266*  TROPONINI <0.30 <0.30  --   --    BNP (last 3 results) No results found for this basename: PROBNP,  in the last 8760 hours CBG:  Recent Labs Lab 12/07/12 1234 12/07/12 1618 12/07/12 2210 12/08/12  0758 12/08/12 0912  GLUCAP 194* 222* 193* 161* 161*    No results found for this or any previous visit (from the past 240 hour(s)).   Studies: No results found.  Scheduled Meds: . aspirin  81 mg Oral q morning - 10a  . insulin aspart  0-9 Units Subcutaneous TID WC  . lisinopril  5 mg Oral Daily  . polyethylene glycol  17 g Oral Daily  . potassium chloride  20 mEq Oral Daily  . senna-docusate  1 tablet Oral QHS   Continuous Infusions: . 0.9 % NaCl with KCl 20 mEq / L      Active Problems:   DM (diabetes mellitus), type 2, uncontrolled   Dehydration   Hematuria   Hypoglycemia associated with diabetes   Hypokalemia   Urinary retention   Rhabdomyolysis   Thrombocytopenia, unspecified   Unspecified essential hypertension  1. Uncontrolled type 2 diabetes mellitus with early morning episodes of hypoglycemia.  His capillary blood glucose appears to be low in the morning. Levemir was discontinued. Oral hypoglycemic agents are being held. Bedtime NovoLog discontinued. Hemoglobin A1c 7.6. Added gentle dextrose with IV fluids on 12/07/2012. Blood glucose better, but will allow elevation below 200 to avoid symptomatic hypoglycemia.  Dehydration. Resolved IV fluid hydration.  Fall at home likely resulting in mild rhabdomyolysis. Etiology of the fall unknown, possibly secondary to disequilibrium in the setting of dehydration and hyperglycemia. No obvious neurological deficit or fractures per exam and per witnessed ambulation with the physical therapist. Status post IV fluid hydration for several days. Vitamin B12 level and TSH are within normal limits. Total CK has decreased from 1222 to 266.  Urinary retention and hematuria. The patient reports urinary retention for at least 2-3 days prior to hospital admission. Foley catheter inserted on 12/04/2012 and yielded 1.3 L of urine. Urinalysis revealed no evidence of infection but with hematuria. Renal ultrasound on 12/04/2012 revealed no hydronephrosis, but an atrophic left kidney and not surprising moderately distended urinary bladder. Dr. Jerre Simon evaluated the patient (awaiting note in the chart). Per nursing staff, the Foley catheter was discontinued for voiding trial. He failed it. Foley catheter reinserted on 12/06/2012. Will discuss further with Dr. Jerre Simon as the patient will likely need a cystoscopy. Subcutaneous heparin discontinued because of hematuria. SCDs placed for DVT prophylaxis. Hemoglobin decreased slightly from 13-12.4.  Hypokalemia. Possibly secondary to IV fluids and correction of hyperglycemia. Continue to supplement/replete.   Hypertension. Lisinopril was started on 12/06/2012. Will increase the dose of lisinopril. His blood pressure is steadily increasing. I have asked the nursing staff to recheck his blood pressure on both arms and to check it manually  for accuracy.  Mild thrombocytopenia. Platelet count normal on admission. Platelet count better.  Hyperlipidemia. Statin on hold secondary to recent rhabdomyolysis.   Plan:  1. Cystoscopy plan today by Dr. Jerre Simon.   Time spent: 30 minutes.    [facility]  [facility] Pager [phone]. If 7PM-7AM, please contact night-coverage at [email or web address], password [facility] 12/08/2012, 12:00 PM  LOS: 5 days

## 2012-12-08 NOTE — Brief Op Note (Signed)
12/03/2012 - 12/08/2012  1:17 PM  PATIENT:  Charles Gibbs  79 y.o. male  PRE-OPERATIVE DIAGNOSIS:  acute urinary retention  POST-OPERATIVE DIAGNOSIS:  acute urinary retention, bladder stone  PROCEDURE:  Procedure(s): CYSTOSCOPY FLEXIBLE (N/A)  SURGEON:  Surgeon(s) and Role:    * Ky Barban, MD - Primary  PHYSICIAN ASSISTANT:   ASSISTANTS: none   ANESTHESIA:   local  EBL:     BLOOD ADMINISTERED:none  DRAINS: Urinary Catheter (Foley)   LOCAL MEDICATIONS USED:  NONE  SPECIMEN:  No Specimen  DISPOSITION OF SPECIMEN:  N/A  COUNTS:  YES  TOURNIQUET:  * No tourniquets in log *  DICTATION: .Other Dictation: Dictation Number [id number]  PLAN OF CARE: Admit for overnight observation  PATIENT DISPOSITION:  PACU - hemodynamically stable.   Delay start of Pharmacological VTE agent (>24hrs) due to surgical blood loss or risk of bleeding:

## 2012-12-08 NOTE — Progress Notes (Signed)
PT Cancellation Note  Patient Details Name: Charles Gibbs MRN: [id number] DOB: 1928-03-21   Cancelled Treatment:    Reason Eval/Treat Not Completed: Fatigue/lethargy limiting ability to participate Pt is tired from having procedure done and wishes to rest. Will attempt again tomorrow.  Seth Bake, PTA 12/08/2012, 4:45 PM

## 2012-12-09 ENCOUNTER — Encounter (HOSPITAL_COMMUNITY)
Admission: EM | Disposition: A | Discharge status: Home Health | Payer: Self-pay | Source: Home / Self Care | Attending: Internal Medicine

## 2012-12-09 ENCOUNTER — Encounter (HOSPITAL_COMMUNITY): Payer: Self-pay | Admitting: Anesthesiology

## 2012-12-09 ENCOUNTER — Encounter (HOSPITAL_COMMUNITY): Payer: Self-pay | Admitting: Internal Medicine

## 2012-12-09 ENCOUNTER — Inpatient Hospital Stay (HOSPITAL_COMMUNITY): Payer: PRIVATE HEALTH INSURANCE | Admitting: Anesthesiology

## 2012-12-09 DIAGNOSIS — N210 Calculus in bladder: Secondary | ICD-10-CM

## 2012-12-09 HISTORY — PX: CYSTOSCOPY WITH LITHOLAPAXY: SHX1425

## 2012-12-09 HISTORY — DX: Calculus in bladder: N21.0

## 2012-12-09 HISTORY — PX: HOLMIUM LASER APPLICATION: SHX5852

## 2012-12-09 LAB — GLUCOSE, CAPILLARY
Glucose-Capillary: 149 mg/dL — ABNORMAL HIGH (ref 70–99)
Glucose-Capillary: 128 mg/dL — ABNORMAL HIGH (ref 70–99)
Glucose-Capillary: 199 mg/dL — ABNORMAL HIGH (ref 70–99)

## 2012-12-09 SURGERY — CYSTOSCOPY, WITH BLADDER CALCULUS LITHOLAPAXY
Anesthesia: Spinal | Site: Bladder | Wound class: Clean Contaminated

## 2012-12-09 MED ORDER — SODIUM CHLORIDE 0.9 % IR SOLN
Status: DC | PRN
  Administered 2012-12-09 (×4): 3000 mL

## 2012-12-09 MED ORDER — FENTANYL CITRATE 0.05 MG/ML IJ SOLN
INTRAMUSCULAR | Status: DC | PRN
  Administered 2012-12-09: 10 ug via INTRATHECAL

## 2012-12-09 MED ORDER — INSULIN DETEMIR 100 UNIT/ML ~~LOC~~ SOLN
100.0000 [IU] | Freq: Every day | SUBCUTANEOUS | Status: DC
  Administered 2012-12-09: 100 [IU] via SUBCUTANEOUS
  Filled 2012-12-09: qty 1

## 2012-12-09 MED ORDER — SODIUM CHLORIDE 0.45 % IV SOLN
INTRAVENOUS | Status: DC
  Administered 2012-12-09 (×2): via INTRAVENOUS

## 2012-12-09 MED ORDER — STERILE WATER FOR IRRIGATION IR SOLN
Status: DC | PRN
  Administered 2012-12-09: 1000 mL

## 2012-12-09 MED ORDER — 0.9 % SODIUM CHLORIDE (POUR BTL) OPTIME
TOPICAL | Status: DC | PRN
  Administered 2012-12-09: 1000 mL

## 2012-12-09 MED ORDER — PROPOFOL INFUSION 10 MG/ML OPTIME
INTRAVENOUS | Status: DC | PRN
  Administered 2012-12-09: 20 ug/kg/min via INTRAVENOUS

## 2012-12-09 MED ORDER — FENTANYL CITRATE 0.05 MG/ML IJ SOLN: 25.0000 ug | INTRAMUSCULAR | Status: DC | PRN

## 2012-12-09 MED ORDER — BUPIVACAINE IN DEXTROSE 0.75-8.25 % IT SOLN
INTRATHECAL | Status: DC | PRN
  Administered 2012-12-09: 1.8 mL via INTRATHECAL

## 2012-12-09 MED ORDER — ONDANSETRON HCL 4 MG/2ML IJ SOLN: 4.0000 mg | Freq: Once | INTRAMUSCULAR | Status: DC | PRN

## 2012-12-09 MED ORDER — BUPIVACAINE IN DEXTROSE 0.75-8.25 % IT SOLN
INTRATHECAL | Status: AC
  Filled 2012-12-09: qty 2

## 2012-12-09 MED ORDER — LACTATED RINGERS IV SOLN
INTRAVENOUS | Status: DC
  Administered 2012-12-09: 09:00:00 via INTRAVENOUS

## 2012-12-09 MED ORDER — PROPOFOL 10 MG/ML IV EMUL
INTRAVENOUS | Status: AC
  Filled 2012-12-09: qty 20

## 2012-12-09 MED ORDER — MECLIZINE HCL 12.5 MG PO TABS: 25.0000 mg | ORAL_TABLET | Freq: Three times a day (TID) | ORAL | Status: DC | PRN

## 2012-12-09 MED ORDER — FENTANYL CITRATE 0.05 MG/ML IJ SOLN
INTRAMUSCULAR | Status: AC
  Filled 2012-12-09: qty 2

## 2012-12-09 MED ORDER — MIDAZOLAM HCL 2 MG/2ML IJ SOLN
1.0000 mg | INTRAMUSCULAR | Status: DC | PRN
  Administered 2012-12-09: 2 mg via INTRAVENOUS

## 2012-12-09 MED ORDER — MIDAZOLAM HCL 2 MG/2ML IJ SOLN
INTRAMUSCULAR | Status: AC
  Filled 2012-12-09: qty 2

## 2012-12-09 SURGICAL SUPPLY — 21 items
BAG DRAIN URO TABLE W/ADPT NS (DRAPE) ×2 IMPLANT
BAG HAMPER (MISCELLANEOUS) ×2 IMPLANT
CATH OPEN TIP 5FR (CATHETERS) ×2 IMPLANT
CLOTH BEACON ORANGE TIMEOUT ST (SAFETY) ×2 IMPLANT
GLOVE BIO SURGEON STRL SZ7 (GLOVE) ×2 IMPLANT
GLOVE BIOGEL PI IND STRL 7.0 (GLOVE) ×1 IMPLANT
GLOVE BIOGEL PI INDICATOR 7.0 (GLOVE) ×1
GLOVE ECLIPSE 6.5 STRL STRAW (GLOVE) ×2 IMPLANT
GLOVE EXAM NITRILE MD LF STRL (GLOVE) ×2 IMPLANT
GOWN STRL REIN XL XLG (GOWN DISPOSABLE) ×4 IMPLANT
GUIDEWIRE STR PTFE COATED 0.25 (WIRE) ×2 IMPLANT
IV NS IRRIG 3000ML ARTHROMATIC (IV SOLUTION) ×8 IMPLANT
KIT ROOM TURNOVER AP CYSTO (KITS) ×2 IMPLANT
LASER FIBER DISP (UROLOGICAL SUPPLIES) ×2 IMPLANT
LASER FIBER DISP 1000U (UROLOGICAL SUPPLIES) ×2 IMPLANT
MANIFOLD NEPTUNE II (INSTRUMENTS) ×2 IMPLANT
PACK CYSTO (CUSTOM PROCEDURE TRAY) ×2 IMPLANT
PAD ARMBOARD 7.5X6 YLW CONV (MISCELLANEOUS) ×2 IMPLANT
SET IRRIGATING DISP (SET/KITS/TRAYS/PACK) ×2 IMPLANT
TOWEL OR 17X26 4PK STRL BLUE (TOWEL DISPOSABLE) ×2 IMPLANT
WATER STERILE IRR 1000ML POUR (IV SOLUTION) ×2 IMPLANT

## 2012-12-09 NOTE — Op Note (Signed)
Dictated op [id number]

## 2012-12-09 NOTE — Anesthesia Preprocedure Evaluation (Signed)
Anesthesia Evaluation  Patient identified by MRN, date of birth, ID band Patient awake    Reviewed: Allergy & Precautions, H&P , NPO status , Patient's Chart, lab work & pertinent test results  Airway Mallampati: II TM Distance: >3 FB     Dental  (+) Poor Dentition, Loose and Dental Advisory Given   Pulmonary neg pulmonary ROS,  breath sounds clear to auscultation        Cardiovascular hypertension, Rhythm:Regular Rate:Normal     Neuro/Psych  Neuromuscular disease    GI/Hepatic negative GI ROS,   Endo/Other  diabetes, Type 2, Insulin Dependent  Renal/GU      Musculoskeletal   Abdominal   Peds  Hematology   Anesthesia Other Findings   Reproductive/Obstetrics                           Anesthesia Physical Anesthesia Plan  ASA: III  Anesthesia Plan: Spinal   Post-op Pain Management:    Induction:   Airway Management Planned: Nasal Cannula  Additional Equipment:   Intra-op Plan:   Post-operative Plan:   Informed Consent: I have reviewed the patients History and Physical, chart, labs and discussed the procedure including the risks, benefits and alternatives for the proposed anesthesia with the patient or authorized representative who has indicated his/her understanding and acceptance.     Plan Discussed with:   Anesthesia Plan Comments:         Anesthesia Quick Evaluation

## 2012-12-09 NOTE — Progress Notes (Signed)
PT Cancellation Note  Patient Details Name: Charles Gibbs MRN: [id number] DOB: 1928-01-06   Cancelled Treatment:    Reason Eval/Treat Not Completed: Patient at procedure or test/unavailable Pt had Urologic procedure today.  No PT.  Myrlene Broker L 12/09/2012, 1:39 PM

## 2012-12-09 NOTE — Brief Op Note (Signed)
12/03/2012 - 12/09/2012  11:09 AM  PATIENT:  Charles Gibbs  79 y.o. male  PRE-OPERATIVE DIAGNOSIS:  bladder stone  POST-OPERATIVE DIAGNOSIS:  bladder stone  PROCEDURE:  Procedure(s): CYSTOSCOPY WITH LITHOLAPAXY (N/A) HOLMIUM LASER APPLICATION (N/A)  SURGEON:  Surgeon(s) and Role:    * Ky Barban, MD - Primary  PHYSICIAN ASSISTANT:   ASSISTANTS: none   ANESTHESIA:   spinal  EBL:  Total I/O In: 600 [I.V.:600] Out: 2250 [Urine:2250]  BLOOD ADMINISTERED:none  DRAINS: Urinary Catheter (Foley)   LOCAL MEDICATIONS USED:  NONE  SPECIMEN:  No Specimen  DISPOSITION OF SPECIMEN:  PATHOLOGY  COUNTS:  YES  TOURNIQUET:  * No tourniquets in log *  DICTATION: .Other Dictation: Dictation Number [id number]  PLAN OF CARE: Admit for overnight observation  PATIENT DISPOSITION:  PACU - hemodynamically stable.   Delay start of Pharmacological VTE agent (>24hrs) due to surgical blood loss or risk of bleeding:

## 2012-12-09 NOTE — Op Note (Signed)
NAME:  Charles Gibbs, Charles Gibbs NO.:  [id number]  MEDICAL RECORD NO.:  [id number]  LOCATION:  A315                          FACILITY:  [facility]  PHYSICIAN:  Ky Barban, M.D.DATE OF BIRTH:  [birth year]  DATE OF PROCEDURE: DATE OF DISCHARGE:                              OPERATIVE REPORT   PREOPERATIVE DIAGNOSIS:  Acute urinary retention.  POSTOPERATIVE DIAGNOSIS:  Bladder stone.  PROCEDURE:  Cystoscopy.  DESCRIPTION OF PROCEDURE:  The patient placed in supine position.  After usual prep and drape, flexible cystoscope introduced under direct vision.  In the mid penile urethra, there is a laceration in the urethra, it could be stricture, which has been dilated with the catheter.  Now the prostatic urethra is inspected, bladder neck looks wide open.  There is no enlargement of the lateral lobe. Bladder was 2+ trabeculated.  There is a large stone in the bladder, may be the bladder stone was in the prostatic urethra, I do not know, but when I looked at in the bladder that is where I found the stone.  No tumor or stone in the bladder.  Cystoscope was removed.  Foley catheter was reinserted. The patient left the operating room in a satisfactory condition.     Ky Barban, M.D.     MIJ/MEDQ  D:  12/08/2012  T:  12/09/2012  Job:  [id number]

## 2012-12-09 NOTE — Transfer of Care (Signed)
Immediate Anesthesia Transfer of Care Note  Patient: Charles Gibbs  Procedure(s) Performed: Procedure(s): CYSTOSCOPY WITH LITHOLAPAXY (N/A) HOLMIUM LASER APPLICATION (N/A)  Patient Location: PACU  Anesthesia Type:Spinal  Level of Consciousness: sedated  Airway & Oxygen Therapy: Patient Spontanous Breathing  Post-op Assessment: Report given to PACU RN  Post vital signs: Reviewed and stable  Complications: No apparent anesthesia complications

## 2012-12-09 NOTE — Anesthesia Procedure Notes (Signed)
Spinal  Patient location during procedure: OR Start time: 12/09/2012 9:50 AM Staffing CRNA/Resident: Glynn Octave E Preanesthetic Checklist Completed: patient identified, site marked, surgical consent, pre-op evaluation, timeout performed, IV checked, risks and benefits discussed and monitors and equipment checked Spinal Block Patient position: right lateral decubitus Prep: Betadine Patient monitoring: heart rate, cardiac monitor, continuous pulse ox and blood pressure Approach: right paramedian Location: L3-4 Injection technique: single-shot Needle Needle type: Spinocan  Needle gauge: 22 G Needle length: 9 cm Assessment Sensory level: T8 Additional Notes ATTEMPTS:1 TRAY VH:[id number] TRAY EXPIRATION DATE:04/2013

## 2012-12-09 NOTE — Progress Notes (Signed)
Arouses to name. Does not answer questions appropriately about sensation. Will continue to monitor.

## 2012-12-09 NOTE — Anesthesia Postprocedure Evaluation (Signed)
  Anesthesia Post-op Note  Patient: Charles Gibbs  Procedure(s) Performed: Procedure(s): CYSTOSCOPY WITH LITHOLAPAXY (N/A) HOLMIUM LASER APPLICATION (N/A)  Patient Location: PACU  Anesthesia Type:Spinal  Level of Consciousness: awake  Airway and Oxygen Therapy: Patient Spontanous Breathing  Post-op Pain: none  Post-op Assessment: Post-op Vital signs reviewed, Patient's Cardiovascular Status Stable, Respiratory Function Stable, Patent Airway and No signs of Nausea or vomiting  Post-op Vital Signs: Reviewed and stable  Complications: No apparent anesthesia complications

## 2012-12-09 NOTE — Progress Notes (Signed)
No response to verbal stimuli. Temp 95.9 (temporal). Bair paws and warm blankets applied.

## 2012-12-09 NOTE — Progress Notes (Signed)
FSBS 139

## 2012-12-09 NOTE — Progress Notes (Signed)
[facility] PROGRESS NOTE  Charles Gibbs ZOX:[id number] DOB: [birth year] DOA: 12/03/2012 PCP: Rudi Heap, MD    Code Status: Full code Family Communication: Called the patient's daughter's number but it was a work number call center and no one knows her. The patient cannot remember his cell daughter's number. Disposition Plan: To be determined, but likely discharge to home with home health physical therapy.   Consultants:  Urology- Dr. Jerre Simon  Procedures: 12/09/12: CYSTOSCOPY WITH LITHOLAPAXY (N/A)  HOLMIUM LASER APPLICATION (N/A)   Antibiotics:  None  Subjective: He just arrived back in his room from cystoscopy. He is drowsy, but has no complaints.  Objective: Filed Vitals:   12/09/12 1230 12/09/12 1239 12/09/12 1253 12/09/12 1327  BP: 167/82 167/98 172/87 167/76  Pulse: 71 72 74   Temp:  96.7 F (35.9 C) 96.3 F (35.7 C)   TempSrc:   Axillary   Resp: 14 14 14 16   Height:      Weight:      SpO2: 94% 97% 99%     Intake/Output Summary (Last 24 hours) at 12/09/12 1355 Last data filed at 12/09/12 1240  Gross per 24 hour  Intake   1000 ml  Output   3350 ml  Net  -2350 ml   Filed Weights   12/03/12 2017 12/08/12 1236 12/09/12 0811  Weight: 60.7 kg (133 lb 13.1 oz) 60.328 kg (133 lb) 60.328 kg (133 lb)    Exam:   General:  Pleasant elderly 79 year old man  in no acute distress, mildly lethargic. Mildly hypothermic from PACU.  Cardiovascular: S1, S2, with no murmurs rubs or gallops.  Respiratory: Clear anteriorly.  Abdomen: Positive bowel sounds, protuberant, soft. Minimal suprapubic tenderness.  GU: Foley catheter inserted. Draining bloody urine.  Musculoskeletal: No acute hot red joints. Hypertrophic arthritic changes noted in his knees and his hands. No pedal edema.  Neurologic: Mildly lethargic from previous anesthesia. Otherwise he has been alert and oriented previously.  Data Reviewed: Basic Metabolic Panel:  Recent Labs Lab  12/03/12 1237 12/04/12 0536 12/05/12 0622 12/08/12 0551  NA 138 143 138 135  K 3.9 3.2* 3.6 4.3  CL 98 109 109 106  CO2 19 25 22 23   GLUCOSE 423* 49* 147* 180*  BUN 29* 28* 24* 19  CREATININE 1.18 1.18 1.12 0.93  CALCIUM 9.2 8.4 7.8* 8.0*  MG  --   --  2.3  --    Liver Function Tests:  Recent Labs Lab 12/03/12 1237 12/04/12 0536 12/05/12 0622  AST 18 36 29  ALT 10 10 11   ALKPHOS 76 60 52  BILITOT 1.2 0.4 0.4  PROT 7.2 6.0 5.4*  ALBUMIN 3.7 3.1* 2.5*   No results found for this basename: LIPASE, AMYLASE,  in the last 168 hours No results found for this basename: AMMONIA,  in the last 168 hours CBC:  Recent Labs Lab 12/03/12 1237 12/04/12 0536 12/05/12 0622 12/06/12 0552 12/08/12 0551  WBC 15.9* 16.8* 11.4* 8.4 6.7  NEUTROABS 13.7*  --   --   --   --   HGB 15.3 13.8 13.0 13.0 12.4*  HCT 42.3 38.4* 36.9* 36.5* 34.9*  MCV 86.7 86.7 87.9 86.7 87.3  PLT 153 160 140* 154 157   Cardiac Enzymes:  Recent Labs Lab 12/03/12 1237 12/03/12 1816 12/04/12 0536 12/05/12 0622  CKTOTAL 357*  --  1022* 266*  TROPONINI <0.30 <0.30  --   --    BNP (last 3 results) No results found for this basename: PROBNP,  in the last 8760 hours CBG:  Recent Labs Lab 12/08/12 0912 12/08/12 1208 12/08/12 1714 12/08/12 2107 12/09/12 0810  GLUCAP 161* 169* 190* 218* 149*    Recent Results (from the past 240 hour(s))  SURGICAL PCR SCREEN     Status: None   Collection Time    12/08/12  1:02 PM      Result Value Range Status   MRSA, PCR NEGATIVE  NEGATIVE Final   Staphylococcus aureus NEGATIVE  NEGATIVE Final   Comment:            The Xpert SA Assay (FDA     approved for NASAL specimens     in patients over 42 years of age),     is one component of     a comprehensive surveillance     program.  Test performance has     been validated by [employer] for patients greater     than or equal to 52 year old.     It is not intended     to diagnose infection nor to      guide or monitor treatment.     Studies: No results found.  Scheduled Meds: . aspirin  81 mg Oral q morning - 10a  . insulin aspart  0-9 Units Subcutaneous TID WC  . insulin detemir  100 Units Subcutaneous QHS  . lisinopril  5 mg Oral Daily  . polyethylene glycol  17 g Oral Daily  . potassium chloride  20 mEq Oral Daily  . senna-docusate  1 tablet Oral QHS   Continuous Infusions: . sodium chloride 100 mL/hr at 12/09/12 1306  . 0.9 % NaCl with KCl 20 mEq / L        Brief history: The patient is an 79 year old who lives alone. Has a history of diabetes and vertigo. He was admitted to the hospital on 12/03/2012 after falling at home. He did not believe he lost consciousness. UA revealed hematuria. On admission, his blood glucose was 423. Treatment started with sliding scale NovoLog and Levemir. He did have a couple of episodes of asymptomatic hypoglycemia. Insulin adjusted. His total CK was elevated at 357, but increased to 1022. Improved after hydration. His troponin I was negative. His chest x-ray revealed no acute disease. Upon my examination the next day, the patient had a very distended abdomen, primarily over the suprapubic area. He reported not being able to urinate for several days. Foley catheter was inserted and drained 1300 cc of urine. Urology was consulted. Renal ultrasound revealed atrophic left kidney and normal appearance of right kidney with moderately distended bladder. Voiding trial was failed. Dr. Jerre Simon performed a cystoscopy and found a bladder stone which was likely causing the urinary retention. Litholaplaxy performed 12/09/2012. The patient will be monitored and reassessed by Dr. Jerre Simon for a voiding trial tomorrow. If he fails that, the patient may need TURP.  Assessment:  Principal Problem:   Fall at home Active Problems:   DM (diabetes mellitus), type 2, uncontrolled   Hematuria   Bladder stone   Dehydration   Hypoglycemia associated with diabetes    Hypokalemia   Urinary retention   Rhabdomyolysis   Thrombocytopenia, unspecified   Unspecified essential hypertension  1. Uncontrolled type 2 diabetes mellitus with early morning episodes of hypoglycemia. His capillary blood glucose appears to be low in the morning. Levemir was discontinued. Oral hypoglycemic agents are being held. Bedtime NovoLog discontinued. He is now on sliding scale NovoLog q. a.c.  Hemoglobin A1c 7.6. Status post gentle dextrose with IV fluids on 12/07/2012. Blood glucose better, but will allow elevation below 200 to avoid symptomatic hypoglycemia.  Dehydration. Resolved IV fluid hydration.  Fall at home likely resulting in mild rhabdomyolysis. Etiology of the fall unknown, possibly secondary to disequilibrium in the setting of dehydration and hyperglycemia. No obvious neurological deficit or fractures per exam and per witnessed ambulation with the physical therapist. Status post IV fluid hydration for several days. Vitamin B12 level and TSH are within normal limits. Total CK has decreased from 1222 to 266.  Urinary retention and hematuria secondary to bladder stone. The patient reports urinary retention for at least 2-3 days prior to hospital admission. Foley catheter inserted on 12/04/2012 and yielded 1.3 L of urine. Urinalysis revealed no evidence of infection but with hematuria. Renal ultrasound on 12/04/2012 revealed no hydronephrosis, but an atrophic left kidney and not surprising moderately distended urinary bladder. Dr. Jerre Simon evaluated the patient.  Foley catheter was discontinued for voiding trial. He failed it. Foley catheter reinserted on 12/06/2012. Dr. Jerre Simon performed a cystoscopy and litholaplexy. Patient with hematuria with Foley catheter still in place. Per Dr. Jerre Simon, a voiding trial might be started tomorrow. If he fails the voiding trial, the patient may need a TURP.  Subcutaneous heparin discontinued because of hematuria. SCDs placed for DVT prophylaxis. Hemoglobin  decreased slightly from 13-12.4.  Hypokalemia. Possibly secondary to IV fluids and correction of hyperglycemia. Continue to supplement/replete.   Hypertension. Lisinopril was started on 12/06/2012. His blood pressure is steadily increasing. I have asked the nursing staff to recheck his blood pressure on both arms and to check it manually for accuracy.  Mild thrombocytopenia. Platelet count normal on admission. Platelet count better.  Hyperlipidemia. Statin on hold secondary to recent rhabdomyolysis.  Mild deconditioning. Physical therapist is recommending home health physical therapy as the patient would likely refuse skilled nursing facility placement. Rolling walker and bedside commode have been ordered.   Plan:  1. Continue supportive treatment. 2. Voiding trial per Dr. Jerre Simon. 3. Monitor electrolytes on half-normal saline ordered postop by Dr. Jerre Simon. 4. I tried to contact the patient's daughter on a number of occasions, but the patient does not recall her phone number. 5. Likely discharge to home in 2 days or longer if TURP is needed and performed.   Time spent: 30 minutes.    [facility]  [facility] Pager [phone]. If 7PM-7AM, please contact night-coverage at [email or web address], password [facility] 12/09/2012, 1:55 PM  LOS: 6 days

## 2012-12-09 NOTE — Op Note (Signed)
NAME:  Charles Gibbs, NONG NO.:  [id number]  MEDICAL RECORD NO.:  [id number]  LOCATION:  A315                          FACILITY:  [facility]  PHYSICIAN:  Ky Barban, M.D.DATE OF BIRTH:  26-Nov-1927  DATE OF PROCEDURE: DATE OF DISCHARGE:                              OPERATIVE REPORT   PREOPERATIVE DIAGNOSIS:  BLADDER STONE.  POSTOPERATIVE DIAGNOSIS:  BLADDER STONE.  PROCEDURE:  LITHOLAPAXY.  SURGEON:  Ky Barban, M.D.  ANESTHESIA:  SPINAL.  PROCEDURE:  The patient under spinal anesthesia in lithotomy position. After usual prep and drape, I introduced #25 cystoscope into the bladder with some difficulties.  So, I had to introduce guidewire with the help of sounds which went into the bladder without difficulty, and there was a median bar but there was no really bladder neck obstruction.  The stone was visualized.  Then, with the help of 1000 micron fiber, stone was broken into pieces and with the help of Ellik evacuator, I got all the pieces out and the bladder was inspected at the end, there is no stone or any piece of the stone in the bladder.  I inspected with 4 oblique right angle lens.  Prostatic urethra looks open except the median bar was there.  Lot of loose tissue was there in the prostatic urethra which came out at the end, and I am also going to send that for pathological examination.  After removing the stones, I removed the cystoscope and #22 Foley catheter with the help of a catheter guide was introduced into the bladder.  It is draining clear urine.  The patient left the operating room in satisfactory condition.     Ky Barban, M.D.     MIJ/MEDQ  D:  12/09/2012  T:  12/09/2012  Job:  [id number]

## 2012-12-10 ENCOUNTER — Encounter (HOSPITAL_COMMUNITY): Payer: Self-pay | Admitting: Urology

## 2012-12-10 DIAGNOSIS — E876 Hypokalemia: Secondary | ICD-10-CM

## 2012-12-10 DIAGNOSIS — N210 Calculus in bladder: Secondary | ICD-10-CM

## 2012-12-10 LAB — GLUCOSE, CAPILLARY
Glucose-Capillary: 13 mg/dL — CL (ref 70–99)
Glucose-Capillary: 197 mg/dL — ABNORMAL HIGH (ref 70–99)
Glucose-Capillary: 99 mg/dL (ref 70–99)
Glucose-Capillary: 94 mg/dL (ref 70–99)
Glucose-Capillary: 98 mg/dL (ref 70–99)
Glucose-Capillary: 89 mg/dL (ref 70–99)
Glucose-Capillary: 64 mg/dL — ABNORMAL LOW (ref 70–99)
Glucose-Capillary: 62 mg/dL — ABNORMAL LOW (ref 70–99)
Glucose-Capillary: 71 mg/dL (ref 70–99)
Glucose-Capillary: 70 mg/dL (ref 70–99)
Glucose-Capillary: 79 mg/dL (ref 70–99)
Glucose-Capillary: 75 mg/dL (ref 70–99)

## 2012-12-10 LAB — CBC
Hemoglobin: 13.7 g/dL (ref 13.0–17.0)
MCH: 31.1 pg (ref 26.0–34.0)
Platelets: 197 10*3/uL (ref 150–400)
RBC: 4.4 MIL/uL (ref 4.22–5.81)
WBC: 13.9 10*3/uL — ABNORMAL HIGH (ref 4.0–10.5)

## 2012-12-10 LAB — BASIC METABOLIC PANEL
CO2: 25 mEq/L (ref 19–32)
Calcium: 8.7 mg/dL (ref 8.4–10.5)
Chloride: 100 mEq/L (ref 96–112)
Glucose, Bld: <20 mg/dL — CL (ref 70–99)
Sodium: 135 mEq/L (ref 135–145)

## 2012-12-10 MED ORDER — DEXTROSE 50 % IV SOLN
INTRAVENOUS | Status: AC
  Filled 2012-12-10: qty 50

## 2012-12-10 MED ORDER — DEXTROSE 50 % IV SOLN
INTRAVENOUS | Status: AC
  Administered 2012-12-10: 07:00:00
  Filled 2012-12-10: qty 50

## 2012-12-10 MED ORDER — DEXTROSE 50 % IV SOLN
50.0000 mL | Freq: Once | INTRAVENOUS | Status: AC | PRN
  Administered 2012-12-10: 50 mL via INTRAVENOUS

## 2012-12-10 MED ORDER — LISINOPRIL 10 MG PO TABS
10.0000 mg | ORAL_TABLET | Freq: Every day | ORAL | Status: DC
  Administered 2012-12-10 – 2012-12-12 (×3): 10 mg via ORAL
  Filled 2012-12-10 (×3): qty 1

## 2012-12-10 MED ORDER — DEXTROSE-NACL 5-0.45 % IV SOLN
INTRAVENOUS | Status: DC
  Administered 2012-12-10 (×2): via INTRAVENOUS

## 2012-12-10 MED ORDER — POTASSIUM CHLORIDE 10 MEQ/100ML IV SOLN
10.0000 meq | INTRAVENOUS | Status: AC
  Administered 2012-12-10 (×4): 10 meq via INTRAVENOUS
  Filled 2012-12-10 (×3): qty 100
  Filled 2012-12-10: qty 200

## 2012-12-10 NOTE — Progress Notes (Signed)
Hypoglycemic Event  CBG: 18 (on BMET) 13 (Fingerstick)  Treatment: D50 IV 50 mL  Symptoms: Pale and Sweaty  Follow-up CBG: Time: 0722 CBG Result: 197 Possible Reasons for Event: Medication regimen: newly started on levemir  Comments/MD notified:paged dr Elisha Headland, Joyice Faster  Remember to initiate Hypoglycemia Order Set & complete

## 2012-12-10 NOTE — Anesthesia Postprocedure Evaluation (Addendum)
  Anesthesia Post-op Note  Patient: Charles Gibbs  Procedure(s) Performed: Procedure(s): CYSTOSCOPY WITH LITHOLAPAXY (N/A) HOLMIUM LASER APPLICATION (N/A)  Patient Location: 315  Anesthesia Type:Spinal  Level of Consciousness: patient cooperative sleepy  Airway and Oxygen Therapy: Patient Spontanous Breathing  Post-op Pain: none  Post-op Assessment: Post-op Vital signs reviewed, Patient's Cardiovascular Status Stable, Respiratory Function Stable and Patent Airway  Post-op Vital Signs: Reviewed and stable  Complications: No apparent anesthesia complications Pt. With Very low Blood glucose this am. Pt. Sleepy at present, arouses easily and denies conplaints.

## 2012-12-10 NOTE — Progress Notes (Signed)
[facility] PROGRESS NOTE  Charles Gibbs UDJ:[id number] DOB: Aug 30, 1927 DOA: 12/03/2012 PCP: Rudi Heap, MD  Assessment/Plan: 1. Profound symptomatic Hypoglycemia: With associated acute encephalopathy. Secondary to high-dose Levemir to started last night. Previously patient not on long-acting insulin or oral anti-hyperglycemics. Exam nonfocal although somewhat limited, no evidence to suggest stroke at this time. Monitor closely, follow capillary blood sugars. Discontinue insulin. 2. DM type 2 uncontrolled with hypoglycemia in AM: Recurrent hypoglycemia has been demonstrated this admission and all long-acting insulin and oral anti-hyperglycemics were discontinued.HgbA1c 7.6. Allow elevation below 200 to avoid symptomatic hypoglycemia. Noncompliant with diet.  3. Urinary retention and hematuria secondary to bladder stone: Urinary retention was noted and 1.3 L was evacuated from the bladder initially. No sign of infection. Urology following patient. Status post removal of bladder stone. Monitor hematuria. Voiding trial. May need to consider TURP. 2 likely restart subcutaneous heparin in the next couple of days. 4. Dehydration: resolved with IVF. 5. Fall at home, mild rhabdomyolysis: cause of fall unknown, possibly dehydration; hypoglycemia should also be considered as well as hyperglycemia. Vitamin B12 level and TSH within normal limits. Continue physical therapy. 6. Hypoxia: Not tachypneic. Appears nontoxic. Wean oxygen as tolerated. No signs or symptoms of aspiration at this point but monitor closely. 7. Hypokalemia: Replete. 8. Hypertension: Continue to monitor. Continue lisinopril. 9. Hyperlipidemia:  restart statin on discharge.  10. Mild deconditioning. Physical therapist is recommending home health physical therapy as the patient would likely refuse skilled nursing facility placement. Rolling walker and bedside commode have been ordered.    Discontinue long-acting insulin. No sliding  scale or oral anti-hyperglycemics.  D50 as needed. D5 half-normal saline infusion. Serial blood sugar checks until stable.  Serial neurologic checks.  Further recommendations per neurology.  Monitor hypoxia.  Replace potassium.  Increase lisinopril to  Code Status: full code DVT prophylaxis: SCDs  Family Communication: None present. Review of chart indicates family has not been reachable.  Disposition Plan: patient refused ALF. HH RN, PT, Aide  Brendia Sacks, MD  [facility]  Pager [phone] If 7PM-7AM, please contact night-coverage at [email or web address], password [facility] 12/10/2012, 8:03 AM  LOS: 7 days   Clinical Summary: 79 year old man presented to ED after a fall (unclear etiology) and prolonged inability to get up off floor at home. Noted to be disheveled. Admitted for dehydration, hyperglycemia without ketosis, hematuria without evidence of UTI. Further evaluation was notable for bladder distension, urology was consulted and patient underwent cystoscopy with bladder stone removal. hospitalization complicated by recurrent hypoglycemia and all medications were stopped.  Consultants:  Urology   PT--home health.  Procedures:  7/22 cystoscopy, Bladder stone removal   HPI/Subjective: Patient currently unable to give history. Discussed with RN, uneventful night, last seen normal about 3 AM when he was able to get up and appeared alert and oriented. This morning BMP resulted with severe hypoglycemia, patient found to be lethargic and treated with D50. Subsequently aroused and follows commands. Patient does not seem to have any pain.  Objective: Filed Vitals:   12/09/12 1651 12/09/12 2153 12/10/12 0538 12/10/12 0729  BP: 177/76 176/72 172/71 180/89  Pulse: 98 98 70 118  Temp: 97.8 F (36.6 C) 98.5 F (36.9 C) 97.9 F (36.6 C)   TempSrc: Oral Oral Oral   Resp: 20 20 20    Height:      Weight:      SpO2: 97% 96% 95% 83%    Intake/Output Summary (Last 24 hours) at  12/10/12 0803 Last data filed at 12/10/12  0600  Gross per 24 hour  Intake   3440 ml  Output   1150 ml  Net   2290 ml     Filed Weights   12/03/12 2017 12/08/12 1236 12/09/12 0811  Weight: 60.7 kg (133 lb 13.1 oz) 60.328 kg (133 lb) 60.328 kg (133 lb)    Exam:   Afebrile, hypertensive, mild tachycardia. Mild hypoxia.  General: Appears calm and comfortable. Nontoxic.  Psychiatric: Somewhat odd affect. Cannot assess mood. Appears confused. Oriented to name.  Muscle skeletal: Follows commands. Clinically. Moves upper and lower extremities symmetrically, strength appears grossly intact all extremities.  Neurologic: Appears grossly unremarkable. Cranial nerves appear grossly intact. Patient does not fully comply with examination.  Cardiovascular: Tachycardic, regular rhythm. No murmur, rub, gallop. No lower extremity edema.  Respiratory: Clear to auscultation bilaterally. No wheezes, rales, rhonchi. Normal respiratory effort.  Abdomen: Soft, nontender, nondistended.  Skin: Bruising to arms seen bilaterally.  Data Reviewed:  Excellent urine output  Blood sugar this morning 13. Previous blood sugars well controlled with negligible sliding-scale coverage.  Potassium 3.1. Remainder basic metabolic panel unremarkable except glucose as above.  CBC notable for white blood cell count 13.9  Troponins were negative. CK peaked 1022. TSH normal.  Chest x-ray negative. Renal ultrasound: Atrophic left kidney. Moderately distended urinary bladder.  Pending studies:   None  Scheduled Meds: . aspirin  81 mg Oral q morning - 10a  . insulin aspart  0-9 Units Subcutaneous TID WC  . lisinopril  5 mg Oral Daily  . polyethylene glycol  17 g Oral Daily  . potassium chloride  20 mEq Oral Daily  . senna-docusate  1 tablet Oral QHS   Continuous Infusions: . 0.9 % NaCl with KCl 20 mEq / L    . dextrose 5 % and 0.45% NaCl 75 mL/hr at 12/10/12 0750    Principal Problem:   Hypoglycemia  associated with diabetes Active Problems:   DM (diabetes mellitus), type 2, uncontrolled   Dehydration   Fall at home   Hematuria   Hypokalemia   Urinary retention   Rhabdomyolysis   Thrombocytopenia, unspecified   Unspecified essential hypertension   Bladder stone   Time spent 35  minutes

## 2012-12-10 NOTE — Progress Notes (Signed)
CBG 56 mg/dl at 1610. Pt alert, not symptomatic. 4 oz apple juice and supper given to patient.

## 2012-12-10 NOTE — Progress Notes (Signed)
Afebrile urine is clear. Will wait to dc foley till fully stable if still unable to void will consider turp. Please let me know.

## 2012-12-10 NOTE — Progress Notes (Signed)
Pt transferred to stepdown unit, ICU 7, per Dr. Irene Limbo. Pt's IV site patent, fluids infusing.  Pt lethargic but arouses to speech.  Dr. Irene Limbo updated family at bedside.  Report called to [place], RN.  Verbalized understanding. Pt left floor in stable condition via bed, accompanied by NT.

## 2012-12-11 LAB — GLUCOSE, CAPILLARY
Glucose-Capillary: 131 mg/dL — ABNORMAL HIGH (ref 70–99)
Glucose-Capillary: 136 mg/dL — ABNORMAL HIGH (ref 70–99)
Glucose-Capillary: 137 mg/dL — ABNORMAL HIGH (ref 70–99)
Glucose-Capillary: 154 mg/dL — ABNORMAL HIGH (ref 70–99)
Glucose-Capillary: 176 mg/dL — ABNORMAL HIGH (ref 70–99)
Glucose-Capillary: 209 mg/dL — ABNORMAL HIGH (ref 70–99)
Glucose-Capillary: 71 mg/dL (ref 70–99)

## 2012-12-11 MED ORDER — GLUCERNA SHAKE PO LIQD
237.0000 mL | Freq: Three times a day (TID) | ORAL | Status: DC
  Administered 2012-12-11 – 2012-12-12 (×5): 237 mL via ORAL

## 2012-12-11 NOTE — Progress Notes (Signed)
UR chart review completed.  

## 2012-12-11 NOTE — Progress Notes (Signed)
[facility] PROGRESS NOTE  Charles Gibbs ZOX:[id number] DOB: 1928-01-14 DOA: 12/03/2012 PCP: Rudi Heap, MD  Assessment/Plan: 1. Profound symptomatic Hypoglycemia: Now resolved. Was associated with acute encephalopathy. Secondary to high-dose Levemir to started 7/22. Previously patient not on long-acting insulin or oral anti-hyperglycemics. No focal deficits. 2. DM type 2 uncontrolled with hypoglycemia in AM: Recurrent hypoglycemia has been demonstrated this admission and all long-acting insulin and oral anti-hyperglycemics were discontinued.HgbA1c 7.6. Allow elevation below 200 to avoid symptomatic hypoglycemia. Noncompliant with diet as outpatient.  3. Urinary retention and hematuria secondary to bladder stone: Urinary retention was noted and 1.3 L was evacuated from the bladder initially. No sign of infection. Urology following patient. Status post removal of bladder stone. Monitor hematuria. Voiding trial. May need to consider TURP. Can likely restart subcutaneous heparin in the next couple of days. 4. Dehydration: resolved with IVF. 5. Fall at home, mild rhabdomyolysis: cause of fall unknown, possibly dehydration; hypoglycemia should also be considered as well as hyperglycemia. Vitamin B12 level and TSH within normal limits. Continue physical therapy. 6. Hypoxia: Not tachypneic. Appears nontoxic. Wean oxygen as tolerated. No signs or symptoms of aspiration. 7. Hypokalemia: Check basic metabolic to morning 8. Hypertension: Much better control. Continue current therapy. 9. Hyperlipidemia: restart statin on discharge.  10. Mild deconditioning. Physical therapist recommends home health physical therapy as the patient would likely refuse skilled nursing facility placement. Rolling walker and bedside commode have been ordered.    Stop dextrose infusion, continue to check blood sugars closely, every 2 hours  Wean oxygen  Continue physical therapy  Check CBC and basic metabolic panel  in the morning  Okay to remove Foley and proceed with voiding trial  Disposition pending urology recommendations and CBG stability  Code Status: full code DVT prophylaxis: SCDs  Family Communication: Updated family at bedside yesterday. Disposition Plan: patient refused ALF. HH RN, PT, Aide  Brendia Sacks, MD  [facility]  Pager [phone] If 7PM-7AM, please contact night-coverage at [email or web address], password [facility] 12/11/2012, 8:17 AM  LOS: 8 days   Clinical Summary: 79 year old man presented to ED after a fall (unclear etiology) and prolonged inability to get up off floor at home. Noted to be disheveled. Admitted for dehydration, hyperglycemia without ketosis, hematuria without evidence of UTI. Further evaluation was notable for bladder distension, urology was consulted and patient underwent cystoscopy with bladder stone removal. hospitalization complicated by recurrent hypoglycemia and all medications were stopped.  Consultants:  Urology   PT--home health.  Procedures:  7/22 cystoscopy, Bladder stone removal   HPI/Subjective: One episode of hypoglycemia last night, asymptomatic. No other issues, starting deep better. More alert. Discussed with RN--no new issues. Patient denies abdominal pain, nausea, vomiting. Feels okay.  Objective: Filed Vitals:   12/11/12 0400 12/11/12 0500 12/11/12 0600 12/11/12 0808  BP: 133/57 128/55 132/57   Pulse: 83 88 84   Temp: 98.1 F (36.7 C)   99.1 F (37.3 C)  TempSrc: Axillary   Axillary  Resp: 14 17 22    Height:      Weight:  71.6 kg (157 lb 13.6 oz)    SpO2: 100% 99% 100%     Intake/Output Summary (Last 24 hours) at 12/11/12 0817 Last data filed at 12/11/12 0600  Gross per 24 hour  Intake 2122.5 ml  Output   2600 ml  Net -477.5 ml     Filed Weights   12/09/12 0811 12/10/12 1143 12/11/12 0500  Weight: 60.328 kg (133 lb) 70 kg (154 lb 5.2 oz) 71.6 kg (  157 lb 13.6 oz)    Exam:   Afebrile, vital signs stable.  Hypertension has resolved.  General: Appears calm and comfortable. Fully alert.  Eyes: Pupils, irises, lids appear grossly normal.  Cardiovascular: Regular rate and rhythm. No murmur, rub, gallop. No lower extremity edema.  Telemetry: Sinus rhythm, no arrhythmias.  Respiratory: Clear to auscultation bilaterally. No wheezes, rales, rhonchi. Normal respiratory effort.  Abdomen: Soft, nontender, nondistended.  Musculoskeletal: Moves all extremities to command, strength and tone grossly normal and symmetric.  Psychiatric: Grossly normal mood and affect. Speech fluent and appropriate. Alert, oriented to self, [name], year, not month (August)  Neurologic: Nonfocal exam.  Data Reviewed:  Excellent urine output continues  Capillary blood sugars generally above 70, last episode of hypoglycemia 1856 yesterday.  Pending studies:   None  Scheduled Meds: . aspirin  81 mg Oral q morning - 10a  . lisinopril  10 mg Oral Daily  . polyethylene glycol  17 g Oral Daily  . senna-docusate  1 tablet Oral QHS   Continuous Infusions:    Principal Problem:   Hypoglycemia associated with diabetes Active Problems:   DM (diabetes mellitus), type 2, uncontrolled   Dehydration   Fall at home   Hematuria   Hypokalemia   Urinary retention   Rhabdomyolysis   Thrombocytopenia, unspecified   Unspecified essential hypertension   Bladder stone   Time spent 20 minutes

## 2012-12-11 NOTE — Progress Notes (Signed)
Patient still unable to void and no urgency per patient. Dr. Irene Limbo notified. Orders given to wait a few more hours and then straight cath PRN per written order. Will continue to monitor.

## 2012-12-11 NOTE — Progress Notes (Signed)
INITIAL NUTRITION ASSESSMENT  DOCUMENTATION CODES Per approved criteria  -Not Applicable   INTERVENTION: Glucerna Shake po TID, each supplement provides 220 kcal and 10 grams of protein.  NUTRITION DIAGNOSIS: Inadequate oral intake related to decreased appetite as evidenced by dehydration at admission, fall, mild-moderate inflammatory response given pt diabetes hx  Goal: Pt to meet >/= 90% of their estimated nutrition needs   Monitor:  Po intake, labs and wt trends  Reason for Assessment: Length of stay and Malnutrition screen score = 2  79 y.o. male  Admitting Dx: Hypoglycemia associated with diabetes  ASSESSMENT: RD drawn to pt due to length of stay and malnutrition screen re-assessment. His weight hx shows severe wt gain since admission 24#,18%??? Possible mechanical malfunction. His urine output is good now. CHO Modified diet with limited po intake per meal data. Suspect inadequate oral intake and increased risk for malnutrition, mild-moderate inflammatory response given pt diabetes hx.  Height: Ht Readings from Last 1 Encounters:  12/10/12 5\' 6"  (1.676 m)    Weight: Wt Readings from Last 1 Encounters:  12/11/12 157 lb 13.6 oz (71.6 kg)    Ideal Body Weight: 142# (64.5 kg)  % Ideal Body Weight: 111%  Wt Readings from Last 10 Encounters:  12/11/12 157 lb 13.6 oz (71.6 kg)  12/11/12 157 lb 13.6 oz (71.6 kg)  12/11/12 157 lb 13.6 oz (71.6 kg)  03/21/12 130 lb 1.1 oz (59 kg)  12/26/11 129 lb 3 oz (58.6 kg)  06/27/11 131 lb (59.421 kg)  06/15/11 123 lb 10.9 oz (56.1 kg)    Usual Body Weight: 130#  % Usual Body Weight: 120%  BMI:  Body mass index is 25.49 kg/(m^2).overweight ??  Estimated Nutritional Needs: Kcal: 1775-2130 Protein: 80-90 gr Fluid: 2100 ml/day  Skin: perineum incision   Diet Order: Carb Control  EDUCATION NEEDS: -Education needs addressed   Intake/Output Summary (Last 24 hours) at 12/11/12 1003 Last data filed at 12/11/12 0600  Gross per 24 hour  Intake 2022.5 ml  Output   2600 ml  Net -577.5 ml    Last BM: 12/10/12  Labs:   Recent Labs Lab 12/05/12 0622 12/08/12 0551 12/10/12 0538  NA 138 135 135  K 3.6 4.3 3.1*  CL 109 106 100  CO2 22 23 25   BUN 24* 19 15  CREATININE 1.12 0.93 0.91  CALCIUM 7.8* 8.0* 8.7  MG 2.3  --   --   GLUCOSE 147* 180* <20*    CBG (last 3)   Recent Labs  12/11/12 0635 12/11/12 0731 12/11/12 0934  GLUCAP 167* 164* 221*    Scheduled Meds: . aspirin  81 mg Oral q morning - 10a  . lisinopril  10 mg Oral Daily  . polyethylene glycol  17 g Oral Daily  . senna-docusate  1 tablet Oral QHS    Continuous Infusions:   Past Medical History  Diagnosis Date  . Diabetes mellitus   . Inner ear inflammation   . Vertigo   . Urinary retention 12/04/2012  . Bladder stone 12/09/2012    S/p CYSTOSCOPY WITH LITHOLAPAXY (N/A)  . Hypertension     Past Surgical History  Procedure Laterality Date  . Cystoscopy N/A 12/08/2012    Procedure: CYSTOSCOPY FLEXIBLE;  Surgeon: Ky Barban, MD;  Location: [facility];  Service: Urology;  Laterality: N/A;  . Cystoscopy with litholapaxy N/A 12/09/2012    Procedure: CYSTOSCOPY WITH LITHOLAPAXY;  Surgeon: Ky Barban, MD;  Location: [facility];  Service: Urology;  Laterality: N/A;  .  Holmium laser application N/A 12/09/2012    Procedure: HOLMIUM LASER APPLICATION;  Surgeon: Ky Barban, MD;  Location: [facility];  Service: Urology;  Laterality: N/A;    Royann Shivers MS,RD,LDN,CSG Office: [phone] Pager: [phone]

## 2012-12-11 NOTE — Consult Note (Signed)
NAME:  BERK, PILOT NO.:  MEDICAL RECORD NO.:  [id number]  LOCATION:                                 FACILITY:  PHYSICIAN:  Ky Barban, M.D.    DATE OF BIRTH:  DATE OF CONSULTATION:  12/07/2012 DATE OF DISCHARGE:                                CONSULTATION   CHIEF COMPLAINT:  Urinary retention.  This 79 year old gentleman came to the emergency room with difficulty to void.  Foley catheter inserted.  He was found to be in urinary retention.  About 800 mL of urine was recovered from the bladder.  He is admitted for further workup and management.  He denies any symptoms of prostatism.  No history of gross hematuria, fever, or chills.  PAST MEDICAL HISTORY:  He has history of hypertension and diabetes non- insulin dependent.  PERSONAL HISTORY:  He does not smoke or drink.  REVIEW OF SYSTEMS:  Unremarkable.  PHYSICAL EXAMINATION:  GENERAL:  Moderately built male, not in acute distress, fully conscious, alert, oriented. VITAL SIGNS:  Blood pressure 130/80, temperature is normal. ABDOMEN:  Soft, flat.  Liver, spleen, kidneys not palpable.  No CVA tenderness. GU:  External genitalia is uncircumcised, meatus adequate.  Has Foley catheter in place, draining clear urine.  Testicles are normal. RECTAL:  Normal sphincter tone.  Prostate 2+, smooth, and firm.  IMPRESSION:  Probably benign prostatic hypertrophy with bladder neck obstruction.  Recommend cystoscopy under local anesthesia in the hospital.     Ky Barban, M.D.     MIJ/MEDQ  D:  12/10/2012  T:  12/10/2012  Job:  [id number]

## 2012-12-11 NOTE — Progress Notes (Signed)
PT Cancellation Note  Patient Details Name: Charles Gibbs MRN: [id number] DOB: 1927/12/06   Cancelled Treatment:    Reason Eval/Treat Not Completed: Fatigue/lethargy limiting ability to participate  Spoke with nurse tech before attempting to see pt who states that she recently helped him to the commode. She states that he did very well with walking and transfers. Attempted to see pt but he states that he is tired and would like to wait until tomorrow for PT. Pt states that he feels confident with gait and transfers. Will attempt to see pt again tomorrow.  Seth Bake, PTA  12/11/2012, 5:15 PM

## 2012-12-11 NOTE — Progress Notes (Signed)
Approximately 5 hours after foley removed asked patient to attempt to void via urinal. Patient not feeling any pressure or urgency to void at this time. Unable to do so. Bladder scan showed 425 cc present. Asked patient to use BSC in attempts to bring on feeling. Patient still unable to void and no pressure noted per patient. Dr. Irene Limbo notified. Orders given to continue to monitor and attempt voiding trial at this time.

## 2012-12-12 LAB — BASIC METABOLIC PANEL
CO2: 28 mEq/L (ref 19–32)
Chloride: 102 mEq/L (ref 96–112)
Glucose, Bld: 174 mg/dL — ABNORMAL HIGH (ref 70–99)
Potassium: 4.3 mEq/L (ref 3.5–5.1)
Sodium: 135 mEq/L (ref 135–145)

## 2012-12-12 LAB — CBC
HCT: 34.5 % — ABNORMAL LOW (ref 39.0–52.0)
Hemoglobin: 12 g/dL — ABNORMAL LOW (ref 13.0–17.0)
MCV: 89.4 fL (ref 78.0–100.0)
Platelets: 206 10*3/uL (ref 150–400)
RBC: 3.86 MIL/uL — ABNORMAL LOW (ref 4.22–5.81)
WBC: 9.3 10*3/uL (ref 4.0–10.5)

## 2012-12-12 LAB — GLUCOSE, CAPILLARY
Glucose-Capillary: 146 mg/dL — ABNORMAL HIGH (ref 70–99)
Glucose-Capillary: 220 mg/dL — ABNORMAL HIGH (ref 70–99)

## 2012-12-12 MED ORDER — GLIMEPIRIDE 1 MG PO TABS: 1.0000 mg | ORAL_TABLET | Freq: Every morning | ORAL | Status: DC

## 2012-12-12 MED ORDER — LISINOPRIL 10 MG PO TABS: 10.0000 mg | ORAL_TABLET | Freq: Every day | ORAL | Status: DC

## 2012-12-12 MED ORDER — METFORMIN HCL 500 MG PO TABS: 500.0000 mg | ORAL_TABLET | Freq: Two times a day (BID) | ORAL | Status: DC

## 2012-12-12 NOTE — Progress Notes (Signed)
[facility] PROGRESS NOTE  JAHMIL MACLEOD ZOX:[id number] DOB: [birth year] DOA: 12/03/2012 PCP: Rudi Heap, MD  Assessment/Plan: 1. Profound symptomatic Hypoglycemia: Resolved. Was associated with acute encephalopathy. Secondary to high-dose Levemir 7/22. Previously patient not on long-acting insulin or oral anti-hyperglycemics. No focal deficits. 2. DM type 2 uncontrolled with hypoglycemia in AM: Recurrent hypoglycemia has been demonstrated this admission and all long-acting insulin and oral anti-hyperglycemics were discontinued. HgbA1c 7.6. Was hyperglycemic on admission but this was likely secondary to the consuming of an entire container of blueberry cobbler per his daughter. Despite no treatment over the last 48 hours blood sugar has not been above 250. This only close outpatient monitoring the patient freely admits he does not check his blood sugars. It appears that hypoglycemia is riskier than hyperglycemia at this point. Discontinue insulin and Amaryl on discharge. Start metformin. Home health RN to follow. Close outpatient followup. 3. Urinary retention and hematuria secondary to bladder stone: Urinary retention was noted and 1.3 L was evacuated from the bladder initially. No sign of infection. Urology following patient. Status post removal of bladder stone. Failed voiding trial, will need TURP. This will be performed next week as an outpatient 4. Dehydration: resolved with IVF. 5. Fall at home, mild rhabdomyolysis: cause of fall unknown, possibly dehydration; but hypoglycemia highly suspected. Vitamin B12 level and TSH within normal limits. Continue physical therapy. 6. Hypoxia: Resolved. Not tachypneic. Appears nontoxic. No signs or symptoms of aspiration. 7. Hypokalemia: Resolved. 8. Hypertension: Stable. Continue current therapy. 9. Hyperlipidemia: restart statin on discharge.  10. Mild deconditioning. Physical therapist recommends home health physical therapy as the patient would  likely refuse skilled nursing facility placement. Rolling walker and bedside commode have been ordered.  Patient has no history of heart disease and no history of chest pain. He is at moderate risk for surgery mostly secondary to his age 79 but no further evaluation is suggested prior to surgery.   Keep Coude catheter on discharge.  Code Status: full code DVT prophylaxis: SCDs  Family Communication:  Disposition Plan: patient refused ALF. HH RN, PT, Aide  Brendia Sacks, MD  [facility]  Pager [phone] If 7PM-7AM, please contact night-coverage at [email or web address], password [facility] 12/12/2012, 1:27 PM  LOS: 9 days   Clinical Summary: 79 year old man presented to ED after a fall (unclear etiology) and prolonged inability to get up off floor at home. Noted to be disheveled. Admitted for dehydration, hyperglycemia without ketosis, hematuria without evidence of UTI. Further evaluation was notable for bladder distension, urology was consulted and patient underwent cystoscopy with bladder stone removal. hospitalization complicated by recurrent hypoglycemia and all medications were stopped.  Consultants:  Urology   PT--home health.  Procedures: 7/22 cystoscopy, Bladder stone removal/LITHOLAPAXY.   HPI/Subjective: No further hypoglycemia. He feels well. No complaints. Eating well. No pain. Unfortunately he could not void and so a catheter was replaced.  Objective: Filed Vitals:   12/12/12 0500 12/12/12 0600 12/12/12 0700 12/12/12 0800  BP: 140/77  161/95   Pulse: 81 81 82   Temp:    97.6 F (36.4 C)  TempSrc:    Oral  Resp: 19 14 18    Height:      Weight:      SpO2: 96% 98% 97%     Intake/Output Summary (Last 24 hours) at 12/12/12 1327 Last data filed at 12/12/12 0200  Gross per 24 hour  Intake    360 ml  Output   1400 ml  Net  -1040 ml     Filed  Weights   12/10/12 1143 12/11/12 0500 12/12/12 0400  Weight: 70 kg (154 lb 5.2 oz) 71.6 kg (157 lb 13.6 oz) 69.8 kg (153 lb  14.1 oz)    Exam:   Afebrile, vital signs stable.   General: Appears calm and comfortable. Speech is fluent and clear.  Psychiatric: Alert, well-appearing. Oriented to self, location, year. Thought it was August.  Cardiovascular: Regular rate and rhythm. No murmur, rub, gallop. No significant lower extremity edema.  Respiratory: Clear to auscultation bilaterally. No wheezes, rales, rhonchi. Normal respiratory effort.  Abdomen: Soft, nontender, nondistended.  Skin: Appears grossly unremarkable.  Data Reviewed:  Excellent urine output continues  No hypoglycemia. Blood sugars 146-220. Fasting blood sugar was 181.  Pending studies:   None  Scheduled Meds: . aspirin  81 mg Oral q morning - 10a  . feeding supplement  237 mL Oral TID BM  . lisinopril  10 mg Oral Daily  . polyethylene glycol  17 g Oral Daily  . senna-docusate  1 tablet Oral QHS   Continuous Infusions:    Principal Problem:   Hypoglycemia associated with diabetes Active Problems:   DM (diabetes mellitus), type 2, uncontrolled   Dehydration   Fall at home   Hematuria   Hypokalemia   Urinary retention   Rhabdomyolysis   Thrombocytopenia, unspecified   Unspecified essential hypertension   Bladder stone

## 2012-12-12 NOTE — Progress Notes (Signed)
16 french coude catheter inserted by Dr. Jerre Simon. 1000cc amber urine returned.  Dr. Jerre Simon would like patient to be medically cleared for TURP next Tuesday.

## 2012-12-12 NOTE — Discharge Summary (Signed)
Physician Discharge Summary  Charles Gibbs AVW:[id number] DOB: Feb 25, 1928 DOA: 12/03/2012  PCP: Rudi Heap, MD  Admit date: 12/03/2012 Discharge date: 12/12/2012  Recommendations for Outpatient Follow-up:  1. Close attention to diabetes. Because of recurrent hypoglycemia and insulin and Amaryl have been discontinued. See further discussion below. Close monitoring of blood sugars is important.. May need to reinstitute anti-hyperglycemics. 2. Home health physical therapy, RN, aide to assist with disease management, diabetic teaching 3. Urinary retention, TURP planned next week, patient cleared for surgery   Follow-up Information   Follow up with [facility].   Contact information:   [address] [place] [address] [phone]      Follow up with Rudi Heap, MD In 1 week.   Contact information:   [address] [place] [address] [phone]       Follow up with Ky Barban, MD. (office will contact you for surgery on Tuesday 7/29)    Contact information:   [address] [place] [address] [phone]      Discharge Diagnoses:  1. Profound symptomatic hypoglycemia 2. Diabetes mellitus type 2, uncontrolled with hypoglycemia, recurrent 3. Urinary retention and hematuria secondary to bladder stone 4. Dehydration 5. Fall at home, mild rhabdomyolysis 6. Hypertension  Discharge Condition: Improved Disposition: Home  Diet recommendation: Diabetic  Filed Weights   12/10/12 1143 12/11/12 0500 12/12/12 0400  Weight: 70 kg (154 lb 5.2 oz) 71.6 kg (157 lb 13.6 oz) 69.8 kg (153 lb 14.1 oz)    History of present illness:  79 year old man presented to ED after a fall (unclear etiology) and prolonged inability to get up off floor at home. Noted to be disheveled. Admitted for dehydration, hyperglycemia without ketosis, hematuria without evidence of UTI.   Hospital Course:  Hyperglycemia rapidly resolved and in fact the patient became  hypoglycemic. Despite dose reduction in his insulin hypoglycemia recurred and the patient was taken off insulin and oral anti-hyperglycemics. Etiology of hyperglycemia on admission was likely secondary to consumption of an entire blueberry collar. To be started on metformin with close outpatient followup and home health.  Further evaluation was notable for bladder distension, urology was consulted and patient underwent cystoscopy with bladder stone removal. Despite this patient could not void and so catheter has been replaced. TURP planned as an outpatient. hospitalization complicated by recurrent hypoglycemia secondary to high-dose insulin mistakenly administered. Fortunately no complications or sequela seen.   1. Profound symptomatic Hypoglycemia: Resolved. Was associated with acute encephalopathy. Secondary to high-dose Levemir 7/22. No focal deficits. 2. DM type 2 uncontrolled with hypoglycemia in AM: Recurrent hypoglycemia has been demonstrated this admission and all long-acting insulin and oral anti-hyperglycemics were discontinued. HgbA1c 7.6. Was hyperglycemic on admission but this was likely secondary to the consuming of an entire container of blueberry cobbler per his daughter. Despite no treatment over the last 48 hours blood sugar has not been above 250. His will require close outpatient monitoring. the patient freely admits he does not check his blood sugars. It appears that hypoglycemia is riskier than hyperglycemia at this point. Discontinue insulin and Amaryl on discharge. Start metformin. Home health RN to follow. Close outpatient followup. 3. Urinary retention and hematuria secondary to bladder stone: Urinary retention was noted and 1.3 L was evacuated from the bladder initially. No sign of infection. Urology following patient. Status post removal of bladder stone. Failed voiding trial, will need TURP. This will be performed next week as an outpatient 4. Dehydration: resolved with  IVF. 5. Fall  at home, mild rhabdomyolysis: cause of fall unknown, possibly dehydration; but hypoglycemia highly suspected. Vitamin B12 level and TSH within normal limits. Continue physical therapy. 6. Hypoxia: Resolved. Not tachypneic. Appears nontoxic. No signs or symptoms of aspiration. 7. Hypokalemia: Resolved. 8. Hypertension: Stable. Continue current therapy. 9. Hyperlipidemia: restart statin on discharge.  10. Mild deconditioning. Physical therapist recommends home health physical therapy as the patient would likely refuse skilled nursing facility placement. Rolling walker and bedside commode have been ordered.  Patient has no history of heart disease and no history of chest pain. He is at moderate risk for surgery mostly secondary to his age but no further evaluation is suggested prior to surgery.  Consultants:  Urology  PT--home health. Procedures:  7/22 cystoscopy, Bladder stone removal/LITHOLAPAXY.  Discharge Instructions  Discharge Orders   Future Orders Complete By Expires     Diet Carb Modified  As directed     Discharge instructions  As directed     Comments:      Be sure to check your blood sugars at least once per day. Note insulin and Amaryl have been stopped because of low blood sugars. You have been started on metformin. Call your physician or seek immediate medical attention for blood sugar less than 70 or greater than 400. Call your physician or seek immediate medical attention for worsening of condition. Urology office will contact you for surgery appointment next week.    Face-to-face encounter (required for Medicare/Medicaid patients)  As directed     Comments:      I Carrera Kiesel certify that this patient is under my care and that I, or a nurse practitioner or physician's assistant working with me, had a face-to-face encounter that meets the physician face-to-face encounter requirements with this patient on 12/12/2012. The encounter with the patient was in whole,  or in part for the following medical condition(s) which is the primary reason for home health care (List medical condition): Diabetes mellitus, hypoglycemia, hyperglycemia, urinary retention, urinary catheter care    Questions:      The encounter with the patient was in whole, or in part, for the following medical condition, which is the primary reason for home health care:  Diabetes mellitus, hypoglycemia, hyperglycemia, urinary retention, urinary catheter care    I certify that, based on my findings, the following services are medically necessary home health services:  Nursing    Physical therapy    My clinical findings support the need for the above services:  Leaving home exacerbates symptoms (pain, dyspnea, anxiety etc.)    Further, I certify that my clinical findings support that this patient is homebound due to:  Leaving home exacerbates symptoms (dyspnea, pain, anxiety, etc)    Reason for Medically Necessary Home Health Services:  Skilled Nursing- Changes in Medication/Medication Management    Home Health  As directed     Questions:      To provide the following care/treatments:  PT    RN    Home Health Aide    Increase activity slowly  As directed         Medication List    STOP taking these medications       glimepiride 4 MG tablet  Commonly known as:  AMARYL     insulin detemir 100 UNIT/ML injection  Commonly known as:  LEVEMIR      TAKE these medications       aspirin 81 MG chewable tablet  Chew 81 mg by mouth every morning.  atorvastatin 10 MG tablet  Commonly known as:  LIPITOR  Take 10 mg by mouth every morning.     lisinopril 10 MG tablet  Commonly known as:  PRINIVIL,ZESTRIL  Take 1 tablet (10 mg total) by mouth daily.     meclizine 25 MG tablet  Commonly known as:  ANTIVERT  Take 25 mg by mouth 3 (three) times daily as needed.     metFORMIN 500 MG tablet  Commonly known as:  GLUCOPHAGE  Take 1 tablet (500 mg total) by mouth 2 (two) times daily with  a meal.     naproxen sodium 220 MG tablet  Commonly known as:  ANAPROX  Take 220 mg by mouth daily as needed (headaches).     ONE-A-DAY 50 PLUS Tabs  Take 1 tablet by mouth at bedtime.       No Known Allergies  The results of significant diagnostics from this hospitalization (including imaging, microbiology, ancillary and laboratory) are listed below for reference.    Significant Diagnostic Studies: Dg Chest 2 View  12/03/2012   *RADIOLOGY REPORT*  Clinical Data: Status post fall.  CHEST - 2 VIEW  Comparison: Acute abdominal series 04/30/2012.  Findings: The heart size and mediastinal contours are stable. There is no evidence of mediastinal hematoma.  The lungs are clear. There is no pleural effusion or pneumothorax. Degenerative changes of the thoracic spine are noted.  No acute osseous findings are seen.  IMPRESSION: No active cardiopulmonary process or post-traumatic findings demonstrated.   Original Report Authenticated By: Charles Gibbs, M.D.   US Renal  12/03/2012   *RADIOLOGY REPORT*  Clinical Data: Hematuria.  Knot urinating.  RENAL/URINARY TRACT ULTRASOUND COMPLETE  Comparison:  None available.  Findings:  Right Kidney:  No hydronephrosis.  Well-preserved cortex.  Normal size and parenchymal echotexture without focal abnormalities. The maximal length is 10.7 cm, within normal limits.  Left Kidney:  The left kidney is atrophic, measuring 5.6 cm maximally.  The kidney is echogenic.  Bladder:  Urinary bladder is distended.  The estimated volume is 755 ml.  No obstructing mass is evident  IMPRESSION:  1.  Atrophic left kidney. 2.  Normal appearance of the right kidney. 3.  Moderately distended urinary bladder.   Original Report Authenticated By: Marin Roberts, M.D.    Microbiology: Recent Results (from the past 240 hour(s))  SURGICAL PCR SCREEN     Status: None   Collection Time    12/08/12  1:02 PM      Result Value Range Status   MRSA, PCR NEGATIVE  NEGATIVE Final    Staphylococcus aureus NEGATIVE  NEGATIVE Final   Comment:            The Xpert SA Assay (FDA     approved for NASAL specimens     in patients over 83 years of age),     is one component of     a comprehensive surveillance     program.  Test performance has     been validated by [employer] for patients greater     than or equal to 86 year old.     It is not intended     to diagnose infection nor to     guide or monitor treatment.     Labs: Basic Metabolic Panel:  Recent Labs Lab 12/08/12 0551 12/10/12 0538 12/12/12 0441  NA 135 135 135  K 4.3 3.1* 4.3  CL 106 100 102  CO2 23 25 28  GLUCOSE 180* <20* 174*  BUN 19 15 22   CREATININE 0.93 0.91 1.15  CALCIUM 8.0* 8.7 8.3*   CBC:  Recent Labs Lab 12/06/12 0552 12/08/12 0551 12/10/12 0538 12/12/12 0441  WBC 8.4 6.7 13.9* 9.3  HGB 13.0 12.4* 13.7 12.0*  HCT 36.5* 34.9* 38.3* 34.5*  MCV 86.7 87.3 87.0 89.4  PLT 154 157 197 206   CBG:  Recent Labs Lab 12/12/12 0203 12/12/12 0427 12/12/12 0742 12/12/12 0939 12/12/12 1130  GLUCAP 168* 168* 146* 181* 220*    Principal Problem:   Hypoglycemia associated with diabetes Active Problems:   DM (diabetes mellitus), type 2, uncontrolled   Dehydration   Fall at home   Hematuria   Hypokalemia   Urinary retention   Rhabdomyolysis   Thrombocytopenia, unspecified   Unspecified essential hypertension   Bladder stone   Time coordinating discharge: 60 minutes  Signed:  Brendia Sacks, MD [facility] 12/12/2012, 2:08 PM

## 2012-12-12 NOTE — Progress Notes (Signed)
Inpatient Diabetes Program Recommendations  AACE/ADA: New Consensus Statement on Inpatient Glycemic Control (2013)  Target Ranges:  Prepandial:   less than 140 mg/dL      Peak postprandial:   less than 180 mg/dL (1-2 hours)      Critically ill patients:  140 - 180 mg/dL   Results for Charles Gibbs, Charles Gibbs (MRN [id number]) as of 12/12/2012 07:41  Ref. Range 12/11/2012 07:31 12/11/2012 09:34 12/11/2012 11:25 12/11/2012 13:36 12/11/2012 15:28 12/11/2012 17:52 12/11/2012 19:41 12/11/2012 22:00 12/11/2012 23:53 12/12/2012 02:03 12/12/2012 04:27  Glucose-Capillary Latest Range: 70-99 mg/dL 782 (H) 956 (H) 213 (H) 171 (H) 154 (H) 169 (H) 209 (H) 216 (H) 208 (H) 168 (H) 168 (H)   Inpatient Diabetes Program Recommendations Correction (SSI): Please consider reordering CBGs ACHS with Novolog sensitive correction scale.  Note: Hypoglycemia secondary to high dose Levemir has resolved and blood glucose over the past 24 hours has ranged from 154-221 mg/dl.  Blood glucose continues to be monitored every 2 hours at this time.  Please consider reordering CBGs ACHS with Novolog sensitive correction scale.  Will continue to follow.  Thanks, Orlando Penner, RN, MSN, CCRN Diabetes Coordinator Inpatient Diabetes Program [phone]

## 2012-12-12 NOTE — Progress Notes (Signed)
Went into retention again foley catheter coude tip inserted will schedule turp on Tuesday no gaurutees it will work he may have to liv e with foley catheter. He should have medical clearance before turp on Tuesday . After clearance let me know so i can plan turp on Tuesday.

## 2012-12-12 NOTE — Progress Notes (Signed)
Family (daughter and granddaughter) were educated on how to chg foley bag from standard gravity drainage bed to leg strap bag and also how to empty foley. Pt was sent home with front wheeled walker and bedside commode from [facility]. Family is aware that [facility] will also be sending an aide out to residence each day to check on pt. Reminded them of TURP procedure that is scheduled on Tues 7/29 and to call Dr. Rudean Haskell office to get an approximate time for arrival for procedure. Gave new medication scripts, told them which ones to stop and those to continue. Pt was discharged via wheelchair to private vehicle along with personal hygiene items and foley care equipment.

## 2012-12-12 NOTE — Progress Notes (Signed)
Attempted to do a In and out catheter since pt has not voided since 0200. When attempting to insert catheter, resistance is being met. Dr. Irene Limbo made aware and asked Dr. Jerre Simon be made aware. Dr. Jerre Simon aware and ask for coude catheter to be placed. Awaiting for catheter to arrive.

## 2012-12-13 ENCOUNTER — Emergency Department (HOSPITAL_COMMUNITY)
Admission: EM | Admit: 2012-12-13 | Discharge: 2012-12-13 | Disposition: A | Discharge status: Home / Self Care | Payer: PRIVATE HEALTH INSURANCE | Attending: Emergency Medicine | Admitting: Emergency Medicine

## 2012-12-13 ENCOUNTER — Encounter (HOSPITAL_COMMUNITY): Payer: Self-pay

## 2012-12-13 DIAGNOSIS — Y846 Urinary catheterization as the cause of abnormal reaction of the patient, or of later complication, without mention of misadventure at the time of the procedure: Secondary | ICD-10-CM | POA: Insufficient documentation

## 2012-12-13 DIAGNOSIS — T839XXA Unspecified complication of genitourinary prosthetic device, implant and graft, initial encounter: Secondary | ICD-10-CM

## 2012-12-13 DIAGNOSIS — Z7982 Long term (current) use of aspirin: Secondary | ICD-10-CM | POA: Insufficient documentation

## 2012-12-13 DIAGNOSIS — Z79899 Other long term (current) drug therapy: Secondary | ICD-10-CM | POA: Insufficient documentation

## 2012-12-13 DIAGNOSIS — I10 Essential (primary) hypertension: Secondary | ICD-10-CM | POA: Insufficient documentation

## 2012-12-13 DIAGNOSIS — E119 Type 2 diabetes mellitus without complications: Secondary | ICD-10-CM | POA: Insufficient documentation

## 2012-12-13 DIAGNOSIS — Z87448 Personal history of other diseases of urinary system: Secondary | ICD-10-CM | POA: Insufficient documentation

## 2012-12-13 DIAGNOSIS — Z8669 Personal history of other diseases of the nervous system and sense organs: Secondary | ICD-10-CM | POA: Insufficient documentation

## 2012-12-13 DIAGNOSIS — T83091A Other mechanical complication of indwelling urethral catheter, initial encounter: Secondary | ICD-10-CM | POA: Insufficient documentation

## 2012-12-13 LAB — STONE ANALYSIS

## 2012-12-13 NOTE — ED Notes (Signed)
EMS reports pt was taken off of insulin while in the hospital.  CBG 228 this morning.

## 2012-12-13 NOTE — ED Notes (Signed)
Pt had low grade fever. Notified pt's primary nurse.  Instructed pt's family to continue to monitor and call surgeon if pt continues to have fever or return here as needed. Pt's daughter verbalized understanding.  Pt's daughter did not want to change the foley drainage bag to a leg bag.

## 2012-12-13 NOTE — ED Notes (Signed)
Pt presents via EMS secondary to c/o bleeding around foley catheter site. Pt was DC'd yesterday after kidney stone removal procedure.  Pt has 16 french foley intact, draining tea colored urine. Small amount blood noted in and around peri area. Pt thinks he got it caught in linens last night. However pt is confused.  NAD noted at this time. No new drainage or bleeding noted at foley insertion.

## 2012-12-13 NOTE — ED Notes (Signed)
EMS reports pt recently had surgery for kidney stones and was discharged yesterday with foley.  EMS reports family noticed pt bleeding around foley insertion site.  PT alert but confused.  EMS reports family thinks pt may have tried to pull out foley.  Pt says he didn't remember pulling the foley.

## 2012-12-13 NOTE — ED Provider Notes (Signed)
CSN: [id number]     Arrival date & time 12/13/12  1153 History  This chart was scribed for Charles Skeens, MD by Bennett Scrape, ED Scribe. This patient was seen in room [facility] and the patient's care was started at 1:30 PM.   First MD Initiated Contact with Patient 12/13/12 1304     Chief Complaint  Patient presents with  . bleeding around foley     The history is provided by the patient. No language interpreter was used.    HPI Comments: NGHIA MCENTEE is a 79 y.o. male brought in by ambulance, who presents to the Emergency Department complaining of intermittent bleeding around his foley insertion site noted this morning. The site is not actively bleeding currently. He had a recent surgery for kidney stones and was discharged yesterday with a foley. There were no complications upon initial discharge. Pt denies being on any anticoagulants currently. He denies fevers, chills and abdominal pain as associated symptoms. He has a h/o DM and HTN.  Per nursing noted, EMS stated family voiced concern over the pt trying to pull the foley out. Pt denied this to EMS.  Lives alone in his house  Past Medical History  Diagnosis Date  . Diabetes mellitus   . Inner ear inflammation   . Vertigo   . Urinary retention 12/04/2012  . Bladder stone 12/09/2012    S/p CYSTOSCOPY WITH LITHOLAPAXY (N/A)  . Hypertension    Past Surgical History  Procedure Laterality Date  . Cystoscopy N/A 12/08/2012    Procedure: CYSTOSCOPY FLEXIBLE;  Surgeon: Ky Barban, MD;  Location: [facility];  Service: Urology;  Laterality: N/A;  . Cystoscopy with litholapaxy N/A 12/09/2012    Procedure: CYSTOSCOPY WITH LITHOLAPAXY;  Surgeon: Ky Barban, MD;  Location: [facility];  Service: Urology;  Laterality: N/A;  . Holmium laser application N/A 12/09/2012    Procedure: HOLMIUM LASER APPLICATION;  Surgeon: Ky Barban, MD;  Location: [facility];  Service: Urology;  Laterality: N/A;   Family History  Problem  Relation Age of Onset  . Stroke Mother   . Cancer Mother   . Heart failure Mother   . Heart failure Father    History  Substance Use Topics  . Smoking status: Never Smoker   . Smokeless tobacco: Never Used  . Alcohol Use: No    Review of Systems  Constitutional: Negative for fever and chills.  Cardiovascular: Negative for leg swelling.  Gastrointestinal: Negative for abdominal pain.  Skin: Negative for rash.  All other systems reviewed and are negative.    Allergies  Review of patient's allergies indicates no known allergies.  Home Medications   Current Outpatient Rx  Name  Route  Sig  Dispense  Refill  . aspirin 81 MG chewable tablet   Oral   Chew 81 mg by mouth every morning.          Marland Kitchen atorvastatin (LIPITOR) 10 MG tablet   Oral   Take 10 mg by mouth every morning.         Marland Kitchen lisinopril (PRINIVIL,ZESTRIL) 10 MG tablet   Oral   Take 1 tablet (10 mg total) by mouth daily.   30 tablet   0   . meclizine (ANTIVERT) 25 MG tablet   Oral   Take 25 mg by mouth 3 (three) times daily as needed.         . metFORMIN (GLUCOPHAGE) 500 MG tablet   Oral   Take 1 tablet (500 mg total)  by mouth 2 (two) times daily with a meal.   60 tablet   0   . Multiple Vitamins-Minerals (ONE-A-DAY 50 PLUS) TABS   Oral   Take 1 tablet by mouth at bedtime.         . naproxen sodium (ANAPROX) 220 MG tablet   Oral   Take 220 mg by mouth daily as needed (headaches).          Triage Vitals: BP 172/68  Pulse 93  Temp(Src) 97.3 F (36.3 C) (Oral)  SpO2 97%  Physical Exam  Nursing note and vitals reviewed. Constitutional: He is oriented to person, place, and time. He appears well-developed and well-nourished. No distress.  HENT:  Head: Normocephalic and atraumatic.  Eyes: EOM are normal.  Neck: Neck supple. No tracheal deviation present.  Cardiovascular: Normal rate and regular rhythm.   No murmur heard. Pulmonary/Chest: Effort normal and breath sounds normal. No  respiratory distress. He has no wheezes.  Abdominal: Soft. Bowel sounds are normal. He exhibits no distension. There is no tenderness.  Genitourinary:  Testicles normal lie, no tenderness, uncircumsized, mild dried blood around foley entrance, foley is in place and appears to be working, light brown urine present with no blood  Musculoskeletal: Normal range of motion. He exhibits no edema (no ankle swelling).  Neurological: He is alert and oriented to person, place, and time. No cranial nerve deficit.  Skin: Skin is warm and dry. No rash noted.  Mild ecchymosis on right anterior iliac crest  Psychiatric: He has a normal mood and affect. His behavior is normal.    ED Course   Procedures (including critical care time)  DIAGNOSTIC STUDIES: Oxygen Saturation is 97% on room air, normal by my interpretation.    COORDINATION OF CARE: 1:38 PM-Discussed treatment plan which includes follow up with urology with pt at bedside and pt agreed to plan.   Labs Reviewed - No data to display No results found. No diagnosis found.  MDM  I personally performed the services described in this documentation, which was scribed in my presence. The recorded information has been reviewed and is accurate. Mild dried blood around foley, likely irritation.  No signs of active infection.  Foley bag light brown, no blood clots, fluid passing easily. Filed Vitals:   12/13/12 1613  BP: 180/86  Pulse: 93  Temp: 99.2 F (37.3 C)  Resp: 18   Well appearing in ED.  DC with urology fup.   Charles Skeens, MD 12/14/12 2234

## 2012-12-13 NOTE — ED Notes (Signed)
Pt's daughter returned call, stating she would be here to get  Him asap.

## 2012-12-13 NOTE — ED Notes (Signed)
Pt's family called and notified of said pt's pending DC from the emergency room, Family member sighed heavily into phone and replied she would call pt's daughter at work to arrange a ride home. Ed phone # left with family member

## 2012-12-15 ENCOUNTER — Emergency Department (HOSPITAL_COMMUNITY)
Admission: EM | Admit: 2012-12-15 | Discharge: 2012-12-15 | Discharge status: Against Medical Advice | Payer: PRIVATE HEALTH INSURANCE | Attending: Urology | Admitting: Urology

## 2012-12-15 LAB — URINE CULTURE

## 2012-12-16 ENCOUNTER — Encounter: Payer: Self-pay | Admitting: Family Medicine

## 2012-12-16 ENCOUNTER — Ambulatory Visit (INDEPENDENT_AMBULATORY_CARE_PROVIDER_SITE_OTHER): Payer: PRIVATE HEALTH INSURANCE | Admitting: Family Medicine

## 2012-12-16 VITALS — BP 151/66 | HR 83 | Temp 98.8°F | Ht 66.0 in | Wt 153.0 lb

## 2012-12-16 DIAGNOSIS — R300 Dysuria: Secondary | ICD-10-CM

## 2012-12-16 DIAGNOSIS — R231 Pallor: Secondary | ICD-10-CM

## 2012-12-16 DIAGNOSIS — E1165 Type 2 diabetes mellitus with hyperglycemia: Secondary | ICD-10-CM

## 2012-12-16 DIAGNOSIS — R5381 Other malaise: Secondary | ICD-10-CM

## 2012-12-16 DIAGNOSIS — N390 Urinary tract infection, site not specified: Secondary | ICD-10-CM

## 2012-12-16 DIAGNOSIS — R5383 Other fatigue: Secondary | ICD-10-CM

## 2012-12-16 LAB — POCT CBC
Granulocyte percent: 88.9 %G — AB (ref 37–80)
Hemoglobin: 12.5 g/dL — AB (ref 14.1–18.1)
Lymph, poc: 1.1 (ref 0.6–3.4)
MCH, POC: 31 pg (ref 27–31.2)
MPV: 7.7 fL (ref 0–99.8)
POC Granulocyte: 12.9 — AB (ref 2–6.9)
POC LYMPH PERCENT: 7.6 %L — AB (ref 10–50)
Platelet Count, POC: 284 10*3/uL (ref 142–424)
RBC: 4 M/uL — AB (ref 4.69–6.13)

## 2012-12-16 LAB — POCT GLYCOSYLATED HEMOGLOBIN (HGB A1C): Hemoglobin A1C: 7

## 2012-12-16 SURGERY — TURP (TRANSURETHRAL RESECTION OF PROSTATE)
Anesthesia: Spinal

## 2012-12-16 NOTE — Patient Instructions (Signed)
Take antibiotic as directed Drink plenty of fluids Be careful not to fall at home use walker and assistance from family Return to clinic to be rechecked on Friday

## 2012-12-16 NOTE — Addendum Note (Signed)
Addended by: Lisbeth Ply C on: 12/16/2012 03:05 PM   Modules accepted: Orders

## 2012-12-16 NOTE — Addendum Note (Signed)
Addended by: Bearl Mulberry on: 12/16/2012 02:58 PM   Modules accepted: Orders

## 2012-12-16 NOTE — Addendum Note (Signed)
Addended by: Orma Render F on: 12/16/2012 04:44 PM   Modules accepted: Orders

## 2012-12-16 NOTE — Addendum Note (Signed)
Addended by: Lisbeth Ply C on: 12/16/2012 03:08 PM   Modules accepted: Orders

## 2012-12-16 NOTE — Progress Notes (Signed)
  Subjective:    Patient ID: Charles Gibbs, male    DOB: Dec 15, 1927, 79 y.o.   MRN: [id number]  HPI Patient presents today for treatment and management of UTI. He also complains of fatigue. He has a history of DTs mellitus type II uncontrolled.He also has a history of hypertension, hypokalemia, rhabdomyolysis, and multiple urinary tract problems. He was scheduled for a TURP by Dr. Jerre Simon, but this was postponed due to this fatigue and weakness and UTI. His daughter and granddaughter are with him today. They indicate that he has had a bad cough for several days since he was discharged from the hospital last Friday.  Review of Systems  Constitutional: Positive for chills, appetite change (low appetite) and fatigue.  HENT: Negative.   Eyes: Negative.   Respiratory: Negative.   Cardiovascular: Negative.   Gastrointestinal: Negative.   Endocrine: Negative.   Genitourinary: Positive for dysuria and difficulty urinating.  Musculoskeletal: Negative.   Skin: Negative.   Allergic/Immunologic: Negative.   Neurological: Positive for weakness. Negative for dizziness, seizures, syncope and headaches.  Hematological: Negative.   Psychiatric/Behavioral: Negative.        Objective:   Physical Exam  Vitals reviewed. Constitutional: He is oriented to person, place, and time. No distress.  Elderly frail older than appearing 79 year old man in a wheelchair with a Foley catheter  HENT:  Head: Normocephalic and atraumatic.  Right Ear: External ear normal.  Left Ear: External ear normal.  Nose: Nose normal.  Mouth/Throat: Oropharynx is clear and moist. No oropharyngeal exudate.  Eyes: Conjunctivae are normal. Right eye exhibits no discharge. Left eye exhibits no discharge. No scleral icterus.  Neck: Normal range of motion. Neck supple. No thyromegaly present.  Cardiovascular: Normal rate and regular rhythm.  Exam reveals no gallop.   No murmur heard. At 72 per minute  Pulmonary/Chest: Effort normal  and breath sounds normal. No respiratory distress. He has no wheezes. He has no rales.  Abdominal: There is tenderness.  Abdomen is slightly tender and there is a lot of gas and fullness  Musculoskeletal: He exhibits no edema.  Patient is so weak he is unable to walk in a wheelchair  Lymphadenopathy:    He has no cervical adenopathy.  Neurological: He is alert and oriented to person, place, and time.  Skin: Skin is dry. No rash noted. He is not diaphoretic. No erythema. There is pallor.  Psychiatric: Judgment normal.  Flat affect depressed mood          Assessment & Plan:  1. Dysuria - POCT UA - Microscopic Only - POCT urinalysis dipstick - POCT CBC - BMP8+EGFR  2. Fatigue - POCT CBC - BMP8+EGFR - Thyroid Panel With TSH  3. Pallor  4. UTI (urinary tract infection)  5. Insulin dependent type 2 diabetes mellitus, uncontrolled   Patient Instructions  Take antibiotic as directed Drink plenty of fluids Be careful not to fall at home use walker and assistance from family Return to clinic to be rechecked on Friday   Nyra Capes MD

## 2012-12-17 LAB — BMP8+EGFR
BUN/Creatinine Ratio: 24 — ABNORMAL HIGH (ref 10–22)
BUN: 30 mg/dL — ABNORMAL HIGH (ref 8–27)
Chloride: 96 mmol/L — ABNORMAL LOW (ref 97–108)
Creatinine, Ser: 1.26 mg/dL (ref 0.76–1.27)
GFR calc Af Amer: 61 mL/min/{1.73_m2} (ref >59)
GFR calc non Af Amer: 53 mL/min/{1.73_m2} — ABNORMAL LOW (ref >59)
Glucose: 138 mg/dL — ABNORMAL HIGH (ref 65–99)

## 2012-12-17 LAB — HEPATIC FUNCTION PANEL
Albumin: 3 g/dL — ABNORMAL LOW (ref 3.5–4.7)
Total Protein: 6 g/dL (ref 6.0–8.5)

## 2012-12-17 LAB — CK: Total CK: 87 U/L (ref 24–204)

## 2012-12-17 LAB — THYROID PANEL WITH TSH
T3 Uptake Ratio: 35 % (ref 24–39)
T4, Total: 6.8 ug/dL (ref 4.5–12.0)
TSH: 1.36 u[IU]/mL (ref 0.450–4.500)

## 2012-12-18 ENCOUNTER — Telehealth: Payer: Self-pay | Admitting: *Deleted

## 2012-12-18 NOTE — Telephone Encounter (Signed)
Multiple attempts to contact pt.

## 2012-12-19 ENCOUNTER — Ambulatory Visit (INDEPENDENT_AMBULATORY_CARE_PROVIDER_SITE_OTHER): Payer: PRIVATE HEALTH INSURANCE | Admitting: Family Medicine

## 2012-12-19 VITALS — BP 162/79 | HR 81 | Temp 97.4°F

## 2012-12-19 DIAGNOSIS — N139 Obstructive and reflux uropathy, unspecified: Secondary | ICD-10-CM

## 2012-12-19 DIAGNOSIS — N390 Urinary tract infection, site not specified: Secondary | ICD-10-CM

## 2012-12-19 LAB — POCT URINALYSIS DIPSTICK
Bilirubin, UA: NEGATIVE
Glucose, UA: NEGATIVE
Ketones, UA: NEGATIVE
Nitrite, UA: POSITIVE
Spec Grav, UA: 1.015
Urobilinogen, UA: NEGATIVE
pH, UA: 8

## 2012-12-19 LAB — POCT CBC
Granulocyte percent: 80.5 %G — AB (ref 37–80)
HCT, POC: 38.1 % — AB (ref 43.5–53.7)
Hemoglobin: 12.6 g/dL — AB (ref 14.1–18.1)
Lymph, poc: 1.3 (ref 0.6–3.4)
MCH, POC: 29.3 pg (ref 27–31.2)
MCHC: 33.1 g/dL (ref 31.8–35.4)
MCV: 88.4 fL (ref 80–97)
MPV: 6.6 fL (ref 0–99.8)
POC Granulocyte: 8.4 — AB (ref 2–6.9)
POC LYMPH PERCENT: 12.3 %L (ref 10–50)
Platelet Count, POC: 373 10*3/uL (ref 142–424)
RBC: 4.3 M/uL — AB (ref 4.69–6.13)
RDW, POC: 13.4 %
WBC: 10.4 10*3/uL — AB (ref 4.6–10.2)

## 2012-12-19 LAB — POCT UA - MICROSCOPIC ONLY
Casts, Ur, LPF, POC: NEGATIVE
Epithelial cells, urine per micros: NEGATIVE

## 2012-12-19 NOTE — Patient Instructions (Signed)
Urinary Tract Infection  Urinary tract infections (UTIs) can develop anywhere along your urinary tract. Your urinary tract is your body's drainage system for removing wastes and extra water. Your urinary tract includes two kidneys, two ureters, a bladder, and a urethra. Your kidneys are a pair of bean-shaped organs. Each kidney is about the size of your fist. They are located below your ribs, one on each side of your spine.  CAUSES  Infections are caused by microbes, which are microscopic organisms, including fungi, viruses, and bacteria. These organisms are so small that they can only be seen through a microscope. Bacteria are the microbes that most commonly cause UTIs.  SYMPTOMS   Symptoms of UTIs may vary by age and gender of the patient and by the location of the infection. Symptoms in young women typically include a frequent and intense urge to urinate and a painful, burning feeling in the bladder or urethra during urination. Older women and men are more likely to be tired, shaky, and weak and have muscle aches and abdominal pain. A fever may mean the infection is in your kidneys. Other symptoms of a kidney infection include pain in your back or sides below the ribs, nausea, and vomiting.  DIAGNOSIS  To diagnose a UTI, your caregiver will ask you about your symptoms. Your caregiver also will ask to provide a urine sample. The urine sample will be tested for bacteria and white blood cells. White blood cells are made by your body to help fight infection.  TREATMENT   Typically, UTIs can be treated with medication. Because most UTIs are caused by a bacterial infection, they usually can be treated with the use of antibiotics. The choice of antibiotic and length of treatment depend on your symptoms and the type of bacteria causing your infection.  HOME CARE INSTRUCTIONS   If you were prescribed antibiotics, take them exactly as your caregiver instructs you. Finish the medication even if you feel better after you  have only taken some of the medication.   Drink enough water and fluids to keep your urine clear or pale yellow.   Avoid caffeine, tea, and carbonated beverages. They tend to irritate your bladder.   Empty your bladder often. Avoid holding urine for long periods of time.   Empty your bladder before and after sexual intercourse.   After a bowel movement, women should cleanse from front to back. Use each tissue only once.  SEEK MEDICAL CARE IF:    You have back pain.   You develop a fever.   Your symptoms do not begin to resolve within 3 days.  SEEK IMMEDIATE MEDICAL CARE IF:    You have severe back pain or lower abdominal pain.   You develop chills.   You have nausea or vomiting.   You have continued burning or discomfort with urination.  MAKE SURE YOU:    Understand these instructions.   Will watch your condition.   Will get help right away if you are not doing well or get worse.  Document Released: 02/14/2005 Document Revised: 11/06/2011 Document Reviewed: 06/15/2011  [employer] Patient Information 2014 [employer]

## 2012-12-19 NOTE — Progress Notes (Signed)
  Subjective:    Patient ID: Charles Gibbs, male    DOB: Nov 25, 1927, 79 y.o.   MRN: [id number]  HPI  This 79 y.o. male presents for evaluation of UTI.  He has hx of bph and has urinary retention And was to undergo a TURP by his urologist but became weak and was taken to the ED where  He had an indwelling foley cath placed and was dx with UTI.  He has been on cipro abx for 4 days And he is feeling better.  He is eating and drinking.  He is in a wheelchair and is accompanied by His daughter who states he could be doing better at drinking fluids.  He has straw colored urine in his Catheter tubing.    Review of Systems No chest pain, SOB, HA, dizziness, vision change, N/V, diarrhea, constipation, dysuria, urinary urgency or frequency, myalgias, arthralgias or rash.     Objective:   Physical Exam  Vital signs noted  Chronically ill appearing male in wheelchair in NAD.  HEENT - Head atraumatic Normocephalic                Eyes - PERRLA, Conjuctiva - clear Sclera- Clear EOMI                Ears - EAC's Wnl TM's Wnl Gross Hearing WNL                Throat - oropharanx wnl Respiratory - Lungs CTA bilateral Cardiac - RRR S1 and S2 without murmur GI - Abdomen soft Nontender and bowel sounds active x 4. GU - Indwelling foley catheter connected to BSB with yellow urine draining.      Assessment & Plan:  Urinary tract infection, site not specified - Plan: POCT UA - Microscopic Only, POCT urinalysis dipstick, POCT CBC, BMP8+EGFR, Urine culture, push po fluids, rest.  Looks like he is doing better and recommend he continue with care at home And follow up prn if declining.  Discussed with his daughter that he will need the foley cath until he is seen by his urologist. Will call Monday with labs and discuss getting follow up appointment with urology and getting TURP procedure when he Is a little stronger.  Uropathy, obstructive - Plan: POCT UA - Microscopic Only, POCT urinalysis dipstick, POCT  CBC, BMP8+EGFR, Urine culture

## 2012-12-20 LAB — BMP8+EGFR
BUN/Creatinine Ratio: 27 — ABNORMAL HIGH (ref 10–22)
BUN: 31 mg/dL — ABNORMAL HIGH (ref 8–27)
CO2: 23 mmol/L (ref 18–29)
Calcium: 8.1 mg/dL — ABNORMAL LOW (ref 8.6–10.2)
Chloride: 104 mmol/L (ref 97–108)
Creatinine, Ser: 1.15 mg/dL (ref 0.76–1.27)
GFR calc Af Amer: 68 mL/min/{1.73_m2} (ref >59)
GFR calc non Af Amer: 59 mL/min/{1.73_m2} — ABNORMAL LOW (ref >59)
Glucose: 161 mg/dL — ABNORMAL HIGH (ref 65–99)
Potassium: 4.4 mmol/L (ref 3.5–5.2)
Sodium: 144 mmol/L (ref 134–144)

## 2012-12-20 LAB — URINE CULTURE: Organism ID, Bacteria: NO GROWTH

## 2012-12-24 ENCOUNTER — Telehealth: Payer: Self-pay | Admitting: Family Medicine

## 2012-12-26 ENCOUNTER — Emergency Department (HOSPITAL_COMMUNITY): Payer: PRIVATE HEALTH INSURANCE

## 2012-12-26 ENCOUNTER — Inpatient Hospital Stay (HOSPITAL_COMMUNITY)
Admission: EM | Admit: 2012-12-26 | Discharge: 2013-01-06 | DRG: 690 | Disposition: A | Discharge status: Home Health | Payer: PRIVATE HEALTH INSURANCE | Attending: Internal Medicine | Admitting: Internal Medicine

## 2012-12-26 ENCOUNTER — Encounter (HOSPITAL_COMMUNITY): Payer: Self-pay | Admitting: *Deleted

## 2012-12-26 DIAGNOSIS — IMO0002 Reserved for concepts with insufficient information to code with codable children: Secondary | ICD-10-CM | POA: Diagnosis present

## 2012-12-26 DIAGNOSIS — N211 Calculus in urethra: Secondary | ICD-10-CM | POA: Diagnosis present

## 2012-12-26 DIAGNOSIS — R310 Gross hematuria: Secondary | ICD-10-CM | POA: Diagnosis present

## 2012-12-26 DIAGNOSIS — N179 Acute kidney failure, unspecified: Secondary | ICD-10-CM | POA: Diagnosis present

## 2012-12-26 DIAGNOSIS — E872 Acidosis, unspecified: Secondary | ICD-10-CM | POA: Diagnosis present

## 2012-12-26 DIAGNOSIS — A498 Other bacterial infections of unspecified site: Secondary | ICD-10-CM | POA: Diagnosis present

## 2012-12-26 DIAGNOSIS — C61 Malignant neoplasm of prostate: Secondary | ICD-10-CM | POA: Diagnosis present

## 2012-12-26 DIAGNOSIS — Z823 Family history of stroke: Secondary | ICD-10-CM

## 2012-12-26 DIAGNOSIS — D72829 Elevated white blood cell count, unspecified: Secondary | ICD-10-CM | POA: Diagnosis present

## 2012-12-26 DIAGNOSIS — Z87440 Personal history of urinary (tract) infections: Secondary | ICD-10-CM

## 2012-12-26 DIAGNOSIS — N390 Urinary tract infection, site not specified: Principal | ICD-10-CM

## 2012-12-26 DIAGNOSIS — W19XXXA Unspecified fall, initial encounter: Secondary | ICD-10-CM

## 2012-12-26 DIAGNOSIS — W19XXXD Unspecified fall, subsequent encounter: Secondary | ICD-10-CM

## 2012-12-26 DIAGNOSIS — R339 Retention of urine, unspecified: Secondary | ICD-10-CM | POA: Diagnosis present

## 2012-12-26 DIAGNOSIS — E1165 Type 2 diabetes mellitus with hyperglycemia: Secondary | ICD-10-CM

## 2012-12-26 DIAGNOSIS — Z79899 Other long term (current) drug therapy: Secondary | ICD-10-CM

## 2012-12-26 DIAGNOSIS — E8729 Other acidosis: Secondary | ICD-10-CM | POA: Diagnosis not present

## 2012-12-26 DIAGNOSIS — E860 Dehydration: Secondary | ICD-10-CM | POA: Diagnosis present

## 2012-12-26 DIAGNOSIS — Y92009 Unspecified place in unspecified non-institutional (private) residence as the place of occurrence of the external cause: Secondary | ICD-10-CM

## 2012-12-26 DIAGNOSIS — Z8249 Family history of ischemic heart disease and other diseases of the circulatory system: Secondary | ICD-10-CM

## 2012-12-26 DIAGNOSIS — I10 Essential (primary) hypertension: Secondary | ICD-10-CM | POA: Diagnosis present

## 2012-12-26 DIAGNOSIS — D649 Anemia, unspecified: Secondary | ICD-10-CM | POA: Diagnosis not present

## 2012-12-26 DIAGNOSIS — R319 Hematuria, unspecified: Secondary | ICD-10-CM

## 2012-12-26 DIAGNOSIS — E876 Hypokalemia: Secondary | ICD-10-CM | POA: Diagnosis present

## 2012-12-26 DIAGNOSIS — R531 Weakness: Secondary | ICD-10-CM

## 2012-12-26 DIAGNOSIS — IMO0001 Reserved for inherently not codable concepts without codable children: Secondary | ICD-10-CM

## 2012-12-26 LAB — URINALYSIS, ROUTINE W REFLEX MICROSCOPIC
Bilirubin Urine: NEGATIVE
Nitrite: NEGATIVE
Protein, ur: 100 mg/dL — AB
Urobilinogen, UA: 0.2 mg/dL (ref 0.0–1.0)
pH: 5 (ref 5.0–8.0)

## 2012-12-26 LAB — CBC WITH DIFFERENTIAL/PLATELET
Basophils Relative: 0 % (ref 0–1)
Eosinophils Absolute: 0.1 10*3/uL (ref 0.0–0.7)
Eosinophils Relative: 1 % (ref 0–5)
HCT: 38.9 % — ABNORMAL LOW (ref 39.0–52.0)
Hemoglobin: 12.9 g/dL — ABNORMAL LOW (ref 13.0–17.0)
MCH: 29.9 pg (ref 26.0–34.0)
MCHC: 33.2 g/dL (ref 30.0–36.0)
Monocytes Absolute: 0.6 10*3/uL (ref 0.1–1.0)
Neutro Abs: 10.4 10*3/uL — ABNORMAL HIGH (ref 1.7–7.7)

## 2012-12-26 LAB — COMPREHENSIVE METABOLIC PANEL
Alkaline Phosphatase: 54 U/L (ref 39–117)
BUN: 37 mg/dL — ABNORMAL HIGH (ref 6–23)
CO2: 23 mEq/L (ref 19–32)
GFR calc Af Amer: 53 mL/min — ABNORMAL LOW (ref >90)
GFR calc non Af Amer: 46 mL/min — ABNORMAL LOW (ref >90)
Glucose, Bld: 141 mg/dL — ABNORMAL HIGH (ref 70–99)
Potassium: 4 mEq/L (ref 3.5–5.1)
Total Protein: 6.3 g/dL (ref 6.0–8.3)

## 2012-12-26 LAB — GLUCOSE, CAPILLARY
Glucose-Capillary: 136 mg/dL — ABNORMAL HIGH (ref 70–99)
Glucose-Capillary: 92 mg/dL (ref 70–99)

## 2012-12-26 LAB — URINE MICROSCOPIC-ADD ON

## 2012-12-26 MED ORDER — INSULIN ASPART 100 UNIT/ML ~~LOC~~ SOLN
0.0000 [IU] | Freq: Every day | SUBCUTANEOUS | Status: DC
  Administered 2013-01-02 – 2013-01-05 (×2): 2 [IU] via SUBCUTANEOUS

## 2012-12-26 MED ORDER — LISINOPRIL 10 MG PO TABS
10.0000 mg | ORAL_TABLET | Freq: Every day | ORAL | Status: DC
  Administered 2012-12-27 – 2013-01-02 (×7): 10 mg via ORAL
  Filled 2012-12-26 (×7): qty 1

## 2012-12-26 MED ORDER — DEXTROSE 5 % IV SOLN
1.0000 g | INTRAVENOUS | Status: DC
  Filled 2012-12-26 (×2): qty 10

## 2012-12-26 MED ORDER — ONDANSETRON HCL 4 MG/2ML IJ SOLN
4.0000 mg | Freq: Four times a day (QID) | INTRAMUSCULAR | Status: DC | PRN
  Administered 2012-12-30 – 2013-01-01 (×3): 4 mg via INTRAVENOUS
  Filled 2012-12-26 (×3): qty 2

## 2012-12-26 MED ORDER — HEPARIN SODIUM (PORCINE) 5000 UNIT/ML IJ SOLN
5000.0000 [IU] | Freq: Three times a day (TID) | INTRAMUSCULAR | Status: DC
Start: 2012-12-26 — End: 2012-12-31
  Administered 2012-12-26 – 2012-12-30 (×12): 5000 [IU] via SUBCUTANEOUS
  Filled 2012-12-26 (×13): qty 1

## 2012-12-26 MED ORDER — DEXTROSE 5 % IV SOLN
1.0000 g | Freq: Once | INTRAVENOUS | Status: AC
  Administered 2012-12-26: 1 g via INTRAVENOUS
  Filled 2012-12-26: qty 10

## 2012-12-26 MED ORDER — CEFTRIAXONE SODIUM 1 G IJ SOLR
1.0000 g | INTRAMUSCULAR | Status: DC
  Administered 2012-12-26 – 2012-12-30 (×5): 1 g via INTRAVENOUS
  Filled 2012-12-26 (×6): qty 10

## 2012-12-26 MED ORDER — ONDANSETRON HCL 4 MG PO TABS: 4.0000 mg | ORAL_TABLET | Freq: Four times a day (QID) | ORAL | Status: DC | PRN

## 2012-12-26 MED ORDER — SODIUM CHLORIDE 0.9 % IV SOLN
INTRAVENOUS | Status: DC
  Administered 2012-12-26 – 2012-12-28 (×3): via INTRAVENOUS

## 2012-12-26 MED ORDER — INSULIN ASPART 100 UNIT/ML ~~LOC~~ SOLN
0.0000 [IU] | Freq: Three times a day (TID) | SUBCUTANEOUS | Status: DC
  Administered 2012-12-27 (×2): 2 [IU] via SUBCUTANEOUS
  Administered 2012-12-28: 5 [IU] via SUBCUTANEOUS
  Administered 2012-12-28: 1 [IU] via SUBCUTANEOUS
  Administered 2012-12-29 (×3): 3 [IU] via SUBCUTANEOUS
  Administered 2012-12-30 (×2): 2 [IU] via SUBCUTANEOUS
  Administered 2012-12-31: 8 [IU] via SUBCUTANEOUS
  Administered 2012-12-31 (×2): 2 [IU] via SUBCUTANEOUS
  Administered 2013-01-01 – 2013-01-02 (×2): 3 [IU] via SUBCUTANEOUS
  Administered 2013-01-02 – 2013-01-03 (×3): 2 [IU] via SUBCUTANEOUS
  Administered 2013-01-03: 3 [IU] via SUBCUTANEOUS
  Administered 2013-01-04 – 2013-01-05 (×3): 5 [IU] via SUBCUTANEOUS
  Administered 2013-01-05: 8 [IU] via SUBCUTANEOUS

## 2012-12-26 MED ORDER — HEPARIN SODIUM (PORCINE) 5000 UNIT/ML IJ SOLN: 5000.0000 [IU] | Freq: Three times a day (TID) | INTRAMUSCULAR | Status: DC

## 2012-12-26 MED ORDER — DEXTROSE 5 % IV SOLN: 1.0000 g | INTRAVENOUS | Status: DC

## 2012-12-26 MED ORDER — MECLIZINE HCL 12.5 MG PO TABS
25.0000 mg | ORAL_TABLET | Freq: Three times a day (TID) | ORAL | Status: DC | PRN
  Administered 2013-01-01: 25 mg via ORAL
  Filled 2012-12-26: qty 2

## 2012-12-26 MED ORDER — ASPIRIN 81 MG PO CHEW
81.0000 mg | CHEWABLE_TABLET | Freq: Every morning | ORAL | Status: DC
  Administered 2012-12-27 – 2012-12-30 (×4): 81 mg via ORAL
  Filled 2012-12-26 (×5): qty 1

## 2012-12-26 MED ORDER — ATORVASTATIN CALCIUM 10 MG PO TABS
10.0000 mg | ORAL_TABLET | Freq: Every day | ORAL | Status: DC
  Administered 2012-12-27 – 2013-01-05 (×10): 10 mg via ORAL
  Filled 2012-12-26 (×12): qty 1

## 2012-12-26 MED ORDER — DEXTROSE 5 % IV SOLN
INTRAVENOUS | Status: AC
  Filled 2012-12-26: qty 10

## 2012-12-26 NOTE — ED Notes (Addendum)
Foley cath present prior to arrival, from home. Drainage in foley bag is turbid and tan in color.

## 2012-12-26 NOTE — ED Provider Notes (Signed)
CSN: [id number]     Arrival date & time 12/26/12  1223 History  This chart was scribed for Shelda Jakes, MD by Bennett Scrape, ED Scribe. This patient was seen in room APA12/APA12 and the patient's care was started at 1:48 PM.   Chief Complaint  Patient presents with  . Fatigue    The history is provided by the patient and a relative. No language interpreter was used.    HPI Comments: Charles Gibbs is a 79 y.o. male who presents to the Emergency Department complaining of generalized weakness worse than baseline noted today by family. Family states that they took the pt to the bathroom and upon standing, he kept fall backwards. Upon EMS arrival, family states that the pt c/o CP.  He has a foley cath in place but family states that it has been present for the past 3 weeks for urinary retention from enlarged prostate. He is currently on Cipro for an UTI started on July 29th, 2014, but family reports cloudy urine in bag today. They deny the pt having any symptoms yesterday. Pt reports mild nausea but denies having any dizziness, abdominal pain, SOB, fever or CP currently.  PCP is Dr. Christell Constant. Dr. Jerre Simon is Urologist Lives alone. Has a health nurse that comes every other day. Family helps daily.   Past Medical History  Diagnosis Date  . Diabetes mellitus   . Inner ear inflammation   . Vertigo   . Urinary retention 12/04/2012  . Bladder stone 12/09/2012    S/p CYSTOSCOPY WITH LITHOLAPAXY (N/A)  . Hypertension    Past Surgical History  Procedure Laterality Date  . Cystoscopy N/A 12/08/2012    Procedure: CYSTOSCOPY FLEXIBLE;  Surgeon: Ky Barban, MD;  Location: [facility];  Service: Urology;  Laterality: N/A;  . Cystoscopy with litholapaxy N/A 12/09/2012    Procedure: CYSTOSCOPY WITH LITHOLAPAXY;  Surgeon: Ky Barban, MD;  Location: [facility];  Service: Urology;  Laterality: N/A;  . Holmium laser application N/A 12/09/2012    Procedure: HOLMIUM LASER APPLICATION;  Surgeon:  Ky Barban, MD;  Location: [facility];  Service: Urology;  Laterality: N/A;   Family History  Problem Relation Age of Onset  . Stroke Mother   . Cancer Mother   . Heart failure Mother   . Heart failure Father    History  Substance Use Topics  . Smoking status: Never Smoker   . Smokeless tobacco: Never Used  . Alcohol Use: No    Review of Systems  Constitutional: Negative for fever and chills.  HENT: Negative for congestion, sore throat and neck pain.   Eyes: Negative for visual disturbance.  Respiratory: Negative for cough and shortness of breath.   Cardiovascular: Negative for chest pain and leg swelling.  Gastrointestinal: Positive for nausea. Negative for vomiting, abdominal pain and diarrhea.  Genitourinary: Negative for dysuria.  Musculoskeletal: Negative for back pain.  Skin: Negative for rash.  Neurological: Positive for weakness. Negative for dizziness and headaches.  Hematological: Does not bruise/bleed easily.  Psychiatric/Behavioral: Negative for confusion.  All other systems reviewed and are negative.    Allergies  Review of patient's allergies indicates no known allergies.  Home Medications   Current Outpatient Rx  Name  Route  Sig  Dispense  Refill  . aspirin 81 MG chewable tablet   Oral   Chew 81 mg by mouth every morning.          Marland Kitchen atorvastatin (LIPITOR) 10 MG tablet   Oral  Take 10 mg by mouth every morning.         . ciprofloxacin (CIPRO) 500 MG tablet   Oral   Take 500 mg by mouth 2 (two) times daily.          Marland Kitchen lisinopril (PRINIVIL,ZESTRIL) 10 MG tablet   Oral   Take 1 tablet (10 mg total) by mouth daily.   30 tablet   0   . meclizine (ANTIVERT) 25 MG tablet   Oral   Take 25 mg by mouth 3 (three) times daily as needed.         . metFORMIN (GLUCOPHAGE) 500 MG tablet   Oral   Take 1 tablet (500 mg total) by mouth 2 (two) times daily with a meal.   60 tablet   0   . Multiple Vitamins-Minerals (ONE-A-DAY 50 PLUS) TABS    Oral   Take 1 tablet by mouth at bedtime.          Triage Vitals: BP 159/77  Pulse 73  Temp(Src) 97.8 F (36.6 C) (Oral)  Resp 16  Ht 5\' 6"  (1.676 m)  Wt 131 lb (59.421 kg)  BMI 21.15 kg/m2  SpO2 100%  Physical Exam  Nursing note and vitals reviewed. Constitutional: He is oriented to person, place, and time. He appears well-developed and well-nourished. No distress.  HENT:  Head: Normocephalic and atraumatic.  Mouth/Throat: Oropharynx is clear and moist.  Eyes: Conjunctivae and EOM are normal. Pupils are equal, round, and reactive to light.  Sclera are clear  Neck: Neck supple. No tracheal deviation present.  Cardiovascular: Normal rate and regular rhythm.   No murmur heard. Pulses:      Dorsalis pedis pulses are 2+ on the right side, and 2+ on the left side.  Pulmonary/Chest: Effort normal and breath sounds normal. No respiratory distress. He has no wheezes.  Abdominal: Soft. Bowel sounds are normal. He exhibits no distension. There is no tenderness.  Genitourinary:  Foley in place with cloudy thick urine present  Musculoskeletal: Normal range of motion. He exhibits no edema (no ankle swelling).  Lymphadenopathy:    He has no cervical adenopathy.  Neurological: He is alert and oriented to person, place, and time. No cranial nerve deficit.  Pt able to move both sets of fingers and toes  Skin: Skin is warm and dry. No rash noted.  Psychiatric: He has a normal mood and affect. His behavior is normal.    ED Course   Procedures (including critical care time)  Medications  0.9 %  sodium chloride infusion ( Intravenous New Bag/Given 12/26/12 1453)  cefTRIAXone (ROCEPHIN) 1 g in dextrose 5 % 50 mL IVPB (1 g Intravenous New Bag/Given 12/26/12 1732)  aspirin chewable tablet 81 mg (not administered)  atorvastatin (LIPITOR) tablet 10 mg (not administered)  meclizine (ANTIVERT) tablet 25 mg (not administered)  lisinopril (PRINIVIL,ZESTRIL) tablet 10 mg (not administered)  heparin  injection 5,000 Units (not administered)  ondansetron (ZOFRAN) tablet 4 mg (not administered)    Or  ondansetron (ZOFRAN) injection 4 mg (not administered)  cefTRIAXone (ROCEPHIN) 1 g in dextrose 5 % 50 mL IVPB (not administered)    DIAGNOSTIC STUDIES: Oxygen Saturation is 100% on room air, normal by my interpretation.    COORDINATION OF CARE: 1:51 PM-Discussed treatment plan which includes CXR, CBC panel, CMP and UA with pt at bedside and pt agreed to plan.  2:42 PM-Per home health care nurse, she reports unlivable conditions with cock roches. Pt was in bed all day yesterday  and did not want to get up or eat. She reports that the family does not take care of the pt as much as they report.  Labs Reviewed  CBC WITH DIFFERENTIAL - Abnormal; Notable for the following:    WBC 12.6 (*)    Hemoglobin 12.9 (*)    HCT 38.9 (*)    Neutrophils Relative % 82 (*)    Neutro Abs 10.4 (*)    All other components within normal limits  COMPREHENSIVE METABOLIC PANEL - Abnormal; Notable for the following:    Glucose, Bld 141 (*)    BUN 37 (*)    Creatinine, Ser 1.38 (*)    Albumin 2.6 (*)    GFR calc non Af Amer 46 (*)    GFR calc Af Amer 53 (*)    All other components within normal limits  URINALYSIS, ROUTINE W REFLEX MICROSCOPIC - Abnormal; Notable for the following:    Specific Gravity, Urine >1.030 (*)    Hgb urine dipstick LARGE (*)    Ketones, ur TRACE (*)    Protein, ur 100 (*)    Leukocytes, UA TRACE (*)    All other components within normal limits  URINE MICROSCOPIC-ADD ON - Abnormal; Notable for the following:    Bacteria, UA FEW (*)    All other components within normal limits  URINE CULTURE  CBC  CREATININE, SERUM  COMPREHENSIVE METABOLIC PANEL  CBC   Dg Chest Port 1 View  12/26/2012   *RADIOLOGY REPORT*  Clinical Data: Fatigue  PORTABLE CHEST - 1 VIEW  Comparison: 12/03/2012  Findings: Cardiomediastinal silhouette is stable.  No pulmonary edema.  Question trace left pleural  effusion with left basilar atelectasis.  No segmental infiltrate.  IMPRESSION: Question trace left pleural effusion with left basilar atelectasis. No segmental infiltrate.  No pulmonary edema.   Original Report Authenticated By: Natasha Mead, M.D.    Results for orders placed during the hospital encounter of 12/26/12  CBC WITH DIFFERENTIAL      Result Value Range   WBC 12.6 (*) 4.0 - 10.5 K/uL   RBC 4.32  4.22 - 5.81 MIL/uL   Hemoglobin 12.9 (*) 13.0 - 17.0 g/dL   HCT 16.1 (*) 09.6 - 04.5 %   MCV 90.0  78.0 - 100.0 fL   MCH 29.9  26.0 - 34.0 pg   MCHC 33.2  30.0 - 36.0 g/dL   RDW 40.9  81.1 - 91.4 %   Platelets 325  150 - 400 K/uL   Neutrophils Relative % 82 (*) 43 - 77 %   Lymphocytes Relative 12  12 - 46 %   Monocytes Relative 5  3 - 12 %   Eosinophils Relative 1  0 - 5 %   Basophils Relative 0  0 - 1 %   Neutro Abs 10.4 (*) 1.7 - 7.7 K/uL   Lymphs Abs 1.5  0.7 - 4.0 K/uL   Monocytes Absolute 0.6  0.1 - 1.0 K/uL   Eosinophils Absolute 0.1  0.0 - 0.7 K/uL   Basophils Absolute 0.0  0.0 - 0.1 K/uL   WBC Morphology INCREASED BANDS (>20% BANDS)    COMPREHENSIVE METABOLIC PANEL      Result Value Range   Sodium 140  135 - 145 mEq/L   Potassium 4.0  3.5 - 5.1 mEq/L   Chloride 105  96 - 112 mEq/L   CO2 23  19 - 32 mEq/L   Glucose, Bld 141 (*) 70 - 99 mg/dL   BUN 37 (*)  6 - 23 mg/dL   Creatinine, Ser 1.91 (*) 0.50 - 1.35 mg/dL   Calcium 8.4  8.4 - 47.8 mg/dL   Total Protein 6.3  6.0 - 8.3 g/dL   Albumin 2.6 (*) 3.5 - 5.2 g/dL   AST 14  0 - 37 U/L   ALT 6  0 - 53 U/L   Alkaline Phosphatase 54  39 - 117 U/L   Total Bilirubin 0.4  0.3 - 1.2 mg/dL   GFR calc non Af Amer 46 (*) >90 mL/min   GFR calc Af Amer 53 (*) >90 mL/min  URINALYSIS, ROUTINE W REFLEX MICROSCOPIC      Result Value Range   Color, Urine YELLOW  YELLOW   APPearance CLEAR  CLEAR   Specific Gravity, Urine >1.030 (*) 1.005 - 1.030   pH 5.0  5.0 - 8.0   Glucose, UA NEGATIVE  NEGATIVE mg/dL   Hgb urine dipstick LARGE  (*) NEGATIVE   Bilirubin Urine NEGATIVE  NEGATIVE   Ketones, ur TRACE (*) NEGATIVE mg/dL   Protein, ur 295 (*) NEGATIVE mg/dL   Urobilinogen, UA 0.2  0.0 - 1.0 mg/dL   Nitrite NEGATIVE  NEGATIVE   Leukocytes, UA TRACE (*) NEGATIVE  URINE MICROSCOPIC-ADD ON      Result Value Range   WBC, UA TOO NUMEROUS TO COUNT  <3 WBC/hpf   RBC / HPF 11-20  <3 RBC/hpf   Bacteria, UA FEW (*) RARE    Date: 12/26/2012  Rate: 72  Rhythm: normal sinus rhythm  QRS Axis: normal  Intervals: normal  ST/T Wave abnormalities: normal  Conduction Disutrbances:none  Narrative Interpretation:   Old EKG Reviewed: unchanged No sniffing change in EKG compared to 12/03/2012 patient had some artifacts on that EKG which have resolved.    1. UTI (lower urinary tract infection)   2. Weakness     MDM   Patient's urine almost purulent in nature I suspect that symptoms are all related to a significant urinary tract infection perhaps prostatitis. Patient's been on Cipro started on Rocephin here urine culture pending. Lab workup other than the urinary tract infection and a mild leukocytosis without significant abnormalities creatinine is slightly elevated at 1.38 but not significant no evidence of acidosis. Patient will be admitted by hospitalist team they're seeing him now also we get case manager involved reports from home situation sounds as if the home while was infested with broaches and then not a very good sanitary environment. Patient has had several visits since   I developing urinary retention requiring a Foley catheter. This also followed by Dr. Jesse Fall from urology who is hoping to do a TURP procedure once the infections are cleared.     I personally performed the services described in this documentation, which was scribed in my presence. The recorded information has been reviewed and is accurate.       Shelda Jakes, MD 12/26/12 [phone]

## 2012-12-26 NOTE — ED Notes (Signed)
Attempted to call report to 3A, awaiting return call from [employer].

## 2012-12-26 NOTE — H&P (Signed)
[facility] History and Physical  Charles Gibbs:[id number] DOB: Nov 08, 1927 DOA: 12/26/2012  Referring physician: ER. PCP: Rudi Heap, MD  Specialists: Dr. Jerre Simon, urology.  Chief Complaint: Weakness.  HPI: Charles Gibbs is a 79 y.o. male who is well-known to the hospital and presents with generalized weakness, there is a history of a possible fall. He was evaluated in the emergency room and was found to be dehydrated and appears to have another UTI. He has a Foley catheter in situ. He is due to have TURP by Dr. Jerre Simon, urology in the near future. There is concern regarding care at home and skilled nursing facility was offered previously. He refuses to go to a nursing home. He apparently has advanced home care coming to his home. He denies fever.   Review of Systems:  Apart from history of present illness, other systems negative.  Past Medical History  Diagnosis Date  . Diabetes mellitus   . Inner ear inflammation   . Vertigo   . Urinary retention 12/04/2012  . Bladder stone 12/09/2012    S/p CYSTOSCOPY WITH LITHOLAPAXY (N/A)  . Hypertension    Past Surgical History  Procedure Laterality Date  . Cystoscopy N/A 12/08/2012    Procedure: CYSTOSCOPY FLEXIBLE;  Surgeon: Ky Barban, MD;  Location: [facility];  Service: Urology;  Laterality: N/A;  . Cystoscopy with litholapaxy N/A 12/09/2012    Procedure: CYSTOSCOPY WITH LITHOLAPAXY;  Surgeon: Ky Barban, MD;  Location: [facility];  Service: Urology;  Laterality: N/A;  . Holmium laser application N/A 12/09/2012    Procedure: HOLMIUM LASER APPLICATION;  Surgeon: Ky Barban, MD;  Location: [facility];  Service: Urology;  Laterality: N/A;   Social History:  reports that he has never smoked. He has never used smokeless tobacco. He reports that he does not drink alcohol or use illicit drugs.   No Known Allergies  Family History  Problem Relation Age of Onset  . Stroke Mother   . Cancer Mother   . Heart failure Mother    . Heart failure Father       Prior to Admission medications   Medication Sig Start Date End Date Taking? Authorizing Provider  aspirin 81 MG chewable tablet Chew 81 mg by mouth every morning.    Yes Historical Provider, MD  atorvastatin (LIPITOR) 10 MG tablet Take 10 mg by mouth every morning.   Yes Historical Provider, MD  ciprofloxacin (CIPRO) 500 MG tablet Take 500 mg by mouth 2 (two) times daily.  12/16/12  Yes Historical Provider, MD  lisinopril (PRINIVIL,ZESTRIL) 10 MG tablet Take 1 tablet (10 mg total) by mouth daily. 12/12/12  Yes Standley Brooking, MD  meclizine (ANTIVERT) 25 MG tablet Take 25 mg by mouth 3 (three) times daily as needed.   Yes Historical Provider, MD  metFORMIN (GLUCOPHAGE) 500 MG tablet Take 1 tablet (500 mg total) by mouth 2 (two) times daily with a meal. 12/12/12  Yes Standley Brooking, MD  Multiple Vitamins-Minerals (ONE-A-DAY 50 PLUS) TABS Take 1 tablet by mouth at bedtime.   Yes Historical Provider, MD   Physical Exam: Filed Vitals:   12/26/12 1700  BP: 155/78  Pulse: 70  Temp:   Resp: 18     General:  He looks clinically dehydrated. Thankfully, he does not look toxic or septic.  Eyes: No pallor. No jaundice.  ENT: No abnormalities.  Neck: No lymphadenopathy.  Cardiovascular: Heart sounds are present and in sinus rhythm. There are no murmurs. No gallop  rhythm.  Respiratory: Lung fields are clear.  Abdomen: Soft, nontender. No masses felt.  Skin: No rash.  Musculoskeletal: No acute joint abnormalities.  Psychiatric: Appropriate affect.  Neurologic: Alert and orientated without any focal neurological signs.  Labs on Admission:  Basic Metabolic Panel:  Recent Labs Lab 12/26/12 1438  NA 140  K 4.0  CL 105  CO2 23  GLUCOSE 141*  BUN 37*  CREATININE 1.38*  CALCIUM 8.4   Liver Function Tests:  Recent Labs Lab 12/26/12 1438  AST 14  ALT 6  ALKPHOS 54  BILITOT 0.4  PROT 6.3  ALBUMIN 2.6*     CBC:  Recent Labs Lab  12/26/12 1438  WBC 12.6*  NEUTROABS 10.4*  HGB 12.9*  HCT 38.9*  MCV 90.0  PLT 325         Radiological Exams on Admission: Dg Chest Port 1 View  12/26/2012   *RADIOLOGY REPORT*  Clinical Data: Fatigue  PORTABLE CHEST - 1 VIEW  Comparison: 12/03/2012  Findings: Cardiomediastinal silhouette is stable.  No pulmonary edema.  Question trace left pleural effusion with left basilar atelectasis.  No segmental infiltrate.  IMPRESSION: Question trace left pleural effusion with left basilar atelectasis. No segmental infiltrate.  No pulmonary edema.   Original Report Authenticated By: Natasha Mead, M.D.    EKG: Independently reviewed. Normal sinus rhythm, no acute ST-T wave changes.  Assessment/Plan   1. Urinary tract infection. 2. Dehydration. 3. Fall at home secondary to #1 and #2. 4. Urinary retention, status post Foley catheter in situ. Awaiting TURP. 5. Type 2 diabetes mellitus, currently controlled.  Plan: 1. Admit to medical floor. 2. Intravenous fluids. 3. Intravenous antibiotics. 4. Diabetic control. 5. Case manager input regarding disposition.   Code Status: Full code.   Family Communication: Discussed plan with patient and family at the bedside.  Disposition Plan: Depending on progress.   Time spent: 30 minutes.  Wilson Singer [facility] Pager [phone]  If 7PM-7AM, please contact night-coverage [email or web address] Password [facility] 12/26/2012, 5:38 PM

## 2012-12-26 NOTE — ED Notes (Signed)
Patient already had a foley cath in place.  At doctor's request - we took this one out and replaced.

## 2012-12-26 NOTE — ED Notes (Signed)
Generalized weakness, currently being treated for uti, on Cipro, prescribed 12/16/2012.

## 2012-12-26 NOTE — Progress Notes (Signed)
CARE MANAGEMENT ED NOTE 12/26/2012  Patient:  TRAVARIUS, LANGE   Account Number:  [id number]  Date Initiated:  12/26/2012  Documentation initiated by:  Anibal Henderson  Subjective/Objective Assessment:     Subjective/Objective Assessment Detail:   [facility] RN called ED to say pt is living in very poor conditions, and that family is not doing as much as they say they are. Spoke with pt and daughter and granddaughter. Pt refuses to go to any type of nursing home, or rehab. He says he has Medicaid, and the [facility] person ( MSW?) is working on some help in the home. Granddaughter states pt's Medicaid expired and the [facility] person is working on  getting it restarted, and is also applying for help in the home.     Action/Plan:   Action/Plan Detail:   1500 Pt refusing SNF, or any facility. Says he has been in his home for over 60 years and is not leaving. Has [facility] active in the home. No need for social work consult. Unsure at this time if pt will be admitted. ED workup is not finished   Anticipated DC Date:  12/26/2012     Status Recommendation to Physician:   Result of Recommendation:        Choice offered to / List presented to:            Status of service:    ED Comments:   ED Comments Detail:

## 2012-12-26 NOTE — ED Notes (Signed)
Alda Ponder called from [facility] and stated that social worker and APS has been to patient's house due to [facility] home conditions. Patient lives by himself and needs placement but has refused. Per Ms Emelda Fear patient did not get out off bed or eat anything yesterday due to fatigue. Emelda Fear strongly recommends patient be admitted-patient has foley in and is scheduled to have TURP done by Dr Paulino Door. Dr Deretha Emory and patient's nurse made aware. Case manager to be made aware. Alda Ponder left number to call if needed [phone].

## 2012-12-27 DIAGNOSIS — R339 Retention of urine, unspecified: Secondary | ICD-10-CM

## 2012-12-27 LAB — COMPREHENSIVE METABOLIC PANEL
ALT: 6 U/L (ref 0–53)
AST: 13 U/L (ref 0–37)
Albumin: 2.2 g/dL — ABNORMAL LOW (ref 3.5–5.2)
Alkaline Phosphatase: 45 U/L (ref 39–117)
Calcium: 7.6 mg/dL — ABNORMAL LOW (ref 8.4–10.5)
Potassium: 3.6 mEq/L (ref 3.5–5.1)
Sodium: 140 mEq/L (ref 135–145)
Total Protein: 5.4 g/dL — ABNORMAL LOW (ref 6.0–8.3)

## 2012-12-27 LAB — CBC
HCT: 33.1 % — ABNORMAL LOW (ref 39.0–52.0)
Hemoglobin: 11.1 g/dL — ABNORMAL LOW (ref 13.0–17.0)
MCHC: 33.5 g/dL (ref 30.0–36.0)
MCV: 89.5 fL (ref 78.0–100.0)
RDW: 13.6 % (ref 11.5–15.5)

## 2012-12-27 LAB — GLUCOSE, CAPILLARY: Glucose-Capillary: 137 mg/dL — ABNORMAL HIGH (ref 70–99)

## 2012-12-27 NOTE — Progress Notes (Signed)
Pt has only made 150 mL of urine since his admission yesterday evening (8/8). Urine is tea colored, clear, and has no odor. MD made aware. Will continue to monitor.

## 2012-12-27 NOTE — Progress Notes (Signed)
  SWANSON FARNELL UJW:[id number] DOB: 04/18/1928 DOA: 12/26/2012 PCP: Rudi Heap, MD   Subjective: This man feels somewhat better today. No other specific symptoms.           Physical Exam: Blood pressure 148/78, pulse 70, temperature 98 F (36.7 C), temperature source Oral, resp. rate 19, height 5\' 6"  (1.676 m), weight 59.376 kg (130 lb 14.4 oz), SpO2 98.00%. Looks better than yesterday, better hydrated. Lung fields are clear. He is alert and oriented. Abdomen is soft and nontender.   Investigations:  Recent Results (from the past 240 hour(s))  URINE CULTURE     Status: None   Collection Time    12/19/12 12:08 PM      Result Value Range Status   Urine Culture, Routine Final report   Final   Result 1 No growth   Final     Basic Metabolic Panel:  Recent Labs  56/21/30 1438 12/27/12 0555  NA 140 140  K 4.0 3.6  CL 105 108  CO2 23 19  GLUCOSE 141* 88  BUN 37* 32*  CREATININE 1.38* 1.31  CALCIUM 8.4 7.6*   Liver Function Tests:  Recent Labs  12/26/12 1438 12/27/12 0555  AST 14 13  ALT 6 6  ALKPHOS 54 45  BILITOT 0.4 0.2*  PROT 6.3 5.4*  ALBUMIN 2.6* 2.2*     CBC:  Recent Labs  12/26/12 1438 12/27/12 0555  WBC 12.6* 8.3  NEUTROABS 10.4*  --   HGB 12.9* 11.1*  HCT 38.9* 33.1*  MCV 90.0 89.5  PLT 325 285    Dg Chest Port 1 View  12/26/2012   *RADIOLOGY REPORT*  Clinical Data: Fatigue  PORTABLE CHEST - 1 VIEW  Comparison: 12/03/2012  Findings: Cardiomediastinal silhouette is stable.  No pulmonary edema.  Question trace left pleural effusion with left basilar atelectasis.  No segmental infiltrate.  IMPRESSION: Question trace left pleural effusion with left basilar atelectasis. No segmental infiltrate.  No pulmonary edema.   Original Report Authenticated By: Natasha Mead, M.D.      Medications: I have reviewed the patient's current medications.  Impression: 1. Dehydration, improving. 2. UTI, on intravenous Rocephin. 3. Diabetes mellitus. 4.  Urine retention, status post Foley catheter in situ, awaiting TURP by Dr. Jerre Simon.  5. Hypertension     Plan: 1. Reduce IV fluids.  2. Continue with IV Rocephin. 3. Physical therapy.  Consultants:  None.   Procedures:  None.   Antibiotics:  Intravenous Rocephin started 12/26/2012.                   Code Status: Full code.  Family Communication: Discuss plan with patient at the bedside.   Disposition Plan: Depending on progress.  Time spent: 15 minutes.   LOS: 1 day   Wilson Singer Pager [phone]  12/27/2012, 10:32 AM

## 2012-12-28 LAB — BASIC METABOLIC PANEL
CO2: 20 mEq/L (ref 19–32)
Calcium: 7.6 mg/dL — ABNORMAL LOW (ref 8.4–10.5)
Glucose, Bld: 133 mg/dL — ABNORMAL HIGH (ref 70–99)
Potassium: 3.5 mEq/L (ref 3.5–5.1)
Sodium: 140 mEq/L (ref 135–145)

## 2012-12-28 LAB — URINE CULTURE: Culture: NO GROWTH

## 2012-12-28 LAB — CBC
Hemoglobin: 11 g/dL — ABNORMAL LOW (ref 13.0–17.0)
MCH: 30.6 pg (ref 26.0–34.0)
RBC: 3.59 MIL/uL — ABNORMAL LOW (ref 4.22–5.81)

## 2012-12-28 LAB — GLUCOSE, CAPILLARY: Glucose-Capillary: 229 mg/dL — ABNORMAL HIGH (ref 70–99)

## 2012-12-28 NOTE — Progress Notes (Signed)
Charles Gibbs:[id number] DOB: January 27, 1928 DOA: 12/26/2012 PCP: Rudi Heap, MD   Subjective: This man feels somewhat better today. No other specific symptoms.           Physical Exam: Blood pressure 154/63, pulse 71, temperature 97.3 F (36.3 C), temperature source Oral, resp. rate 15, height 5\' 6"  (1.676 m), weight 59.376 kg (130 lb 14.4 oz), SpO2 96.00%. Looks better than yesterday, better hydrated. Lung fields are clear. He is alert and oriented. Abdomen is soft and nontender.   Investigations:  Recent Results (from the past 240 hour(s))  URINE CULTURE     Status: None   Collection Time    12/19/12 12:08 PM      Result Value Range Status   Urine Culture, Routine Final report   Final   Result 1 No growth   Final  URINE CULTURE     Status: None   Collection Time    12/26/12  3:12 PM      Result Value Range Status   Specimen Description URINE, CLEAN CATCH   Final   Special Requests NONE   Final   Culture  Setup Time     Final   Value: 12/27/2012 00:18     Performed at [facility] Count     Final   Value: NO GROWTH     Performed at [facility]   Culture     Final   Value: NO GROWTH     Performed at [facility]   Report Status 12/28/2012 FINAL   Final     Basic Metabolic Panel:  Recent Labs  95/62/13 0555 12/28/12 0544  NA 140 140  K 3.6 3.5  CL 108 111  CO2 19 20  GLUCOSE 88 133*  BUN 32* 26*  CREATININE 1.31 1.21  CALCIUM 7.6* 7.6*   Liver Function Tests:  Recent Labs  12/26/12 1438 12/27/12 0555  AST 14 13  ALT 6 6  ALKPHOS 54 45  BILITOT 0.4 0.2*  PROT 6.3 5.4*  ALBUMIN 2.6* 2.2*     CBC:  Recent Labs  12/26/12 1438 12/27/12 0555 12/28/12 0544  WBC 12.6* 8.3 7.8  NEUTROABS 10.4*  --   --   HGB 12.9* 11.1* 11.0*  HCT 38.9* 33.1* 31.9*  MCV 90.0 89.5 88.9  PLT 325 285 267    Dg Chest Port 1 View  12/26/2012   *RADIOLOGY REPORT*  Clinical Data: Fatigue  PORTABLE CHEST - 1 VIEW   Comparison: 12/03/2012  Findings: Cardiomediastinal silhouette is stable.  No pulmonary edema.  Question trace left pleural effusion with left basilar atelectasis.  No segmental infiltrate.  IMPRESSION: Question trace left pleural effusion with left basilar atelectasis. No segmental infiltrate.  No pulmonary edema.   Original Report Authenticated By: Natasha Mead, M.D.      Medications: I have reviewed the patient's current medications.  Impression: 1. Dehydration, improving. 2. UTI, on intravenous Rocephin. Urine culture so far does not show growth. 3. Diabetes mellitus. 4. Urine retention, status post Foley catheter in situ, awaiting TURP by Dr. Jerre Simon.  5. Hypertension     Plan: 1. Discontinue IV fluids. 2. Continue with intravenous Rocephin for today. 3. Mobilize. Consultants:  None.   Procedures:  None.   Antibiotics:  Intravenous Rocephin started 12/26/2012.                   Code Status: Full code.  Family Communication: Discuss plan with patient at the bedside.  Disposition Plan: Depending on progress.  Time spent: 15 minutes.   LOS: 2 days   Wilson Singer Pager [phone]  12/28/2012, 11:58 AM

## 2012-12-29 DIAGNOSIS — R319 Hematuria, unspecified: Secondary | ICD-10-CM

## 2012-12-29 LAB — URINE MICROSCOPIC-ADD ON

## 2012-12-29 LAB — GLUCOSE, CAPILLARY
Glucose-Capillary: 139 mg/dL — ABNORMAL HIGH (ref 70–99)
Glucose-Capillary: 127 mg/dL — ABNORMAL HIGH (ref 70–99)

## 2012-12-29 LAB — URINALYSIS, ROUTINE W REFLEX MICROSCOPIC
Glucose, UA: 100 mg/dL — AB
Ketones, ur: 15 mg/dL — AB
Protein, ur: >300 mg/dL — AB

## 2012-12-29 NOTE — Clinical Social Work Note (Signed)
CSW met with pt again to discuss PT eval and recommendations. Pt states he is just not feeling well with a headache and stomach ache, but be denies weakness. CSW discussed recommendation for SNF and pt states he will be going straight home from hospital and does not want rehab. He said nothing will change his mind. CSW left SNF list in room and told pt if he got home and wanted to go to SNF, he could call facilities from home. Notified CM that he still intends to go home.   Derenda Fennel, [place] [phone]

## 2012-12-29 NOTE — Progress Notes (Signed)
  Charles Gibbs:[id number] DOB: 1928-02-15 DOA: 12/26/2012 PCP: Rudi Heap, MD   Subjective: This man feels worse today. He clearly points that he has had further problems with his urine. He looks like he has Mishael hematuria now.           Physical Exam: Blood pressure 152/77, pulse 67, temperature 98 F (36.7 C), temperature source Oral, resp. rate 20, height 5\' 6"  (1.676 m), weight 59.376 kg (130 lb 14.4 oz), SpO2 99.00%. Looks worse, pale.. Lung fields are clear. He is alert and oriented. Abdomen is soft and nontender.   Investigations:  Recent Results (from the past 240 hour(s))  URINE CULTURE     Status: None   Collection Time    12/19/12 12:08 PM      Result Value Range Status   Urine Culture, Routine Final report   Final   Result 1 No growth   Final  URINE CULTURE     Status: None   Collection Time    12/26/12  3:12 PM      Result Value Range Status   Specimen Description URINE, CLEAN CATCH   Final   Special Requests NONE   Final   Culture  Setup Time     Final   Value: 12/27/2012 00:18     Performed at [facility] Count     Final   Value: NO GROWTH     Performed at [facility]   Culture     Final   Value: NO GROWTH     Performed at [facility]   Report Status 12/28/2012 FINAL   Final     Basic Metabolic Panel:  Recent Labs  81/19/14 0555 12/28/12 0544  NA 140 140  K 3.6 3.5  CL 108 111  CO2 19 20  GLUCOSE 88 133*  BUN 32* 26*  CREATININE 1.31 1.21  CALCIUM 7.6* 7.6*   Liver Function Tests:  Recent Labs  12/26/12 1438 12/27/12 0555  AST 14 13  ALT 6 6  ALKPHOS 54 45  BILITOT 0.4 0.2*  PROT 6.3 5.4*  ALBUMIN 2.6* 2.2*     CBC:  Recent Labs  12/26/12 1438 12/27/12 0555 12/28/12 0544  WBC 12.6* 8.3 7.8  NEUTROABS 10.4*  --   --   HGB 12.9* 11.1* 11.0*  HCT 38.9* 33.1* 31.9*  MCV 90.0 89.5 88.9  PLT 325 285 267    No results found.    Medications: I have reviewed the patient's  current medications.  Impression: 1. Dehydration, improving. 2. UTI, on intravenous Rocephin. Urine culture so far does not show growth. 3. Diabetes mellitus. 4. Urine retention, status post Foley catheter in situ, awaiting TURP by Dr. Jerre Simon.  5. Hypertension 6. Hematuria, this is new.    Plan: 1. Repeat urinalysis and urine culture. 2. Urology consultation with Dr. Jerre Simon. I think that he would probably benefit from TURP whilst he is in the hospital now. Consultants: Urology pending.  Procedures:  None.   Antibiotics:  Intravenous Rocephin started 12/26/2012.                   Code Status: Full code.  Family Communication: Discuss plan with patient at the bedside.   Disposition Plan: Depending on progress.  Time spent: 15 minutes.   LOS: 3 days   Wilson Singer Pager [phone]  12/29/2012, 11:36 AM

## 2012-12-29 NOTE — Clinical Social Work Psychosocial (Signed)
Clinical Social Work Department BRIEF PSYCHOSOCIAL ASSESSMENT 12/29/2012  Patient:  Charles Gibbs, Charles Gibbs     Account Number:  [id number]     Admit date:  12/26/2012  Clinical Social Worker:  Nancie Neas  Date/Time:  12/29/2012 11:50 AM  Referred by:  Care Management  Date Referred:  12/29/2012 Referred for  Psychosocial assessment   Other Referral:   Interview type:  Patient Other interview type:   daughter and granddaughter    PSYCHOSOCIAL DATA Living Status:  ALONE Admitted from facility:   Level of care:   Primary support name:  Paulette Primary support relationship to patient:  CHILD, ADULT Degree of support available:   supportive per pt    CURRENT CONCERNS Current Concerns  Other - See comment   Other Concerns:   DSS at risk caseworker    SOCIAL WORK ASSESSMENT / PLAN CSW received report from [facility] that pt has open APS case. CSW called Shawna Orleans, Child psychotherapist at [employer] who reports pt does not have an open case, but does have an at-risk caseworker assigned to him. They are currently working on applying for Medicaid for pt in order for him to have a CAP aid. CSW met with pt and pt's daughter and granddaughter. Pt lives alone, but his daughter and granddaughter live nearby. Pt indicates that he does fairly well on his own. He no longer drives, but his daughter is available to transport if needed. When asked about meals, pt indicates he has Meals on Wheels. They would like to continue home health and are ready for him to have a CAP aid. Pt refuses placement of any kind.   Assessment/plan status:  Referral to [facility] Other assessment/ plan:   Information/referral to community resources:   CM for home health    PATIENT'S/FAMILY'S RESPONSE TO PLAN OF CARE: Pt states he plans to return home at d/c. CSW will sign off as pt does not have an open APS case and no other CSW needs reported by pt.       Derenda Fennel, [place] [phone]

## 2012-12-29 NOTE — Progress Notes (Signed)
I noticed the bruising on his arms and asked pt where they came from. Pt verbalized to me that he did fall at home prior to this admission. Will continue to monitor, case management is aware.

## 2012-12-29 NOTE — Progress Notes (Signed)
UR chart review completed.  

## 2012-12-29 NOTE — Consult Note (Signed)
Note [phone]

## 2012-12-29 NOTE — Evaluation (Signed)
Physical Therapy Evaluation Patient Details Name: Charles Gibbs MRN: [id number] DOB: 1928-02-07 Today's Date: 12/29/2012 Time: 4098-1191 PT Time Calculation (min): 54 min  PT Assessment / Plan / Recommendation History of Present Illness  Pt is admitted again...this time for URI and weakness.  Clinical Impression  Pt is seen for evaluation.  There has been a major decline in his strength and endurance since last admission and he currently is unable to ambulate without a walker.  He needs moderate assistance to transfer out of bed or sit to stand.  It will be out of the question for him to try to live on his own at this time.  I have strongly advised him to consider SNF in order to avoid dire consequences.    PT Assessment  Patient needs continued PT services    Follow Up Recommendations  SNF    Does the patient have the potential to tolerate intense rehabilitation      Barriers to Discharge Decreased caregiver support      Equipment Recommendations  None recommended by PT    Recommendations for Other Services     Frequency Min 3X/week    Precautions / Restrictions Precautions Precautions: Fall Restrictions Weight Bearing Restrictions: No   Pertinent Vitals/Pain       Mobility  Bed Mobility Supine to Sit: 3: Mod assist;HOB elevated Details for Bed Mobility Assistance: there has been a significant decline in bed mobility from last eval Transfers Sit to Stand: 3: Mod assist;From bed;From chair/3-in-1;With upper extremity assist Stand to Sit: 3: Mod assist;With upper extremity assist;To chair/3-in-1;To bed Ambulation/Gait Ambulation/Gait Assistance: 4: Min assist Ambulation Distance (Feet): 80 Feet Assistive device: Rolling walker Ambulation/Gait Assistance Details: pt now unable to ambulate without a walker...he appears to have weakness through left trunk evidenced during gait. Gait Pattern: Decreased hip/knee flexion - right;Decreased hip/knee flexion - left;Trunk  flexed;Trunk rotated posteriorly on left;Left foot flat;Right foot flat Gait velocity: slow and labored Stairs: No    Exercises General Exercises - Lower Extremity Ankle Circles/Pumps: AROM;Both;10 reps;Supine Short Arc Quad: AROM;Both;10 reps;Supine Heel Slides: Both;10 reps;Supine;AAROM Hip ABduction/ADduction: AAROM;Both;10 reps;Supine   PT Diagnosis:    PT Problem List: Decreased strength;Decreased activity tolerance;Decreased mobility;Decreased knowledge of use of DME PT Treatment Interventions: Gait training;Functional mobility training;Therapeutic activities;Therapeutic exercise;Patient/family education     PT Goals(Current goals can be found in the care plan section) Acute Rehab PT Goals Patient Stated Goal: wants to return home PT Goal Formulation: With patient Time For Goal Achievement: 01/12/13 Potential to Achieve Goals: Good  Visit Information  Last PT Received On: 12/29/12 Assistance Needed: +1 History of Present Illness: Pt is admitted again...this time for URI and weakness.       Prior Functioning  Home Living Family/patient expects to be discharged to:: Skilled nursing facility    Cognition  Cognition Arousal/Alertness: Awake/alert Overall Cognitive Status: Within Functional Limits for tasks assessed    Extremity/Trunk Assessment Lower Extremity Assessment Lower Extremity Assessment: Generalized weakness Cervical / Trunk Assessment Cervical / Trunk Assessment: Kyphotic   Balance Balance Balance Assessed: No  End of Session PT - End of Session Equipment Utilized During Treatment: Gait belt Activity Tolerance: Patient limited by fatigue Patient left: in chair;with call bell/phone within reach;with chair alarm set  GP     [name] 12/29/2012, 2:03 PM

## 2012-12-29 NOTE — Care Management Note (Signed)
    Page 1 of 2   01/06/2013     10:37:45 AM   CARE MANAGEMENT NOTE 01/06/2013  Patient:  Charles Gibbs, Charles Gibbs   Account Number:  [id number]  Date Initiated:  12/29/2012  Documentation initiated by:  Sharrie Rothman  Subjective/Objective Assessment:   Pt admitted from home with dehydration and UTI. Pt lives alone and at this time states that he will return home at discharge. Pt has a daughter and granddaughter that checks on pt frequenntly but does not see pt daily. Pt is alone most of     Action/Plan:   the time. pt is active with [facility] and has a cane, walker, and BSC. Pt has PT consult. WIll continue to follow for [facility] needs.   Anticipated DC Date:  01/06/2013   Anticipated DC Plan:  HOME W HOME HEALTH SERVICES      DC Planning Services  CM consult      Choice offered to / List presented to:          [facility] arranged  HH-1 RN  HH-10 DISEASE MANAGEMENT      HH agency  [facility]   Status of service:  Completed, signed off Medicare Important Message given?  YES (If response is "NO", the following Medicare IM given date fields will be blank) Date Medicare IM given:  01/02/2013 Date Additional Medicare IM given:  01/06/2013  Discharge Disposition:  HOME W HOME HEALTH SERVICES  Per UR Regulation:    If discussed at Long Length of Stay Meetings, dates discussed:   01/01/2013    Comments:  01/06/13 Rosemary Holms RN BSN CM Pt adamantly refuses to go to SNF. Both CM and CSW spoke with pt. IM signed with an "X", witnessed by Benjaman Lobe RN and CM at 10:00 am. RN [place] to call daughter to notify of DC. HH RN set up with [facility].  01/05/13 Rosemary Holms RN BSN CM Pt still inpt today. TURP scheduled for tomorrow. CM will follow  01/02/13 1315 Arlyss Queen, RN BSN CM Pt may discharge over the weekend. Pt is active with [facility] Rn and PT. Weekend staff to call and fax orders once written. No other CM needs noted.  12/31/12 1445 Arlyss Queen, RN BSN CM Cm spoke to pts daughter and  granddaughter again about discharge plans. Family wants pt to go to SNF but pt is refusing at this time. Pts daughter is aware that pt is able to make that decision.  Pt already active with [facility] and DSS is trying to arrange for CAP/PCS aide. Pt has DME in the home as well. Pt will return to hospital next week for surgery.  12/29/12 1115 Arlyss Queen, RN BSN CM

## 2012-12-29 NOTE — Telephone Encounter (Signed)
After reviewing chart I realized that the patient has been hospitalized and is awaiting a TURP by Dr. Jerre Simon.

## 2012-12-29 NOTE — Consult Note (Signed)
[phone]

## 2012-12-30 ENCOUNTER — Inpatient Hospital Stay (HOSPITAL_COMMUNITY): Payer: PRIVATE HEALTH INSURANCE

## 2012-12-30 LAB — COMPREHENSIVE METABOLIC PANEL
ALT: <5 U/L (ref 0–53)
Calcium: 7.7 mg/dL — ABNORMAL LOW (ref 8.4–10.5)
GFR calc Af Amer: 54 mL/min — ABNORMAL LOW (ref >90)
Glucose, Bld: 135 mg/dL — ABNORMAL HIGH (ref 70–99)
Sodium: 139 mEq/L (ref 135–145)
Total Protein: 5.1 g/dL — ABNORMAL LOW (ref 6.0–8.3)

## 2012-12-30 LAB — GLUCOSE, CAPILLARY
Glucose-Capillary: 154 mg/dL — ABNORMAL HIGH (ref 70–99)
Glucose-Capillary: 154 mg/dL — ABNORMAL HIGH (ref 70–99)
Glucose-Capillary: 148 mg/dL — ABNORMAL HIGH (ref 70–99)
Glucose-Capillary: 131 mg/dL — ABNORMAL HIGH (ref 70–99)

## 2012-12-30 LAB — CBC
Hemoglobin: 10.7 g/dL — ABNORMAL LOW (ref 13.0–17.0)
MCH: 30.1 pg (ref 26.0–34.0)
MCHC: 33.9 g/dL (ref 30.0–36.0)
RDW: 13.8 % (ref 11.5–15.5)

## 2012-12-30 LAB — URINE CULTURE

## 2012-12-30 MED ORDER — FLEET ENEMA 7-19 GM/118ML RE ENEM
1.0000 | ENEMA | Freq: Every day | RECTAL | Status: DC | PRN
  Administered 2012-12-30 – 2013-01-02 (×2): 1 via RECTAL

## 2012-12-30 MED ORDER — SODIUM CHLORIDE 0.9 % IV SOLN
INTRAVENOUS | Status: DC
  Administered 2012-12-30 – 2012-12-31 (×2): via INTRAVENOUS

## 2012-12-30 MED ORDER — IOHEXOL 300 MG/ML  SOLN
125.0000 mL | Freq: Once | INTRAMUSCULAR | Status: AC | PRN
  Administered 2012-12-30: 125 mL via INTRAVENOUS

## 2012-12-30 NOTE — Consult Note (Signed)
NAME:  Charles Gibbs, Charles Gibbs NO.:  [id number]  MEDICAL RECORD NO.:  [id number]  LOCATION:  A319                          FACILITY:  [facility]  PHYSICIAN:  Ky Barban, M.D.DATE OF BIRTH:  1928-04-23  DATE OF CONSULTATION: DATE OF DISCHARGE:                                CONSULTATION   A 79 year old gentleman, who I have seen in the office.  I will have to go to the office and review his record and workup.  He is admitted with gross hematuria and generalized weakness.  According to the notes, he is waiting to get a TURP to be done by me.  I am going to review those notes and come back and then. I will see the patient and write a detailed note.     Ky Barban, M.D.     MIJ/MEDQ  D:  12/29/2012  T:  12/30/2012  Job:  [id number]

## 2012-12-30 NOTE — Progress Notes (Signed)
  Charles Gibbs ZOX:[id number] DOB: Feb 08, 1928 DOA: 12/26/2012 PCP: Rudi Heap, MD   Subjective: This man feels worse today. He clearly points that he has had further problems with his urine. He continues to have [place] hematuria. He has been seen by Dr. Jerre Simon and we are awaiting CT scan of the abdomen. Apparently when he previously sooner seen by Dr. 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 There was no cancer in the bladder apparently.           Physical Exam: Blood pressure 163/72, pulse 66, temperature 97.2 F (36.2 C), temperature source Oral, resp. rate 20, height 5\' 6"  (1.676 m), weight 59.376 kg (130 lb 14.4 oz), SpO2 96.00%. Looks  pale.. Lung fields are clear. He is alert and oriented. Abdomen is soft and nontender.   Investigations:  Recent Results (from the past 240 hour(s))  URINE CULTURE     Status: None   Collection Time    12/26/12  3:12 PM      Result Value Range Status   Specimen Description URINE, CLEAN CATCH   Final   Special Requests NONE   Final   Culture  Setup Time     Final   Value: 12/27/2012 00:18     Performed at [facility] Count     Final   Value: NO GROWTH     Performed at [facility]   Culture     Final   Value: NO GROWTH     Performed at [facility]   Report Status 12/28/2012 FINAL   Final     Basic Metabolic Panel:  Recent Labs  81/19/14 0544 12/30/12 0526  NA 140 139  K 3.5 3.3*  CL 111 110  CO2 20 21  GLUCOSE 133* 135*  BUN 26* 20  CREATININE 1.21 1.36*  CALCIUM 7.6* 7.7*   Liver Function Tests:  Recent Labs  12/30/12 0526  AST 12  ALT <5  ALKPHOS 41  BILITOT 0.2*  PROT 5.1*  ALBUMIN 2.1*     CBC:  Recent Labs  12/28/12 0544 12/30/12 0526  WBC 7.8 7.8  HGB 11.0* 10.7*  HCT 31.9* 31.6*  MCV 88.9 88.8  PLT 267 256    No results found.    Medications: I have reviewed the patient's current medications.  Impression: 1.  Dehydration. Creatinine increasing again. 2. UTI, on intravenous Rocephin. Urine culture so far does not show growth. 3. Diabetes mellitus. 4. Urine retention, status post Foley catheter in situ, awaiting TURP by Dr. Jerre Simon.  5. Hypertension 6. Hematuria, this is new.    Plan: 1. Continue with IV fluids and intravenous antibiotics with time being. 2. Await CT abdomen scan report and further recommendations from urology, Dr. Jerre Simon. Consultants: Urology , Dr. Jerre Simon.  Procedures:  None.   Antibiotics:  Intravenous Rocephin started 12/26/2012.                   Code Status: Full code.  Family Communication: Discuss plan with patient at the bedside.   Disposition Plan: Depending on progress.  Time spent: 15 minutes.   LOS: 4 days   Wilson Singer Pager [phone]  12/30/2012, 11:47 AM

## 2012-12-30 NOTE — Progress Notes (Signed)
Physical Therapy Treatment Patient Details Name: Charles Gibbs MRN: [id number] DOB: 05-02-1928 Today's Date: 12/30/2012 Time: 1478-2956 PT Time Calculation (min): 34 min  PT Assessment / Plan / Recommendation  History of Present Illness     PT Comments   Pt has no specific c/o.  He was able to participate in therapeutic exercise with no distress.  Transfers were a bit less labored and his ambulatory endurance was improved.  He continues to be generally weak and I still would recommend SNF at d/c.  Follow Up Recommendations        Does the patient have the potential to tolerate intense rehabilitation     Barriers to Discharge        Equipment Recommendations       Recommendations for Other Services    Frequency     Progress towards PT Goals Progress towards PT goals: Progressing toward goals  Plan Current plan remains appropriate    Precautions / Restrictions     Pertinent Vitals/Pain     Mobility  Bed Mobility Supine to Sit: 4: Min assist;HOB elevated Details for Bed Mobility Assistance: HOB up much higher than yesterday Transfers Sit to Stand: 4: Min assist;From elevated surface;With upper extremity assist Stand to Sit: 4: Min guard;To chair/3-in-1 Ambulation/Gait Ambulation/Gait Assistance: 4: Min assist Ambulation Distance (Feet): 120 Feet Assistive device: Rolling walker Gait velocity: slow Stairs: No    Exercises General Exercises - Lower Extremity Ankle Circles/Pumps: AROM;Both;10 reps;Supine Short Arc Quad: AROM;Both;10 reps;Supine Heel Slides: AROM;Both;10 reps;Supine Hip ABduction/ADduction: AROM;Both;10 reps;Supine   PT Diagnosis:    PT Problem List:   PT Treatment Interventions:     PT Goals (current goals can now be found in the care plan section)    Visit Information  Last PT Received On: 12/30/12    Subjective Data      Cognition  Cognition Arousal/Alertness: Awake/alert Behavior During Therapy: [facility] for tasks  assessed/performed Overall Cognitive Status: Within Functional Limits for tasks assessed    Balance     End of Session PT - End of Session Equipment Utilized During Treatment: Gait belt Activity Tolerance: Patient tolerated treatment well Patient left: in chair;with call bell/phone within reach;with chair alarm set Nurse Communication: Mobility status   GP     [name] 12/30/2012, 12:31 PM

## 2012-12-30 NOTE — Progress Notes (Signed)
[phone]

## 2012-12-30 NOTE — Plan of Care (Signed)
Problem: Phase II Progression Outcomes Goal: Progress activity as tolerated unless otherwise ordered Explained that pt needs to get stronger before Dr. Stann Mainland be comfortable performing surgery (TURP)

## 2012-12-30 NOTE — Progress Notes (Signed)
Pt bleeding from catheter site. Scant amount of bright red blood noted on bed pad and toilet.  Patient not complaining of discomfort or pain.  Pericare and catheter care completed at this time and pt tolerated well with no s/s of distress or pain noted.  Will continue to monitor blood loss.

## 2012-12-30 NOTE — Consult Note (Signed)
NAME:  Charles Gibbs, FONTAN NO.:  [id number]  MEDICAL RECORD NO.:  [id number]  LOCATION:  A319                          FACILITY:  [facility]  PHYSICIAN:  Ky Barban, M.D.DATE OF BIRTH:  1927-12-30  DATE OF CONSULTATION: DATE OF DISCHARGE:                                CONSULTATION   CHIEF COMPLAINT:  Acute urinary retention.  HISTORY:  An 79 year old gentleman.  Few weeks ago, he was in the hospital with large bladder stone and urinary retention.  I did a litholapaxy and postoperatively, he has gone into retention.  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  He does not have any cancer in the bladder.  He is an 79 year old gentleman who is in urinary retention and all the notes are saying that he is waiting to have a TUR prostate.  I reviewed my notes. 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  What I did was I simply cauterized that area with cautery.  At the most, I did the biopsy of the prostatic urethra and re-cauterized that area.  I do not think I want to do TUR prostate on this patient.  He is 79 years old and his other complaints are that he feels very weak.  He can only walk with the help of a walker.  He came with dehydration and possible urinary tract infection, and he has a Foley catheter.  I see the notes are setting that he is waiting to have a TUR prostate.  I was planning to do that, which might help him, but he is so weak.  I do not want to do it tomorrow, maybe next week, he is feeling any better, then I will do it.  He has refused completely.  He does want to go to nursing home. He lives by himself.  He has several other medical problems, which include diabetes  non-insulin type, inner ear infection, vertigo.  Gone up with reviewed results, I got the impression that he already had a TUR prostate, he was scheduled to have a TUR prostate, but it was canceled, so I want to make a note of this thing that he had no TUR prostate. Only surgery he had by me was litholapaxy and biopsy of the prostatic urethra.  It shows a high-grade transitional cell carcinoma.  He had a renal ultrasound done by me in December 03, 2012, last month this year and it shows that he has no hydronephrosis of the right kidney.  Right kidney cortex is well preserved, normal size and normal echotexture, but the problem is that left kidney is atrophic, measuring only 5.6 cm and may need studies with dye on that side, but I could not do it at that time, he was having problem with the serum creatinine.  It has gone almost normal range, so I will order a CT with contrast tomorrow to make sure he does not have  a transitional cell carcinoma in the pelvis or the ureter.  His other laboratory data; sodium is 144, potassium 4.4, chloride 104, CO2 is 29, BUN is 30, creatinine 1.15, calcium 8.1, glucose 161.  WBC count is 10.4, hematocrit 38.1, all these values are from December 19, 2012.  His serum creatinine on December 28, 2012, which was yesterday was 1.21, BUN is 26, so it is normal creatinine and so I will go ahead and order a CT with contrast.     Ky Barban, M.D.     MIJ/MEDQ  D:  12/29/2012  T:  12/30/2012  Job:  [id number]

## 2012-12-30 NOTE — Progress Notes (Signed)
Pt heparin dose not given due to bleeding from catheter site.

## 2012-12-31 DIAGNOSIS — E872 Acidosis, unspecified: Secondary | ICD-10-CM

## 2012-12-31 DIAGNOSIS — N179 Acute kidney failure, unspecified: Secondary | ICD-10-CM | POA: Diagnosis present

## 2012-12-31 DIAGNOSIS — D72829 Elevated white blood cell count, unspecified: Secondary | ICD-10-CM

## 2012-12-31 DIAGNOSIS — E876 Hypokalemia: Secondary | ICD-10-CM

## 2012-12-31 LAB — GLUCOSE, CAPILLARY
Glucose-Capillary: 146 mg/dL — ABNORMAL HIGH (ref 70–99)
Glucose-Capillary: 282 mg/dL — ABNORMAL HIGH (ref 70–99)
Glucose-Capillary: 141 mg/dL — ABNORMAL HIGH (ref 70–99)
Glucose-Capillary: 125 mg/dL — ABNORMAL HIGH (ref 70–99)

## 2012-12-31 LAB — CBC
Hemoglobin: 12.3 g/dL — ABNORMAL LOW (ref 13.0–17.0)
Platelets: 306 10*3/uL (ref 150–400)
RBC: 4.12 MIL/uL — ABNORMAL LOW (ref 4.22–5.81)
WBC: 17.3 10*3/uL — ABNORMAL HIGH (ref 4.0–10.5)

## 2012-12-31 LAB — BASIC METABOLIC PANEL
CO2: 15 mEq/L — ABNORMAL LOW (ref 19–32)
Chloride: 103 mEq/L (ref 96–112)
Glucose, Bld: 240 mg/dL — ABNORMAL HIGH (ref 70–99)
Potassium: 3.7 mEq/L (ref 3.5–5.1)
Sodium: 136 mEq/L (ref 135–145)

## 2012-12-31 MED ORDER — CIPROFLOXACIN IN D5W 400 MG/200ML IV SOLN
400.0000 mg | Freq: Two times a day (BID) | INTRAVENOUS | Status: DC
  Administered 2012-12-31 – 2013-01-01 (×2): 400 mg via INTRAVENOUS
  Filled 2012-12-31 (×2): qty 200

## 2012-12-31 MED ORDER — STERILE WATER FOR INJECTION IV SOLN
INTRAVENOUS | Status: DC
  Administered 2012-12-31 – 2013-01-01 (×2): via INTRAVENOUS
  Filled 2012-12-31 (×3): qty 850

## 2012-12-31 NOTE — Progress Notes (Signed)
NAME:  Charles Gibbs, Charles Gibbs NO.:  [id number]  MEDICAL RECORD NO.:  [id number]  LOCATION:  A319                          FACILITY:  [facility]  PHYSICIAN:  Ky Barban, M.D.DATE OF BIRTH:  Apr 21, 1928  DATE OF PROCEDURE: DATE OF DISCHARGE:                                PROGRESS NOTE   [name] general medical condition is unchanged.  He had a CT scan done today, I am still waiting for the final report.  I reviewed the CT scan, looks like he may have a stone in the prostatic urethra, that may be the cause of his retention again, this piece maybe left from his previous litholapaxy I think.  My plan will be to do cystoscopy after I see the final report on CT.  If that is what it is, then he will need extraction of the stone and I am also going to do TUR prostate, resect the prostatic urethra.  Hopefully, it will also help him with the tumor. He has multiple stones in the left atrophic kidney.     Ky Barban, M.D.     MIJ/MEDQ  D:  12/30/2012  T:  12/31/2012  Job:  [id number]

## 2012-12-31 NOTE — Progress Notes (Signed)
[facility] PROGRESS NOTE  Charles Gibbs ZOX:[id number] DOB: 1928-01-14 DOA: 12/26/2012 PCP: Rudi Heap, MD  Brief narrative 79 year old male patient with history of DM 2, chronic indwelling Foley catheter for urinary retention, HTN was admitted on 12/26/12 with generalized weakness and a possible fall. He recently underwent lithopexy for large bladder stone and urinary retention. 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 He is due to have TURP by urology. During recent admission in July, patient declined SNF. He was admitted for dehydration, UTI and fall at home and was treated with IV fluids and Rocephin. On 8/11, patient started having Herron hematuria and urology was consulted.  Assessment/Plan: 1. Gross Hematuria/high-grade transitional cell carcinoma: Possibly from local extension of transitional cell carcinoma. The patient is oozing blood around the Foley catheter. Urology is following & await their recommendations. DC antiplatelets & heparin. Repeated urine cultures x 3: Negative. DC IV antibiotics for this indication. 2. High anion gap metabolic acidosis: Unclear etiology. Check lactate.? Secondary to renal insufficiency but creatinine not significantly elevated. We'll change IV fluids to bicarbonate drip and follow BMP in a.m. 3. Hypokalemia: Repleted. 4. Leukocytosis:? Stress margination.? Prostatitis-continue IV Rocephin for now .No other clinical focus of infection. 5. Acute renal failure:? dehydration. IV fluids and follow BMP. No obstruction on CT. 6. Uncontrolled type II DM: SSI 7. HTN: on ACEI. Controlled.   Code Status:  Full Family Communication:  Discussed with patient's daughter and granddaughter at bedside. Disposition Plan:  Patient declines SNF. To be determined.   Consultants:   Urology  Procedures:   Indwelling Foley catheter  Antibiotics:   IV Rocephin 8/8 > 8/13   HPI/Subjective:  Patient denies complaints. Gross  hematuria-soiling sheets. Denies pain, cough or dyspnea. Per family, generalized weakness.  Objective: Filed Vitals:   12/30/12 1426 12/30/12 2108 12/31/12 0439 12/31/12 0910  BP: 164/65 124/82 153/78 148/82  Pulse: 74 68 99   Temp: 98.1 F (36.7 C) 97.6 F (36.4 C) 97.9 F (36.6 C)   TempSrc: Oral Oral Oral   Resp: 18  18   Height:      Weight:      SpO2: 100% 97% 100%     Intake/Output Summary (Last 24 hours) at 12/31/12 1021 Last data filed at 12/30/12 1700  Gross per 24 hour  Intake    480 ml  Output    625 ml  Net   -145 ml   Filed Weights   12/26/12 1225 12/26/12 1848  Weight: 59.421 kg (131 lb) 59.376 kg (130 lb 14.4 oz)    Exam:   General exam: Comfortable. Seen ambulating with PT. Cachectic and frail. Chronically ill-looking.  Respiratory system: Clear. No increased work of breathing.  Cardiovascular system: S1 & S2 heard, RRR. No JVD, murmurs, gallops, clicks or pedal edema.  Gastrointestinal system: Abdomen is  mildly distended , soft and nontender. Normal bowel sounds heard. Foley catheter with oozing of bright red blood around the catheter. Soiling of covering sheets.  Central nervous system: Alert and oriented. No focal neurological deficits.  Extremities: Symmetric 5 x 5 power.   Data Reviewed: Basic Metabolic Panel:  Recent Labs Lab 12/26/12 1438 12/27/12 0555 12/28/12 0544 12/30/12 0526 12/31/12 0526  NA 140 140 140 139 136  K 4.0 3.6 3.5 3.3* 3.7  CL 105 108 111 110 103  CO2 23 19 20 21  15*  GLUCOSE 141* 88 133* 135* 240*  BUN 37* 32* 26* 20 21  CREATININE 1.38* 1.31 1.21 1.36* 1.31  CALCIUM 8.4  7.6* 7.6* 7.7* 8.2*   Liver Function Tests:  Recent Labs Lab 12/26/12 1438 12/27/12 0555 12/30/12 0526  AST 14 13 12   ALT 6 6 <5  ALKPHOS 54 45 41  BILITOT 0.4 0.2* 0.2*  PROT 6.3 5.4* 5.1*  ALBUMIN 2.6* 2.2* 2.1*   No results found for this basename: LIPASE, AMYLASE,  in the last 168 hours No results found for this basename:  AMMONIA,  in the last 168 hours CBC:  Recent Labs Lab 12/26/12 1438 12/27/12 0555 12/28/12 0544 12/30/12 0526 12/31/12 0526  WBC 12.6* 8.3 7.8 7.8 17.3*  NEUTROABS 10.4*  --   --   --   --   HGB 12.9* 11.1* 11.0* 10.7* 12.3*  HCT 38.9* 33.1* 31.9* 31.6* 37.0*  MCV 90.0 89.5 88.9 88.8 89.8  PLT 325 285 267 256 306   Cardiac Enzymes: No results found for this basename: CKTOTAL, CKMB, CKMBINDEX, TROPONINI,  in the last 168 hours BNP (last 3 results) No results found for this basename: PROBNP,  in the last 8760 hours CBG:  Recent Labs Lab 12/30/12 0709 12/30/12 1146 12/30/12 1646 12/30/12 2106 12/31/12 0731  GLUCAP 148* 109* 131* 146* 282*    Recent Results (from the past 240 hour(s))  URINE CULTURE     Status: None   Collection Time    12/26/12  3:12 PM      Result Value Range Status   Specimen Description URINE, CLEAN CATCH   Final   Special Requests NONE   Final   Culture  Setup Time     Final   Value: 12/27/2012 00:18     Performed at [facility] Count     Final   Value: NO GROWTH     Performed at [facility]   Culture     Final   Value: NO GROWTH     Performed at [facility]   Report Status 12/28/2012 FINAL   Final  URINE CULTURE     Status: None   Collection Time    12/29/12 11:45 AM      Result Value Range Status   Specimen Description URINE, CATHETERIZED   Final   Special Requests NONE   Final   Culture  Setup Time     Final   Value: 12/29/2012 17:50     Performed at [facility] Count     Final   Value: NO GROWTH     Performed at [facility]   Culture     Final   Value: NO GROWTH     Performed at [facility]   Report Status 12/30/2012 FINAL   Final     Studies: Ct Abdomen Pelvis W Wo Contrast  12/30/2012   *RADIOLOGY REPORT*  Clinical Data: 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  History of diabetes and hypertension.  CT ABDOMEN AND  PELVIS WITHOUT AND WITH CONTRAST  Technique:  Multidetector CT imaging of the abdomen and pelvis was performed without contrast material in one or both body regions, followed by contrast material(s) and further sections in one or both body regions.  Contrast: OMNIPAQUE IOHEXOL 300 MG/ML  SOLN  Comparison: Renal ultrasound 12/03/2012.  Findings: There are moderate sized dependent pleural effusions bilaterally with associated bibasilar atelectasis.  Coronary artery calcifications are noted.  Precontrast images demonstrate marked atrophy of the left kidney with multiple large calyceal calculi in its lower pole and interpolar region.  No right-sided renal or ureteral calculi are demonstrated.  Postcontrast, the right kidney enhances normally and demonstrates no evidence of mass or urothelial lesion.  There is limited excretion from the left kidney.  No left renal mass is seen. There is no evidence of ureteral obstruction.  A Foley catheter is in place.  There is air within the bladder. There is irregular bladder wall thickening and perivesical soft tissue stranding.  There are asymmetric calcifications within the right lobe of the prostate gland surrounding an irregular low density mass or fluid collection, measuring 2.7 x 1.7 cm on image 81 of series 6.  The postcontrast images also demonstrate heterogeneous enhancement surrounding the prostatic urethra.  The seminal vesicles appear normal. Some asymmetric enhancement extends along the vas deferens on the left.  There is a preserved fat plane between the rectum and the prostate gland.  No enlarged abdominal pelvic lymph nodes are seen.  The liver, gallbladder, pancreas, spleen and adrenal glands appear normal.  There is moderate aorto iliac atherosclerosis without aneurysm or large vessel occlusion.  No bowel abnormalities are identified. There is bilateral flank edema, worse on the right.  There is diffuse thoracolumbar spondylosis.  IMPRESSION:  1.  Irregular  periurethral enhancement extending asymmetrically into the right prostatic lobe concerning for local extension of transitional cell carcinoma.  No direct invasion of the adjacent pelvic structures is identified.  There is some peripheral enhancement extending along the vas deferens on the left.  2.  Nonspecific bladder wall thickening and perivesical soft tissue stranding.  No pelvic adenopathy or ureteral obstruction identified. 3.  Chronic left renal atrophy with multiple calyceal calculi.  The right kidney demonstrates no significant findings. 4.  No evidence of metastatic disease. 5.  Flank edema with moderate bilateral pleural effusions.   Original Report Authenticated By: Carey Bullocks, M.D.     Additional labs:   Scheduled Meds: . aspirin  81 mg Oral q morning - 10a  . atorvastatin  10 mg Oral q1800  . cefTRIAXone (ROCEPHIN)  IV  1 g Intravenous Q24H  . heparin  5,000 Units Subcutaneous Q8H  . insulin aspart  0-15 Units Subcutaneous TID WC  . insulin aspart  0-5 Units Subcutaneous QHS  . lisinopril  10 mg Oral Daily   Continuous Infusions: . sodium chloride 75 mL/hr at 12/31/12 0110    Active Problems:   DM (diabetes mellitus), type 2, uncontrolled   Dehydration   Fall at home   Urinary retention   Unspecified essential hypertension   UTI (urinary tract infection)    Time spent: 45 minutes.    [facility]  [facility] Pager [phone].   If 8PM-8AM, please contact night-coverage at [email or web address], password [facility] 12/31/2012, 10:21 AM  LOS: 5 days

## 2012-12-31 NOTE — Progress Notes (Signed)
Physical Therapy Treatment Patient Details Name: Charles Gibbs MRN: [id number] DOB: [birth year] Today's Date: 12/31/2012 Time: 4098-1191 PT Time Calculation (min): 30 min  PT Assessment / Plan / Recommendation  History of Present Illness     PT Comments   Pt tolerated well towards total session.  Improved mobility noted this session, min guard with LE with supine to sit with cueing for handplacement to assist with scooting to EOB.  Increased distance with gait training, used RW for genalized weakness with cueing to equalize stride length for more normalized gait mechanics and for posture.  Pt left in chair with chair alarm set and call bell within reach.  Family present in room.  Follow Up Recommendations        Does the patient have the potential to tolerate intense rehabilitation     Barriers to Discharge        Equipment Recommendations       Recommendations for Other Services    Frequency     Progress towards PT Goals Progress towards PT goals: Progressing toward goals  Plan      Precautions / Restrictions Precautions Precautions: Fall Restrictions Weight Bearing Restrictions: No    Mobility  Bed Mobility Supine to Sit: 4: Min assist Transfers Transfers: Sit to Stand;Stand to Sit Sit to Stand: 4: Min assist;From elevated surface;With upper extremity assist Stand to Sit: 4: Min guard;To chair/3-in-1 Ambulation/Gait Ambulation/Gait Assistance: 4: Min assist Ambulation Distance (Feet): 220 Feet Assistive device: Rolling walker Ambulation/Gait Assistance Details: slow and labored.  Ambulated with RW due to weakness Gait Pattern: Decreased hip/knee flexion - right;Decreased hip/knee flexion - left;Trunk flexed;Trunk rotated posteriorly on left;Left foot flat;Right foot flat Gait velocity: slow General Gait Details: no LOB during gait, slow and labored Stairs: No    Exercises General Exercises - Lower Extremity Ankle Circles/Pumps: AROM;Both;10 reps;Supine Quad  Sets: AROM;10 reps Short Arc Quad: AROM;Both;10 reps;Supine Long Arc Quad: AROM;Both;10 reps;Seated Heel Slides: AROM;Both;10 reps;Supine Hip ABduction/ADduction: AROM;Both;10 reps;Supine Hip Flexion/Marching: AROM;Both;20 reps;Seated   PT Diagnosis:    PT Problem List:   PT Treatment Interventions:     PT Goals (current goals can now be found in the care plan section)    Visit Information  Last PT Received On: 12/31/12    Subjective Data   I'm feeling weak, no pain.   Cognition  Cognition Arousal/Alertness: Awake/alert Behavior During Therapy: WFL for tasks assessed/performed Overall Cognitive Status: Within Functional Limits for tasks assessed    Balance     End of Session PT - End of Session Equipment Utilized During Treatment: Gait belt Activity Tolerance: Patient tolerated treatment well Patient left: in chair;with call bell/phone within reach;with chair alarm set;with family/visitor present Nurse Communication: Mobility status   GP     Juel Burrow 12/31/2012, 10:32 AM

## 2012-12-31 NOTE — Progress Notes (Signed)
Pt still having bleeding from catheter site.  Balloon filled with 5 cc of saline, drain sponges applied around foley tube.  Pt continuing to bleed.  Heparin dose held for 0600 due to bleeding.  Pt given two enemas this shift with little to no results.  Pt still passing small balls of formed stool and sometimes loose stool.  Pt feels as if he has to have a BM and has been up and down to commode all shift.  Dr. Orvan Falconer notified during down time at aprox. 0035 at which time orders for soap sud enema was given.  Pt has been unsuccessful with the enemas.

## 2012-12-31 NOTE — Plan of Care (Signed)
Pt continues to have the need to get up to commode for BM every 15 or so minutes - yet is only producing rabbit size stool (1-2 at a time.) Pt continues to need full bed/gown changes every hour or so due to saturation of blood and urine seeping around catheter.  Dr. Jerre Simon and Raina Mina have been made aware.

## 2012-12-31 NOTE — Plan of Care (Signed)
Pt continues to need 2:1 care when pivoting from bed to chair. Pt is extremely weak and unsteady when trying to fall. Pt's bed/chair alarm remain on for pt's safety.

## 2013-01-01 DIAGNOSIS — D649 Anemia, unspecified: Secondary | ICD-10-CM

## 2013-01-01 LAB — TYPE AND SCREEN
ABO/RH(D): A POS
Antibody Screen: NEGATIVE

## 2013-01-01 LAB — GLUCOSE, CAPILLARY: Glucose-Capillary: 109 mg/dL — ABNORMAL HIGH (ref 70–99)

## 2013-01-01 LAB — BASIC METABOLIC PANEL
Calcium: 7.5 mg/dL — ABNORMAL LOW (ref 8.4–10.5)
GFR calc Af Amer: 52 mL/min — ABNORMAL LOW (ref >90)
GFR calc non Af Amer: 45 mL/min — ABNORMAL LOW (ref >90)
Glucose, Bld: 129 mg/dL — ABNORMAL HIGH (ref 70–99)
Sodium: 139 mEq/L (ref 135–145)

## 2013-01-01 LAB — CBC
Hemoglobin: 9 g/dL — ABNORMAL LOW (ref 13.0–17.0)
MCH: 30.4 pg (ref 26.0–34.0)
MCHC: 34.7 g/dL (ref 30.0–36.0)
Platelets: 232 10*3/uL (ref 150–400)
RDW: 14.1 % (ref 11.5–15.5)

## 2013-01-01 LAB — HEMOGLOBIN AND HEMATOCRIT, BLOOD: HCT: 28.7 % — ABNORMAL LOW (ref 39.0–52.0)

## 2013-01-01 MED ORDER — SODIUM CHLORIDE 0.9 % IV BOLUS (SEPSIS)
10.0000 mL | Freq: Once | INTRAVENOUS | Status: AC
  Administered 2013-01-01: 10 mL via INTRAVENOUS

## 2013-01-01 MED ORDER — BOOST / RESOURCE BREEZE PO LIQD
1.0000 | Freq: Three times a day (TID) | ORAL | Status: DC
  Administered 2013-01-01 – 2013-01-06 (×11): 1 via ORAL

## 2013-01-01 MED ORDER — POTASSIUM CHLORIDE IN NACL 20-0.9 MEQ/L-% IV SOLN
INTRAVENOUS | Status: AC
  Administered 2013-01-01: 09:00:00 via INTRAVENOUS

## 2013-01-01 MED ORDER — CIPROFLOXACIN HCL 250 MG PO TABS
500.0000 mg | ORAL_TABLET | Freq: Two times a day (BID) | ORAL | Status: DC
  Administered 2013-01-01 – 2013-01-06 (×11): 500 mg via ORAL
  Filled 2013-01-01 (×11): qty 2

## 2013-01-01 MED ORDER — POTASSIUM CHLORIDE CRYS ER 20 MEQ PO TBCR
40.0000 meq | EXTENDED_RELEASE_TABLET | Freq: Once | ORAL | Status: AC
  Administered 2013-01-01: 40 meq via ORAL
  Filled 2013-01-01: qty 2

## 2013-01-01 NOTE — Progress Notes (Addendum)
INITIAL NUTRITION ASSESSMENT  DOCUMENTATION CODES Per approved criteria  -Not Applicable   INTERVENTION:  Resource Breeze po TID, each supplement provides 250 kcal and 9 grams of protein. ProStat 30 ml TID (each 30 ml provides 100 kcal, 15 gr protein)  Obtain current wt to evaluate % change  NUTRITION DIAGNOSIS: Inadequate oral intake related to decreased appetite as evidenced by pt reported "has no taste for food", and meal intake since admission 0-25% of most meals (day 6)   Goal: Pt to meet >/= 90% of their estimated nutrition needs   Monitor:  Po intake, labs and wt trends  Reason for Assessment: Assess nutrition status  79 y.o. male  ASSESSMENT: Patient Active Problem List   Diagnosis Date Noted  . Anemia 01/01/2013  . Metabolic acidosis, increased anion gap (IAG) 12/31/2012  . Acute renal failure 12/31/2012  . Leukocytosis, unspecified 12/31/2012  . Bladder stone 12/09/2012  . Unspecified essential hypertension 12/06/2012  . Thrombocytopenia, unspecified 12/05/2012  . Hypoglycemia associated with diabetes 12/04/2012  . Hypokalemia 12/04/2012  . Urinary retention 12/04/2012  . Rhabdomyolysis 12/04/2012  . Hematuria 12/03/2012  . Constipation 03/22/2012  . Fall at home 03/22/2012  . DM (diabetes mellitus), type 2, uncontrolled 06/15/2011  . Dysarthria 06/15/2011  . Acute sinusitis 06/15/2011  . Dehydration 06/15/2011   Pt lives alone. He reports his appetite has been poor for some time.He says he receives frozen meals every other week (meals on wheels) and uses those as his primary source of nutrition. He doesn't use supplements due to the cost.  Increased weakness, and dehydrated on admission and has transitional cell carcinoma. TURP is pending.  Pt po intake is very poor currently (food and beverages). Observed meal intake <25%. Suspect malnutrition unable to confirm. Will obtain re-wt and assess significant change. Pt is agreeable to receive a "juice type  supplement". Consider trial of appetite stimulant.  Height: Ht Readings from Last 1 Encounters:  12/26/12 5\' 6"  (1.676 m)    Weight: Wt Readings from Last 1 Encounters:  12/26/12 130 lb 14.4 oz (59.376 kg)    Ideal Body Weight: 136# (61.8kg)  % Ideal Body Weight: 96%  Wt Readings from Last 10 Encounters:  12/26/12 130 lb 14.4 oz (59.376 kg)  12/16/12 153 lb (69.4 kg)  12/12/12 153 lb 14.1 oz (69.8 kg)  12/12/12 153 lb 14.1 oz (69.8 kg)  12/12/12 153 lb 14.1 oz (69.8 kg)  03/21/12 130 lb 1.1 oz (59 kg)  12/26/11 129 lb 3 oz (58.6 kg)  06/27/11 131 lb (59.421 kg)  06/15/11 123 lb 10.9 oz (56.1 kg)   Usual Body Weight:  130# ??  % Usual Body Weight: 100%  BMI:  Body mass index is 21.14 kg/(m^2).normal   Estimated Nutritional Needs: Kcal: 1700-2065 Protein: 70-80 gr Fluid: 1800 ml/day  Skin: No issues noted  Diet Order: Carb Control 0-25% most meals since admission  EDUCATION NEEDS: -No education needs identified at this time  No intake or output data in the 24 hours ending 01/01/13 1726  Last BM: 01/01/13  Labs:   Recent Labs Lab 12/30/12 0526 12/31/12 0526 01/01/13 0456  NA 139 136 139  K 3.3* 3.7 3.1*  CL 110 103 107  CO2 21 15* 23  BUN 20 21 18   CREATININE 1.36* 1.31 1.41*  CALCIUM 7.7* 8.2* 7.5*  GLUCOSE 135* 240* 129*    CBG (last 3)   Recent Labs  01/01/13 0725 01/01/13 1134 01/01/13 1618  GLUCAP 109* 183* 99  Scheduled Meds: . atorvastatin  10 mg Oral q1800  . ciprofloxacin  500 mg Oral BID  . insulin aspart  0-15 Units Subcutaneous TID WC  . insulin aspart  0-5 Units Subcutaneous QHS  . lisinopril  10 mg Oral Daily    Continuous Infusions: . 0.9 % NaCl with KCl 20 mEq / L 100 mL/hr at 01/01/13 1348    Past Medical History  Diagnosis Date  . Diabetes mellitus   . Inner ear inflammation   . Vertigo   . Urinary retention 12/04/2012  . Bladder stone 12/09/2012    S/p CYSTOSCOPY WITH LITHOLAPAXY (N/A)  . Hypertension      Past Surgical History  Procedure Laterality Date  . Cystoscopy N/A 12/08/2012    Procedure: CYSTOSCOPY FLEXIBLE;  Surgeon: Ky Barban, MD;  Location: [facility];  Service: Urology;  Laterality: N/A;  . Cystoscopy with litholapaxy N/A 12/09/2012    Procedure: CYSTOSCOPY WITH LITHOLAPAXY;  Surgeon: Ky Barban, MD;  Location: [facility];  Service: Urology;  Laterality: N/A;  . Holmium laser application N/A 12/09/2012    Procedure: HOLMIUM LASER APPLICATION;  Surgeon: Ky Barban, MD;  Location: [facility];  Service: Urology;  Laterality: N/A;    Royann Shivers MS,RD,LDN,CSG Office: [phone] Pager: [phone]

## 2013-01-01 NOTE — Progress Notes (Signed)
PHARMACIST - PHYSICIAN COMMUNICATION DR:   Waymon Amato CONCERNING: Antibiotic IV to Oral Route Change Policy  RECOMMENDATION: This patient is receiving Cipro by the intravenous route.  Based on criteria approved by the Pharmacy and Therapeutics Committee, the antibiotic(s) is/are being converted to the equivalent oral dose form(s).   DESCRIPTION: These criteria include:  Patient being treated for a respiratory tract infection, urinary tract infection, cellulitis or clostridium difficile associated diarrhea if on metronidazole  The patient is not neutropenic and does not exhibit a GI malabsorption state  The patient is eating (either orally or via tube) and/or has been taking other orally administered medications for a least 24 hours  The patient is improving clinically and has a Tmax < 100.5  If you have questions about this conversion, please contact the Pharmacy Department  [x]   [phone] )  [name] []   [phone] )  [facility]  []   [phone] )  [facility] []   [phone] )  [facility]   Junita Push, PharmD, BCPS 01/01/2013@8 :22 AM

## 2013-01-01 NOTE — Progress Notes (Signed)
[facility] PROGRESS NOTE  Charles Gibbs ZOX:[id number] DOB: Jun 30, 1927 DOA: 12/26/2012 PCP: Rudi Heap, MD  Brief narrative 79 year old male patient with history of DM 2, chronic indwelling Foley catheter for urinary retention, HTN was admitted on 12/26/12 with generalized weakness and a possible fall. He recently underwent litholapaxy for large bladder stone and urinary retention. 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 He is due to have TURP by urology. During recent admission in July, patient declined SNF. He was admitted for dehydration, UTI and fall at home and was treated with IV fluids and Rocephin. On 8/11, patient started having Jd hematuria and urology was consulted. Urology plans surgery on 8/18 if he is still in the hospital, otherwise can return OP for procedure.  Assessment/Plan: 1. Gross Hematuria/high-grade transitional cell carcinoma/urinary retention: Possibly from local extension of transitional cell carcinoma. Patient continues to ooze Jahaziel red blood around the Foley catheter. Urology is following & discussed with Dr. Jerre Simon on 8/13-plans for surgery on Monday. DCed antiplatelets & heparin. Repeated urine cultures x 3: Negative. Antibiotics were changed from Rocephin to Cipro per urology recommendations to cover for possible prostatitis (had Escherichia coli and Proteus on urine culture in July). 2. Anemia: hemoglobin has dropped from 12.3 > 9 g in last 24 hours. Do not believe he has that much hematuria to account for this drop. We'll repeat CBCs. 3. Hypokalemia: Possibly from bicarbonate drip. Replete and follow BMP in a.m. 4. High anion gap metabolic acidosis: Unclear etiology. ? Secondary to renal insufficiency but creatinine not significantly elevated. Status post bicarbonate infusion-acidosis resolved. DC bicarbonate drip. Follow BMP in a.m. 5. Leukocytosis:? Stress margination.? Prostatitis-changed to Cipro.resolved. 6. Acute renal  failure:? dehydration. IV fluids and follow BMP. No obstruction on CT. Creatinine slightly higher compared to last 48 hours. Continue IV fluids and follow BMP in a.m. 7. Uncontrolled type II DM: SSI. Reasonable inpatient control. 8. HTN: on ACEI. Controlled. If creatinine continues to rise, may have to DC lisinopril and use alternate agents i.e. amlodipine.   Code Status:  Full Family Communication:  None today. Disposition Plan:  Patient declines SNF. Not medically ready for discharge today.   Consultants:   Urology  Procedures:   Indwelling Foley catheter  Antibiotics:   IV Rocephin 8/8 > 8/13   Cipro 8/13 >  HPI/Subjective: Poor appetite. Denies complaints. Unchanged mild constant oozing of blood around the Foley catheter. No pain  Objective: Filed Vitals:   12/31/12 1429 12/31/12 2048 01/01/13 0500 01/01/13 0909  BP: 177/74 149/76 141/58 134/66  Pulse: 89 99 85   Temp: 97.7 F (36.5 C) 98 F (36.7 C) 98.3 F (36.8 C)   TempSrc: Oral Oral Oral   Resp: 18 20 17    Height:      Weight:      SpO2: 99% 95% 98%     Intake/Output Summary (Last 24 hours) at 01/01/13 1015 Last data filed at 12/31/12 1700  Gross per 24 hour  Intake    160 ml  Output      0 ml  Net    160 ml   Filed Weights   12/26/12 1225 12/26/12 1848  Weight: 59.421 kg (131 lb) 59.376 kg (130 lb 14.4 oz)    Exam:   General exam: Comfortable. Cachectic and frail. Chronically ill-looking.  Respiratory system: Clear. No increased work of breathing.  Cardiovascular system: S1 & S2 heard, RRR. No JVD, murmurs, gallops, clicks or pedal edema.  Gastrointestinal system: Abdomen is  non- distended , soft  and nontender. Normal bowel sounds heard. Foley catheter with oozing of bright red blood around the catheter.   Central nervous system: Alert and oriented. No focal neurological deficits.  Extremities: Symmetric 5 x 5 power.   Data Reviewed: Basic Metabolic Panel:  Recent Labs Lab  12/27/12 0555 12/28/12 0544 12/30/12 0526 12/31/12 0526 01/01/13 0456  NA 140 140 139 136 139  K 3.6 3.5 3.3* 3.7 3.1*  CL 108 111 110 103 107  CO2 19 20 21  15* 23  GLUCOSE 88 133* 135* 240* 129*  BUN 32* 26* 20 21 18   CREATININE 1.31 1.21 1.36* 1.31 1.41*  CALCIUM 7.6* 7.6* 7.7* 8.2* 7.5*   Liver Function Tests:  Recent Labs Lab 12/26/12 1438 12/27/12 0555 12/30/12 0526  AST 14 13 12   ALT 6 6 <5  ALKPHOS 54 45 41  BILITOT 0.4 0.2* 0.2*  PROT 6.3 5.4* 5.1*  ALBUMIN 2.6* 2.2* 2.1*   No results found for this basename: LIPASE, AMYLASE,  in the last 168 hours No results found for this basename: AMMONIA,  in the last 168 hours CBC:  Recent Labs Lab 12/26/12 1438 12/27/12 0555 12/28/12 0544 12/30/12 0526 12/31/12 0526 01/01/13 0456  WBC 12.6* 8.3 7.8 7.8 17.3* 7.4  NEUTROABS 10.4*  --   --   --   --   --   HGB 12.9* 11.1* 11.0* 10.7* 12.3* 9.0*  HCT 38.9* 33.1* 31.9* 31.6* 37.0* 25.9*  MCV 90.0 89.5 88.9 88.8 89.8 87.5  PLT 325 285 267 256 306 232   Cardiac Enzymes: No results found for this basename: CKTOTAL, CKMB, CKMBINDEX, TROPONINI,  in the last 168 hours BNP (last 3 results) No results found for this basename: PROBNP,  in the last 8760 hours CBG:  Recent Labs Lab 12/31/12 0731 12/31/12 1206 12/31/12 1713 12/31/12 2044 01/01/13 0725  GLUCAP 282* 141* 125* 131* 109*    Recent Results (from the past 240 hour(s))  URINE CULTURE     Status: None   Collection Time    12/26/12  3:12 PM      Result Value Range Status   Specimen Description URINE, CLEAN CATCH   Final   Special Requests NONE   Final   Culture  Setup Time     Final   Value: 12/27/2012 00:18     Performed at [facility] Count     Final   Value: NO GROWTH     Performed at [facility]   Culture     Final   Value: NO GROWTH     Performed at [facility]   Report Status 12/28/2012 FINAL   Final  URINE CULTURE     Status: None   Collection Time     12/29/12 11:45 AM      Result Value Range Status   Specimen Description URINE, CATHETERIZED   Final   Special Requests NONE   Final   Culture  Setup Time     Final   Value: 12/29/2012 17:50     Performed at [facility] Count     Final   Value: NO GROWTH     Performed at [facility]   Culture     Final   Value: NO GROWTH     Performed at [facility]   Report Status 12/30/2012 FINAL   Final     Studies: Ct Abdomen Pelvis W Wo Contrast  12/30/2012   *RADIOLOGY REPORT*  Clinical Data: 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  History of diabetes and hypertension.  CT ABDOMEN AND PELVIS WITHOUT AND WITH CONTRAST  Technique:  Multidetector CT imaging of the abdomen and pelvis was performed without contrast material in one or both body regions, followed by contrast material(s) and further sections in one or both body regions.  Contrast: OMNIPAQUE IOHEXOL 300 MG/ML  SOLN  Comparison: Renal ultrasound 12/03/2012.  Findings: There are moderate sized dependent pleural effusions bilaterally with associated bibasilar atelectasis.  Coronary artery calcifications are noted.  Precontrast images demonstrate marked atrophy of the left kidney with multiple large calyceal calculi in its lower pole and interpolar region.  No right-sided renal or ureteral calculi are demonstrated.  Postcontrast, the right kidney enhances normally and demonstrates no evidence of mass or urothelial lesion.  There is limited excretion from the left kidney.  No left renal mass is seen. There is no evidence of ureteral obstruction.  A Foley catheter is in place.  There is air within the bladder. There is irregular bladder wall thickening and perivesical soft tissue stranding.  There are asymmetric calcifications within the right lobe of the prostate gland surrounding an irregular low density mass or fluid collection, measuring 2.7 x 1.7 cm on image 81 of series  6.  The postcontrast images also demonstrate heterogeneous enhancement surrounding the prostatic urethra.  The seminal vesicles appear normal. Some asymmetric enhancement extends along the vas deferens on the left.  There is a preserved fat plane between the rectum and the prostate gland.  No enlarged abdominal pelvic lymph nodes are seen.  The liver, gallbladder, pancreas, spleen and adrenal glands appear normal.  There is moderate aorto iliac atherosclerosis without aneurysm or large vessel occlusion.  No bowel abnormalities are identified. There is bilateral flank edema, worse on the right.  There is diffuse thoracolumbar spondylosis.  IMPRESSION:  1.  Irregular periurethral enhancement extending asymmetrically into the right prostatic lobe concerning for local extension of transitional cell carcinoma.  No direct invasion of the adjacent pelvic structures is identified.  There is some peripheral enhancement extending along the vas deferens on the left.  2.  Nonspecific bladder wall thickening and perivesical soft tissue stranding.  No pelvic adenopathy or ureteral obstruction identified. 3.  Chronic left renal atrophy with multiple calyceal calculi.  The right kidney demonstrates no significant findings. 4.  No evidence of metastatic disease. 5.  Flank edema with moderate bilateral pleural effusions.   Original Report Authenticated By: Carey Bullocks, M.D.     Additional labs:   Scheduled Meds: . atorvastatin  10 mg Oral q1800  . ciprofloxacin  500 mg Oral BID  . insulin aspart  0-15 Units Subcutaneous TID WC  . insulin aspart  0-5 Units Subcutaneous QHS  . lisinopril  10 mg Oral Daily   Continuous Infusions: . 0.9 % NaCl with KCl 20 mEq / L 50 mL/hr at 01/01/13 0908    Active Problems:   DM (diabetes mellitus), type 2, uncontrolled   Dehydration   Fall at home   Hematuria   Hypokalemia   Urinary retention   Unspecified essential hypertension   Metabolic acidosis, increased anion gap  (IAG)   Acute renal failure   Leukocytosis, unspecified    Time spent: 25 minutes.    [facility]  [facility] Pager [phone].   If 8PM-8AM, please contact night-coverage at [email or web address], password [facility] 01/01/2013, 10:15 AM  LOS: 6 days

## 2013-01-02 DIAGNOSIS — I10 Essential (primary) hypertension: Secondary | ICD-10-CM

## 2013-01-02 DIAGNOSIS — N179 Acute kidney failure, unspecified: Secondary | ICD-10-CM

## 2013-01-02 LAB — CBC
HCT: 26.6 % — ABNORMAL LOW (ref 39.0–52.0)
Hemoglobin: 9.1 g/dL — ABNORMAL LOW (ref 13.0–17.0)
MCH: 30.3 pg (ref 26.0–34.0)
MCHC: 34.2 g/dL (ref 30.0–36.0)
MCV: 88.7 fL (ref 78.0–100.0)
RDW: 14.7 % (ref 11.5–15.5)

## 2013-01-02 LAB — BASIC METABOLIC PANEL
BUN: 16 mg/dL (ref 6–23)
Calcium: 7.6 mg/dL — ABNORMAL LOW (ref 8.4–10.5)
Creatinine, Ser: 1.61 mg/dL — ABNORMAL HIGH (ref 0.50–1.35)
GFR calc non Af Amer: 38 mL/min — ABNORMAL LOW (ref >90)
Glucose, Bld: 120 mg/dL — ABNORMAL HIGH (ref 70–99)
Potassium: 3.9 mEq/L (ref 3.5–5.1)

## 2013-01-02 LAB — GLUCOSE, CAPILLARY
Glucose-Capillary: 123 mg/dL — ABNORMAL HIGH (ref 70–99)
Glucose-Capillary: 142 mg/dL — ABNORMAL HIGH (ref 70–99)

## 2013-01-02 MED ORDER — AMLODIPINE BESYLATE 5 MG PO TABS
10.0000 mg | ORAL_TABLET | Freq: Every day | ORAL | Status: DC
  Administered 2013-01-03 – 2013-01-06 (×4): 10 mg via ORAL
  Filled 2013-01-02 (×4): qty 2

## 2013-01-02 MED ORDER — ADULT MULTIVITAMIN W/MINERALS CH
1.0000 | ORAL_TABLET | Freq: Every day | ORAL | Status: DC
  Administered 2013-01-02 – 2013-01-06 (×5): 1 via ORAL
  Filled 2013-01-02 (×5): qty 1

## 2013-01-02 MED ORDER — POTASSIUM CHLORIDE IN NACL 20-0.9 MEQ/L-% IV SOLN
INTRAVENOUS | Status: AC
  Administered 2013-01-02: 18:00:00 via INTRAVENOUS

## 2013-01-02 MED ORDER — PRO-STAT SUGAR FREE PO LIQD
30.0000 mL | Freq: Three times a day (TID) | ORAL | Status: DC
  Administered 2013-01-02 – 2013-01-06 (×12): 30 mL via ORAL
  Filled 2013-01-02 (×12): qty 30

## 2013-01-02 NOTE — Progress Notes (Signed)
Pt's catheter irrigated.  Immediate return of 925 ml of urine.  Pt states feels some relief.  Urine dark red.

## 2013-01-02 NOTE — Progress Notes (Signed)
Pt's order for IV fluids expired.  Dr. Waymon Amato notified via text page.

## 2013-01-02 NOTE — Progress Notes (Signed)
PT Cancellation Note  Patient Details Name: Charles Gibbs MRN: [id number] DOB: June 19, 1927   Cancelled Treatment:    Reason Eval/Treat Not Completed: Fatigue/lethargy limiting ability to participate Pt unable to stay awake.  He states that he is too weak to even stand.  When I spoke with him about the necessity of improving his strength if he desires to go home, he just brushed me off.  It doesn't appear that to me that he will have any chance at discharging to home alone at d/c.  Myrlene Broker L 01/02/2013, 4:50 PM

## 2013-01-02 NOTE — Progress Notes (Signed)
Dr. Waymon Amato notified via text page of patient's decreased output earlier today with catheter irrigation and 925 ml of return.

## 2013-01-02 NOTE — Progress Notes (Signed)
Order for IV fluids expired.  Dr. Waymon Amato notified via text page and asked about further orders.

## 2013-01-02 NOTE — Progress Notes (Addendum)
[facility] PROGRESS NOTE  Charles Gibbs:[id number] DOB: 1927-07-19 DOA: 12/26/2012 PCP: Rudi Heap, MD  Brief narrative 79 year old male patient with history of DM 2, chronic indwelling Foley catheter for urinary retention, HTN was admitted on 12/26/12 with generalized weakness and a possible fall. He recently underwent litholapaxy for large bladder stone and urinary retention. 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 He is due to have TURP by urology. During recent admission in July, patient declined SNF. He was admitted for dehydration, UTI and fall at home and was treated with IV fluids and Rocephin. On 8/11, patient started having Sara hematuria and urology was consulted. Urology plans surgery on 8/18 if he is still in the hospital, otherwise can return OP for procedure.  Assessment/Plan: 1. Gross Hematuria/high-grade transitional cell carcinoma/urinary retention: Possibly from local extension of transitional cell carcinoma. Patient continues to ooze Tonnie red blood around the Foley catheter. Urology is following & discussed with Dr. Jerre Simon on 8/13-plans for surgery on Monday. DCed antiplatelets & heparin. Repeated urine cultures x 3: Negative. Antibiotics were changed from Rocephin to Cipro per urology recommendations to cover for possible prostatitis (had Escherichia coli and Proteus on urine culture in July). Bleeding seems to have stopped today. 2. Anemia: Stable in the 9 g/dL range over the last 48 hours. Follow up CBC in a.m. Transfuse if hemoglobin less than 7 g per DL. 3. Hypokalemia: Possibly from bicarbonate drip. Repleted 4. High anion gap metabolic acidosis: Unclear etiology. ? Secondary to renal insufficiency but creatinine not significantly elevated. Status post bicarbonate infusion-acidosis resolved. DCed bicarbonate drip.  5. Leukocytosis:? Stress margination.? Prostatitis-changed to Cipro.resolved. 6. Acute renal failure: IV fluids and follow  BMP. No obstruction on CT. Creatinine continues to gradually rise-? Obstruction-per urology on Monday. DC lisinopril. Patient apparently had no output from catheter today but was irrigated and got 925 ML. So increasing creatinine may be secondary to intermittent urinary retention. 7. Uncontrolled type II DM: SSI. Reasonable inpatient control. 8. HTN: UnControlled. If creatinine continues to rise, may have to DC lisinopril and use alternate agents i.e. amlodipine.   Code Status:  Full Family Communication:  None today. Disposition Plan:  Patient lives alone, does not have 24/7 supervision per case management note, refuses SNF, will be unsafe to return home by himself do to weakness and deconditioning. Prefer that he stays over the weekend to have his urological procedure on Monday.   Consultants:   Urology  Procedures:   Indwelling Foley catheter  Antibiotics:   IV Rocephin 8/8 > 8/13   Cipro 8/13 >  HPI/Subjective: Denies complaints. Appetite better today.  Objective: Filed Vitals:   01/01/13 1547 01/01/13 2057 01/02/13 0657 01/02/13 1428  BP: 144/71 173/78 157/70 196/75  Pulse: 83  85 92  Temp: 98.5 F (36.9 C) 98.2 F (36.8 C) 97.9 F (36.6 C) 98.1 F (36.7 C)  TempSrc: Oral Oral Oral   Resp: 20 18 16 18   Height:      Weight:      SpO2: 96% 98% 98% 98%    Intake/Output Summary (Last 24 hours) at 01/02/13 1750 Last data filed at 01/02/13 1448  Gross per 24 hour  Intake    330 ml  Output    925 ml  Net   -595 ml   Filed Weights   12/26/12 1225 12/26/12 1848  Weight: 59.421 kg (131 lb) 59.376 kg (130 lb 14.4 oz)    Exam:   General exam: Comfortable. Cachectic and frail. Chronically ill-looking.  Respiratory  system: Clear. No increased work of breathing.  Cardiovascular system: S1 & S2 heard, RRR. No JVD, murmurs, gallops, clicks or pedal edema.  Gastrointestinal system: Abdomen is  non- distended , soft and nontender. Normal bowel sounds heard. Foley  catheter with no bleeding around catheter.   Central nervous system: Alert and oriented. No focal neurological deficits.  Extremities: Symmetric 5 x 5 power.   Data Reviewed: Basic Metabolic Panel:  Recent Labs Lab 12/28/12 0544 12/30/12 0526 12/31/12 0526 01/01/13 0456 01/02/13 0618  NA 140 139 136 139 139  K 3.5 3.3* 3.7 3.1* 3.9  CL 111 110 103 107 108  CO2 20 21 15* 23 23  GLUCOSE 133* 135* 240* 129* 120*  BUN 26* 20 21 18 16   CREATININE 1.21 1.36* 1.31 1.41* 1.61*  CALCIUM 7.6* 7.7* 8.2* 7.5* 7.6*   Liver Function Tests:  Recent Labs Lab 12/27/12 0555 12/30/12 0526  AST 13 12  ALT 6 <5  ALKPHOS 45 41  BILITOT 0.2* 0.2*  PROT 5.4* 5.1*  ALBUMIN 2.2* 2.1*   No results found for this basename: LIPASE, AMYLASE,  in the last 168 hours No results found for this basename: AMMONIA,  in the last 168 hours CBC:  Recent Labs Lab 12/28/12 0544 12/30/12 0526 12/31/12 0526 01/01/13 0456 01/01/13 1725 01/02/13 0618  WBC 7.8 7.8 17.3* 7.4  --  7.3  HGB 11.0* 10.7* 12.3* 9.0* 9.9* 9.1*  HCT 31.9* 31.6* 37.0* 25.9* 28.7* 26.6*  MCV 88.9 88.8 89.8 87.5  --  88.7  PLT 267 256 306 232  --  209   Cardiac Enzymes: No results found for this basename: CKTOTAL, CKMB, CKMBINDEX, TROPONINI,  in the last 168 hours BNP (last 3 results) No results found for this basename: PROBNP,  in the last 8760 hours CBG:  Recent Labs Lab 01/01/13 1618 01/01/13 2052 01/02/13 0724 01/02/13 1145 01/02/13 1652  GLUCAP 99 130* 123* 190* 142*    Recent Results (from the past 240 hour(s))  URINE CULTURE     Status: None   Collection Time    12/26/12  3:12 PM      Result Value Range Status   Specimen Description URINE, CLEAN CATCH   Final   Special Requests NONE   Final   Culture  Setup Time     Final   Value: 12/27/2012 00:18     Performed at [facility] Count     Final   Value: NO GROWTH     Performed at [facility]   Culture     Final   Value: NO  GROWTH     Performed at [facility]   Report Status 12/28/2012 FINAL   Final  URINE CULTURE     Status: None   Collection Time    12/29/12 11:45 AM      Result Value Range Status   Specimen Description URINE, CATHETERIZED   Final   Special Requests NONE   Final   Culture  Setup Time     Final   Value: 12/29/2012 17:50     Performed at [facility] Count     Final   Value: NO GROWTH     Performed at [facility]   Culture     Final   Value: NO GROWTH     Performed at [facility]   Report Status 12/30/2012 FINAL   Final     Studies: No results found.  Additional labs:   Scheduled Meds: . atorvastatin  10 mg Oral q1800  . ciprofloxacin  500 mg Oral BID  . feeding supplement  30 mL Oral TID WC  . feeding supplement  1 Container Oral TID BM  . insulin aspart  0-15 Units Subcutaneous TID WC  . insulin aspart  0-5 Units Subcutaneous QHS  . lisinopril  10 mg Oral Daily  . multivitamin with minerals  1 tablet Oral Daily   Continuous Infusions: . 0.9 % NaCl with KCl 20 mEq / L      Active Problems:   DM (diabetes mellitus), type 2, uncontrolled   Dehydration   Fall at home   Hematuria   Hypokalemia   Urinary retention   Unspecified essential hypertension   Metabolic acidosis, increased anion gap (IAG)   Acute renal failure   Leukocytosis, unspecified   Anemia    Time spent: 25 minutes.    [facility]  [facility] Pager [phone].   If 8PM-8AM, please contact night-coverage at [email or web address], password [facility] 01/02/2013, 5:50 PM  LOS: 7 days

## 2013-01-03 DIAGNOSIS — D72829 Elevated white blood cell count, unspecified: Secondary | ICD-10-CM

## 2013-01-03 DIAGNOSIS — N179 Acute kidney failure, unspecified: Secondary | ICD-10-CM

## 2013-01-03 DIAGNOSIS — D649 Anemia, unspecified: Secondary | ICD-10-CM

## 2013-01-03 DIAGNOSIS — E872 Acidosis: Secondary | ICD-10-CM

## 2013-01-03 DIAGNOSIS — E876 Hypokalemia: Secondary | ICD-10-CM

## 2013-01-03 DIAGNOSIS — R319 Hematuria, unspecified: Secondary | ICD-10-CM

## 2013-01-03 LAB — GLUCOSE, CAPILLARY
Glucose-Capillary: 120 mg/dL — ABNORMAL HIGH (ref 70–99)
Glucose-Capillary: 122 mg/dL — ABNORMAL HIGH (ref 70–99)

## 2013-01-03 LAB — BASIC METABOLIC PANEL
BUN: 21 mg/dL (ref 6–23)
Creatinine, Ser: 1.48 mg/dL — ABNORMAL HIGH (ref 0.50–1.35)
GFR calc Af Amer: 49 mL/min — ABNORMAL LOW (ref >90)
GFR calc non Af Amer: 42 mL/min — ABNORMAL LOW (ref >90)
Potassium: 3.8 mEq/L (ref 3.5–5.1)

## 2013-01-03 LAB — CBC
HCT: 25.6 % — ABNORMAL LOW (ref 39.0–52.0)
MCHC: 33.2 g/dL (ref 30.0–36.0)
Platelets: 186 10*3/uL (ref 150–400)
RDW: 15.1 % (ref 11.5–15.5)

## 2013-01-03 MED ORDER — TRAZODONE HCL 50 MG PO TABS
25.0000 mg | ORAL_TABLET | Freq: Every evening | ORAL | Status: DC | PRN
  Administered 2013-01-04: 25 mg via ORAL
  Filled 2013-01-03: qty 1

## 2013-01-03 NOTE — Progress Notes (Signed)
[facility] PROGRESS NOTE  MACKAY HANAUER XBJ:[id number] DOB: Aug 05, 1927 DOA: 12/26/2012 PCP: Rudi Heap, MD  Assessment/Plan: Active Problems:   DM (diabetes mellitus), type 2, uncontrolled   Dehydration   Fall at home   Hematuria   Hypokalemia   Urinary retention   Unspecified essential hypertension   Metabolic acidosis, increased anion gap (IAG)   Acute renal failure   Leukocytosis, unspecified   Anemia     Brief narrative  79 year old male patient with history of DM 2, chronic indwelling Foley catheter for urinary retention, HTN was admitted on 12/26/12 with generalized weakness and a possible fall. He recently underwent litholapaxy for large bladder stone and urinary retention. 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 He is due to have TURP by urology. During recent admission in July, patient declined SNF. He was admitted for dehydration, UTI and fall at home and was treated with IV fluids and Rocephin. On 8/11, patient started having Nichole hematuria and urology was consulted. Urology plans surgery on 8/18 if he is still in the hospital, otherwise can return OP for procedure.  Assessment/Plan:  1. Gross Hematuria/high-grade transitional cell carcinoma/urinary retention: Possibly from local extension of transitional cell carcinoma. Patient continues to ooze Biagio red blood around the Foley catheter. Urology is following & discussed with Dr. Jerre Simon on 8/13-plans for surgery on Monday. DCed antiplatelets & heparin. Repeated urine cultures x 3: Negative. Antibiotics were changed from Rocephin to Cipro per urology recommendations to cover for possible prostatitis (had Escherichia coli and Proteus on urine culture in July). Bleeding seems to have stopped today. 2. Anemia: Dropped to 8.5. Follow up CBC in a.m. may need a transfusion before surgery 3. Hypokalemia: Possibly from bicarbonate drip. Repleted 4. High anion gap metabolic acidosis: Unclear etiology. ?  Secondary to renal insufficiency but creatinine not significantly elevated. Status post bicarbonate infusion-acidosis resolved. DCed bicarbonate drip.  5. Leukocytosis:? Stress margination.? Prostatitis-changed to Cipro.resolved. 6. Acute renal failure: IV fluids and follow BMP. No obstruction on CT. Creatinine continues to gradually rise-? Obstruction-per urology on Monday. DC lisinopril. Patient apparently had no output from catheter today but was irrigated and got 925 ML. So increasing creatinine may be secondary to intermittent urinary retention. 7. Uncontrolled type II DM: SSI. Reasonable inpatient control. 8. HTN: UnControlled. If creatinine continues to rise, may have to DC lisinopril and use alternate agents i.e. amlodipine. 9.  Code Status: Full  Family Communication: None today.  Disposition Plan: Patient lives alone, does not have 24/7 supervision per case management note, refuses SNF, will be unsafe to return home by himself do to weakness and deconditioning. Prefer that he stays over the weekend to have his urological procedure on Monday.   Consultants:  Urology Procedures:  Indwelling Foley catheter Antibiotics:  IV Rocephin 8/8 > 8/13  Cipro 8/13 > HPI/Subjective:  Denies complaints. Appetite better today.   Objective: Filed Vitals:   01/02/13 0657 01/02/13 1428 01/02/13 2240 01/03/13 0646  BP: 157/70 196/75 146/70 165/82  Pulse: 85 92 80 88  Temp: 97.9 F (36.6 C) 98.1 F (36.7 C) 98 F (36.7 C) 97.6 F (36.4 C)  TempSrc: Oral     Resp: 16 18 18 18   Height:      Weight:      SpO2: 98% 98% 99% 98%    Intake/Output Summary (Last 24 hours) at 01/03/13 0730 Last data filed at 01/03/13 0700  Gross per 24 hour  Intake 3300.33 ml  Output   1775 ml  Net 1525.33 ml  Exam: General exam: Comfortable. Seen ambulating with PT. Cachectic and frail. Chronically ill-looking.  Respiratory system: Clear. No increased work of breathing.  Cardiovascular system: S1 & S2  heard, RRR. No JVD, murmurs, gallops, clicks or pedal edema.  Gastrointestinal system: Abdomen is mildly distended , soft and nontender. Normal bowel sounds heard. Foley catheter with oozing of bright red blood around the catheter. Soiling of covering sheets.  Central nervous system: Alert and oriented. No focal neurological deficits.  Extremities: Symmetric 5 x 5 power.    Data Reviewed: Basic Metabolic Panel:  Recent Labs Lab 12/30/12 0526 12/31/12 0526 01/01/13 0456 01/02/13 0618 01/03/13 0558  NA 139 136 139 139 141  K 3.3* 3.7 3.1* 3.9 3.8  CL 110 103 107 108 112  CO2 21 15* 23 23 22   GLUCOSE 135* 240* 129* 120* 137*  BUN 20 21 18 16 21   CREATININE 1.36* 1.31 1.41* 1.61* 1.48*  CALCIUM 7.7* 8.2* 7.5* 7.6* 7.6*    Liver Function Tests:  Recent Labs Lab 12/30/12 0526  AST 12  ALT <5  ALKPHOS 41  BILITOT 0.2*  PROT 5.1*  ALBUMIN 2.1*   No results found for this basename: LIPASE, AMYLASE,  in the last 168 hours No results found for this basename: AMMONIA,  in the last 168 hours  CBC:  Recent Labs Lab 12/30/12 0526 12/31/12 0526 01/01/13 0456 01/01/13 1725 01/02/13 0618 01/03/13 0558  WBC 7.8 17.3* 7.4  --  7.3 6.2  HGB 10.7* 12.3* 9.0* 9.9* 9.1* 8.5*  HCT 31.6* 37.0* 25.9* 28.7* 26.6* 25.6*  MCV 88.8 89.8 87.5  --  88.7 90.8  PLT 256 306 232  --  209 186    Cardiac Enzymes: No results found for this basename: CKTOTAL, CKMB, CKMBINDEX, TROPONINI,  in the last 168 hours BNP (last 3 results) No results found for this basename: PROBNP,  in the last 8760 hours   CBG:  Recent Labs Lab 01/01/13 2052 01/02/13 0724 01/02/13 1145 01/02/13 1652 01/02/13 2101  GLUCAP 130* 123* 190* 142* 203*    Recent Results (from the past 240 hour(s))  URINE CULTURE     Status: None   Collection Time    12/26/12  3:12 PM      Result Value Range Status   Specimen Description URINE, CLEAN CATCH   Final   Special Requests NONE   Final   Culture  Setup Time      Final   Value: 12/27/2012 00:18     Performed at [facility] Count     Final   Value: NO GROWTH     Performed at [facility]   Culture     Final   Value: NO GROWTH     Performed at [facility]   Report Status 12/28/2012 FINAL   Final  URINE CULTURE     Status: None   Collection Time    12/29/12 11:45 AM      Result Value Range Status   Specimen Description URINE, CATHETERIZED   Final   Special Requests NONE   Final   Culture  Setup Time     Final   Value: 12/29/2012 17:50     Performed at [facility] Count     Final   Value: NO GROWTH     Performed at [facility]   Culture     Final   Value: NO GROWTH     Performed at [facility]   Report Status 12/30/2012 FINAL   Final     Studies: Ct Abdomen Pelvis W Wo Contrast  12/30/2012   *RADIOLOGY REPORT*  Clinical Data: 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  History of diabetes and hypertension.  CT ABDOMEN AND PELVIS WITHOUT AND WITH CONTRAST  Technique:  Multidetector CT imaging of the abdomen and pelvis was performed without contrast material in one or both body regions, followed by contrast material(s) and further sections in one or both body regions.  Contrast: OMNIPAQUE IOHEXOL 300 MG/ML  SOLN  Comparison: Renal ultrasound 12/03/2012.  Findings: There are moderate sized dependent pleural effusions bilaterally with associated bibasilar atelectasis.  Coronary artery calcifications are noted.  Precontrast images demonstrate marked atrophy of the left kidney with multiple large calyceal calculi in its lower pole and interpolar region.  No right-sided renal or ureteral calculi are demonstrated.  Postcontrast, the right kidney enhances normally and demonstrates no evidence of mass or urothelial lesion.  There is limited excretion from the left kidney.  No left renal mass is seen. There is no evidence of ureteral obstruction.   A Foley catheter is in place.  There is air within the bladder. There is irregular bladder wall thickening and perivesical soft tissue stranding.  There are asymmetric calcifications within the right lobe of the prostate gland surrounding an irregular low density mass or fluid collection, measuring 2.7 x 1.7 cm on image 81 of series 6.  The postcontrast images also demonstrate heterogeneous enhancement surrounding the prostatic urethra.  The seminal vesicles appear normal. Some asymmetric enhancement extends along the vas deferens on the left.  There is a preserved fat plane between the rectum and the prostate gland.  No enlarged abdominal pelvic lymph nodes are seen.  The liver, gallbladder, pancreas, spleen and adrenal glands appear normal.  There is moderate aorto iliac atherosclerosis without aneurysm or large vessel occlusion.  No bowel abnormalities are identified. There is bilateral flank edema, worse on the right.  There is diffuse thoracolumbar spondylosis.  IMPRESSION:  1.  Irregular periurethral enhancement extending asymmetrically into the right prostatic lobe concerning for local extension of transitional cell carcinoma.  No direct invasion of the adjacent pelvic structures is identified.  There is some peripheral enhancement extending along the vas deferens on the left.  2.  Nonspecific bladder wall thickening and perivesical soft tissue stranding.  No pelvic adenopathy or ureteral obstruction identified. 3.  Chronic left renal atrophy with multiple calyceal calculi.  The right kidney demonstrates no significant findings. 4.  No evidence of metastatic disease. 5.  Flank edema with moderate bilateral pleural effusions.   Original Report Authenticated By: Carey Bullocks, M.D.   Dg Chest Port 1 View  12/26/2012   *RADIOLOGY REPORT*  Clinical Data: Fatigue  PORTABLE CHEST - 1 VIEW  Comparison: 12/03/2012  Findings: Cardiomediastinal silhouette is stable.  No pulmonary edema.  Question trace left pleural  effusion with left basilar atelectasis.  No segmental infiltrate.  IMPRESSION: Question trace left pleural effusion with left basilar atelectasis. No segmental infiltrate.  No pulmonary edema.   Original Report Authenticated By: Natasha Mead, M.D.    Scheduled Meds: . amLODipine  10 mg Oral Daily  . atorvastatin  10 mg Oral q1800  . ciprofloxacin  500 mg Oral BID  . feeding supplement  30 mL Oral TID WC  . feeding supplement  1 Container Oral TID BM  . insulin aspart  0-15 Units Subcutaneous TID WC  .  insulin aspart  0-5 Units Subcutaneous QHS  . multivitamin with minerals  1 tablet Oral Daily   Continuous Infusions: . 0.9 % NaCl with KCl 20 mEq / L 50 mL/hr at 01/02/13 4098    Active Problems:   DM (diabetes mellitus), type 2, uncontrolled   Dehydration   Fall at home   Hematuria   Hypokalemia   Urinary retention   Unspecified essential hypertension   Metabolic acidosis, increased anion gap (IAG)   Acute renal failure   Leukocytosis, unspecified   Anemia    Time spent: 40 minutes   [facility]  [facility] Pager [phone]. If 8PM-8AM, please contact night-coverage at [email or web address], password [facility] 01/03/2013, 7:30 AM  LOS: 8 days

## 2013-01-04 LAB — GLUCOSE, CAPILLARY
Glucose-Capillary: 119 mg/dL — ABNORMAL HIGH (ref 70–99)
Glucose-Capillary: 205 mg/dL — ABNORMAL HIGH (ref 70–99)

## 2013-01-04 LAB — CBC
HCT: 25.9 % — ABNORMAL LOW (ref 39.0–52.0)
Hemoglobin: 8.7 g/dL — ABNORMAL LOW (ref 13.0–17.0)
MCHC: 33.6 g/dL (ref 30.0–36.0)
WBC: 6.2 10*3/uL (ref 4.0–10.5)

## 2013-01-04 NOTE — Progress Notes (Signed)
Dr. Jerre Simon  called to see what time patient was to have Turp, informed that he would be in to see patient later today or in the morning.

## 2013-01-04 NOTE — Progress Notes (Signed)
[facility] PROGRESS NOTE  MARQUET FAIRCLOTH WGN:[id number] DOB: [birth year] DOA: 12/26/2012 PCP: Rudi Heap, MD  Assessment/Plan: Active Problems:   DM (diabetes mellitus), type 2, uncontrolled   Dehydration   Fall at home   Hematuria   Hypokalemia   Urinary retention   Unspecified essential hypertension   Metabolic acidosis, increased anion gap (IAG)   Acute renal failure   Leukocytosis, unspecified   Anemia    *Brief narrative  79 year old male patient with history of DM 2, chronic indwelling Foley catheter for urinary retention, HTN was admitted on 12/26/12 with generalized weakness and a possible fall. He recently underwent litholapaxy for large bladder stone and urinary retention. 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 He is due to have TURP by urology. During recent admission in July, patient declined SNF. He was admitted for dehydration, UTI and fall at home and was treated with IV fluids and Rocephin. On 8/11, patient started having Jaythan hematuria and urology was consulted. Urology plans surgery on 8/18 if he is still in the hospital, otherwise can return OP for procedure.  Assessment/Plan:  1. Gross Hematuria/high-grade transitional cell carcinoma/urinary retention: Possibly from local extension of transitional cell carcinoma. Patient continues to ooze Barclay red blood around the Foley catheter. Urology is following & discussed with Dr. Jerre Simon on 8/13-plans for surgery on Monday. Will make n.p.o. past midnight. DCed antiplatelets & heparin. Repeated urine cultures x 3: Negative. Antibiotics were changed from Rocephin to Cipro per urology recommendations to cover for possible prostatitis (had Escherichia coli and Proteus on urine culture in July). Bleeding seems to have stopped today. 2. Anemia: Dropped to 8.5. Follow up CBC in a.m. may need a transfusion before surgery 3. Hypokalemia: Possibly from bicarbonate drip. Repleted 4. High anion gap metabolic  acidosis: Unclear etiology. ? Secondary to renal insufficiency but creatinine not significantly elevated. Status post bicarbonate infusion-acidosis resolved. DCed bicarbonate drip.  5. Leukocytosis:? Stress margination.? Prostatitis-changed to Cipro.resolved. 6. Acute renal failure: IV fluids and follow BMP. No obstruction on CT. Creatinine continues to gradually rise-? Obstruction-per urology on Monday. DC lisinopril. Patient apparently had no output from catheter today but was irrigated and got 925 ML. So increasing creatinine may be secondary to intermittent urinary retention. 7. Uncontrolled type II DM: SSI. Reasonable inpatient control. 8. HTN: UnControlled. If creatinine continues to rise, may have to DC lisinopril and use alternate agents i.e. Amlodipine.   Code Status: Full  Family Communication: None today.  Disposition Plan: Patient lives alone, does not have 24/7 supervision per case management note, refuses SNF, will be unsafe to return home by himself do to weakness and deconditioning. Prefer that he stays over the weekend to have his urological procedure on Monday.  Consultants:  Urology Procedures:  Indwelling Foley catheter Antibiotics:  IV Rocephin 8/8 > 8/13  Cipro 8/13 > HPI/Subjective:  Denies complaints. Appetite better today.      Objective: Filed Vitals:   01/03/13 0646 01/03/13 1545 01/03/13 2300 01/04/13 0520  BP: 165/82 135/72 154/74 137/71  Pulse: 88 80 69 75  Temp: 97.6 F (36.4 C) 97.9 F (36.6 C) 97.9 F (36.6 C) 98.2 F (36.8 C)  TempSrc:      Resp: 18 16 18 20   Height:      Weight:      SpO2: 98% 99% 97% 98%    Intake/Output Summary (Last 24 hours) at 01/04/13 1148 Last data filed at 01/04/13 0552  Gross per 24 hour  Intake    480 ml  Output  1450 ml  Net   -970 ml    Exam:  HENT:  Head: Atraumatic.  Nose: Nose normal.  Mouth/Throat: Oropharynx is clear and moist.  Eyes: Conjunctivae are normal. Pupils are equal, round, and  reactive to light. No scleral icterus.  Neck: Neck supple. No tracheal deviation present.  Cardiovascular: Normal rate, regular rhythm, normal heart sounds and intact distal pulses.  Pulmonary/Chest: Effort normal and breath sounds normal. No respiratory distress.  Abdominal: Soft. Normal appearance and bowel sounds are normal. She exhibits no distension. There is no tenderness.  Musculoskeletal: She exhibits no edema and no tenderness.  Neurological: She is alert. No cranial nerve deficit.    Data Reviewed: Basic Metabolic Panel:  Recent Labs Lab 12/30/12 0526 12/31/12 0526 01/01/13 0456 01/02/13 0618 01/03/13 0558  NA 139 136 139 139 141  K 3.3* 3.7 3.1* 3.9 3.8  CL 110 103 107 108 112  CO2 21 15* 23 23 22   GLUCOSE 135* 240* 129* 120* 137*  BUN 20 21 18 16 21   CREATININE 1.36* 1.31 1.41* 1.61* 1.48*  CALCIUM 7.7* 8.2* 7.5* 7.6* 7.6*    Liver Function Tests:  Recent Labs Lab 12/30/12 0526  AST 12  ALT <5  ALKPHOS 41  BILITOT 0.2*  PROT 5.1*  ALBUMIN 2.1*   No results found for this basename: LIPASE, AMYLASE,  in the last 168 hours No results found for this basename: AMMONIA,  in the last 168 hours  CBC:  Recent Labs Lab 12/31/12 0526 01/01/13 0456 01/01/13 1725 01/02/13 0618 01/03/13 0558 01/04/13 0604  WBC 17.3* 7.4  --  7.3 6.2 6.2  HGB 12.3* 9.0* 9.9* 9.1* 8.5* 8.7*  HCT 37.0* 25.9* 28.7* 26.6* 25.6* 25.9*  MCV 89.8 87.5  --  88.7 90.8 90.9  PLT 306 232  --  209 186 172    Cardiac Enzymes: No results found for this basename: CKTOTAL, CKMB, CKMBINDEX, TROPONINI,  in the last 168 hours BNP (last 3 results) No results found for this basename: PROBNP,  in the last 8760 hours   CBG:  Recent Labs Lab 01/03/13 1135 01/03/13 1650 01/03/13 2049 01/04/13 0745 01/04/13 1137  GLUCAP 163* 142* 122* 119* 205*    Recent Results (from the past 240 hour(s))  URINE CULTURE     Status: None   Collection Time    12/26/12  3:12 PM      Result Value  Range Status   Specimen Description URINE, CLEAN CATCH   Final   Special Requests NONE   Final   Culture  Setup Time     Final   Value: 12/27/2012 00:18     Performed at [facility] Count     Final   Value: NO GROWTH     Performed at [facility]   Culture     Final   Value: NO GROWTH     Performed at [facility]   Report Status 12/28/2012 FINAL   Final  URINE CULTURE     Status: None   Collection Time    12/29/12 11:45 AM      Result Value Range Status   Specimen Description URINE, CATHETERIZED   Final   Special Requests NONE   Final   Culture  Setup Time     Final   Value: 12/29/2012 17:50     Performed at [facility] Count     Final   Value: NO GROWTH  Performed at [facility]     Final   Value: NO GROWTH     Performed at [facility]   Report Status 12/30/2012 FINAL   Final     Studies: Ct Abdomen Pelvis W Wo Contrast  12/30/2012   *RADIOLOGY REPORT*  Clinical Data: 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  History of diabetes and hypertension.  CT ABDOMEN AND PELVIS WITHOUT AND WITH CONTRAST  Technique:  Multidetector CT imaging of the abdomen and pelvis was performed without contrast material in one or both body regions, followed by contrast material(s) and further sections in one or both body regions.  Contrast: OMNIPAQUE IOHEXOL 300 MG/ML  SOLN  Comparison: Renal ultrasound 12/03/2012.  Findings: There are moderate sized dependent pleural effusions bilaterally with associated bibasilar atelectasis.  Coronary artery calcifications are noted.  Precontrast images demonstrate marked atrophy of the left kidney with multiple large calyceal calculi in its lower pole and interpolar region.  No right-sided renal or ureteral calculi are demonstrated.  Postcontrast, the right kidney enhances normally and demonstrates no evidence of mass or urothelial lesion.   There is limited excretion from the left kidney.  No left renal mass is seen. There is no evidence of ureteral obstruction.  A Foley catheter is in place.  There is air within the bladder. There is irregular bladder wall thickening and perivesical soft tissue stranding.  There are asymmetric calcifications within the right lobe of the prostate gland surrounding an irregular low density mass or fluid collection, measuring 2.7 x 1.7 cm on image 81 of series 6.  The postcontrast images also demonstrate heterogeneous enhancement surrounding the prostatic urethra.  The seminal vesicles appear normal. Some asymmetric enhancement extends along the vas deferens on the left.  There is a preserved fat plane between the rectum and the prostate gland.  No enlarged abdominal pelvic lymph nodes are seen.  The liver, gallbladder, pancreas, spleen and adrenal glands appear normal.  There is moderate aorto iliac atherosclerosis without aneurysm or large vessel occlusion.  No bowel abnormalities are identified. There is bilateral flank edema, worse on the right.  There is diffuse thoracolumbar spondylosis.  IMPRESSION:  1.  Irregular periurethral enhancement extending asymmetrically into the right prostatic lobe concerning for local extension of transitional cell carcinoma.  No direct invasion of the adjacent pelvic structures is identified.  There is some peripheral enhancement extending along the vas deferens on the left.  2.  Nonspecific bladder wall thickening and perivesical soft tissue stranding.  No pelvic adenopathy or ureteral obstruction identified. 3.  Chronic left renal atrophy with multiple calyceal calculi.  The right kidney demonstrates no significant findings. 4.  No evidence of metastatic disease. 5.  Flank edema with moderate bilateral pleural effusions.   Original Report Authenticated By: Carey Bullocks, M.D.   Dg Chest Port 1 View  12/26/2012   *RADIOLOGY REPORT*  Clinical Data: Fatigue  PORTABLE CHEST - 1 VIEW   Comparison: 12/03/2012  Findings: Cardiomediastinal silhouette is stable.  No pulmonary edema.  Question trace left pleural effusion with left basilar atelectasis.  No segmental infiltrate.  IMPRESSION: Question trace left pleural effusion with left basilar atelectasis. No segmental infiltrate.  No pulmonary edema.   Original Report Authenticated By: Natasha Mead, M.D.    Scheduled Meds: . amLODipine  10 mg Oral Daily  . atorvastatin  10 mg Oral q1800  . ciprofloxacin  500 mg Oral BID  . feeding supplement  30 mL  Oral TID WC  . feeding supplement  1 Container Oral TID BM  . insulin aspart  0-15 Units Subcutaneous TID WC  . insulin aspart  0-5 Units Subcutaneous QHS  . multivitamin with minerals  1 tablet Oral Daily   Continuous Infusions:   Active Problems:   DM (diabetes mellitus), type 2, uncontrolled   Dehydration   Fall at home   Hematuria   Hypokalemia   Urinary retention   Unspecified essential hypertension   Metabolic acidosis, increased anion gap (IAG)   Acute renal failure   Leukocytosis, unspecified   Anemia    Time spent: 40 minutes   [facility]  [facility] Pager [phone]. If 8PM-8AM, please contact night-coverage at [email or web address], password [facility] 01/04/2013, 11:48 AM  LOS: 9 days

## 2013-01-05 LAB — COMPREHENSIVE METABOLIC PANEL
ALT: 6 U/L (ref 0–53)
Alkaline Phosphatase: 42 U/L (ref 39–117)
BUN: 26 mg/dL — ABNORMAL HIGH (ref 6–23)
CO2: 23 mEq/L (ref 19–32)
GFR calc Af Amer: 57 mL/min — ABNORMAL LOW (ref >90)
GFR calc non Af Amer: 49 mL/min — ABNORMAL LOW (ref >90)
Glucose, Bld: 265 mg/dL — ABNORMAL HIGH (ref 70–99)
Potassium: 3.4 mEq/L — ABNORMAL LOW (ref 3.5–5.1)
Sodium: 144 mEq/L (ref 135–145)

## 2013-01-05 LAB — CBC
HCT: 25.8 % — ABNORMAL LOW (ref 39.0–52.0)
Hemoglobin: 8.7 g/dL — ABNORMAL LOW (ref 13.0–17.0)
MCH: 30.5 pg (ref 26.0–34.0)
RBC: 2.85 MIL/uL — ABNORMAL LOW (ref 4.22–5.81)

## 2013-01-05 LAB — GLUCOSE, CAPILLARY: Glucose-Capillary: 189 mg/dL — ABNORMAL HIGH (ref 70–99)

## 2013-01-05 MED ORDER — SODIUM CHLORIDE 0.9 % IJ SOLN
INTRAMUSCULAR | Status: AC
  Administered 2013-01-05: 3 mL via INTRAVENOUS
  Filled 2013-01-05: qty 3

## 2013-01-05 MED ORDER — SODIUM CHLORIDE 0.9 % IJ SOLN
3.0000 mL | Freq: Two times a day (BID) | INTRAMUSCULAR | Status: DC
  Administered 2013-01-06: 3 mL via INTRAVENOUS

## 2013-01-05 MED ORDER — SODIUM CHLORIDE 0.9 % IV SOLN: 250.0000 mL | INTRAVENOUS | Status: DC | PRN

## 2013-01-05 MED ORDER — INSULIN GLARGINE 100 UNIT/ML ~~LOC~~ SOLN
SUBCUTANEOUS | Status: AC
  Filled 2013-01-05: qty 10

## 2013-01-05 MED ORDER — SODIUM CHLORIDE 0.9 % IJ SOLN: 3.0000 mL | INTRAMUSCULAR | Status: DC | PRN

## 2013-01-05 MED ORDER — INSULIN GLARGINE 100 UNIT/ML ~~LOC~~ SOLN
15.0000 [IU] | Freq: Every day | SUBCUTANEOUS | Status: DC
  Administered 2013-01-05 – 2013-01-06 (×2): 15 [IU] via SUBCUTANEOUS
  Filled 2013-01-05 (×3): qty 0.15

## 2013-01-05 NOTE — Progress Notes (Signed)
Inpatient Diabetes Program Recommendations  AACE/ADA: New Consensus Statement on Inpatient Glycemic Control (2013)  Target Ranges:  Prepandial:   less than 140 mg/dL      Peak postprandial:   less than 180 mg/dL (1-2 hours)      Critically ill patients:  140 - 180 mg/dL   Results for Charles Gibbs, Charles Gibbs (MRN [id number]) as of 01/05/2013 14:02  Ref. Range 01/04/2013 07:45 01/04/2013 11:37 01/04/2013 16:49 01/04/2013 21:18 01/05/2013 07:24 01/05/2013 11:44  Glucose-Capillary Latest Range: 70-99 mg/dL 657 (H) 846 (H) 962 (H) 190 (H) 236 (H) 222 (H)    Inpatient Diabetes Program Recommendations Insulin - Basal: Please consider ordering low dose basal insulin; recommend starting with Levemir 5 units QHS and adjust accordingly to improve glycemic control.  Note: Patient has a history of diabetes and takes Metformin 500 mg BID as an outpatient for diabetes management.  Currently, patient is ordered to receive Novolog 0-15 units AC and Novolog 0-5 units HS for inpatient glycemic control.  Blood glucose over the past 24 hours has ranged from 119-242 mg/dl.  Please consider ordering low dose basal insulin; recommend Levemir 5 units QHS.  Will continue to follow as an inpatient.  Thanks, Orlando Penner, RN, MSN, CCRN Diabetes Coordinator Inpatient Diabetes Program [phone]

## 2013-01-05 NOTE — Progress Notes (Signed)
Report [phone]

## 2013-01-05 NOTE — Progress Notes (Signed)
[facility] PROGRESS NOTE  Charles Gibbs WUJ:[id number] DOB: 06/20/1927 DOA: 12/26/2012 PCP: Rudi Heap, MD  Assessment/Plan: Active Problems:   DM (diabetes mellitus), type 2, uncontrolled   Dehydration   Fall at home   Hematuria   Hypokalemia   Urinary retention   Unspecified essential hypertension   Metabolic acidosis, increased anion gap (IAG)   Acute renal failure   Leukocytosis, unspecified   Anemia    *Brief narrative  79 year old male patient with history of DM 2, chronic indwelling Foley catheter for urinary retention, HTN was admitted on 12/26/12 with generalized weakness and a possible fall. He recently underwent litholapaxy for large bladder stone and urinary retention. 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 He is due to have TURP by urology. During recent admission in July, patient declined SNF. He was admitted for dehydration, UTI and fall at home and was treated with IV fluids and Rocephin. On 8/11, patient started having [place] hematuria and urology was consulted. Urology plans surgery on 8/18 if he is still in the hospital, otherwise can return OP for procedure.    Assessment/Plan:  1. Gross Hematuria/high-grade transitional cell carcinoma/urinary retention: Possibly from local extension of transitional cell carcinoma. Patient continues to ooze Carrington red blood around the Foley catheter. Urology is following & discussed with Dr. Jerre Simon on 8/13-plans for surgery on Monday.  he is n.p.o. past midnight.  DCed antiplatelets & heparin. Repeated urine cultures x 3: Negative. Antibiotics were changed from Rocephin to Cipro per urology recommendations to cover for possible prostatitis (had Escherichia coli and Proteus on urine culture in July). Bleeding seems to have stopped today. 2. Anemia: Dropped to 8.7. Follow up CBC in a.m. may need a transfusion before surgery if continues to trend down  3. Hypokalemia: Possibly from bicarbonate drip.  Repleted 4. High anion gap metabolic acidosis: Unclear etiology. ? Secondary to renal insufficiency but creatinine not significantly elevated. Status post bicarbonate infusion-acidosis resolved. DCed bicarbonate drip.  5. Leukocytosis:? Stress margination.? Prostatitis-changed to Cipro.resolved. 6. Acute renal failure: IV fluids and follow BMP. No obstruction on CT. Creatinine continues to gradually rise-? Obstruction-per urology on Monday. DC lisinopril. Patient apparently had no output from catheter today but was irrigated and got 925 ML. So increasing creatinine may be secondary to intermittent urinary retention. 7. Uncontrolled type II DM: SSI. Reasonable inpatient control. 8. HTN: UnControlled. If creatinine continues to rise, may have to DC lisinopril and use alternate agents i.e. Amlodipine. 9.  10.  Code Status: Full  Family Communication: None today.  Disposition Plan: Patient lives alone, does not have 24/7 supervision per case management note, refuses SNF, will be unsafe to return home by himself do to weakness and deconditioning. Prefer that he stays over the weekend to have his urological procedure on Monday.  Consultants:  Urology Procedures:  Indwelling Foley catheter Antibiotics:  IV Rocephin 8/8 > 8/13  Cipro 8/13 > HPI/Subjective:  Denies complaints. Appetite better today.     Objective: Filed Vitals:   01/04/13 0520 01/04/13 1307 01/04/13 2121 01/05/13 0636  BP: 137/71 142/69 143/66 135/70  Pulse: 75 90 85 78  Temp: 98.2 F (36.8 C) 97.8 F (36.6 C) 98.1 F (36.7 C) 98.1 F (36.7 C)  TempSrc:      Resp: 20 18 20 20   Height:      Weight:      SpO2: 98% 98% 98% 96%    Intake/Output Summary (Last 24 hours) at 01/05/13 1104 Last data filed at 01/04/13 1842  Gross per  24 hour  Intake    480 ml  Output    550 ml  Net    -70 ml    Exam:  HENT:  Head: Atraumatic.  Nose: Nose normal.  Mouth/Throat: Oropharynx is clear and moist.  Eyes: Conjunctivae are  normal. Pupils are equal, round, and reactive to light. No scleral icterus.  Neck: Neck supple. No tracheal deviation present.  Cardiovascular: Normal rate, regular rhythm, normal heart sounds and intact distal pulses.  Pulmonary/Chest: Effort normal and breath sounds normal. No respiratory distress.  Abdominal: Soft. Normal appearance and bowel sounds are normal. She exhibits no distension. There is no tenderness.  Musculoskeletal: She exhibits no edema and no tenderness.  Neurological: She is alert. No cranial nerve deficit.    Data Reviewed: Basic Metabolic Panel:  Recent Labs Lab 12/31/12 0526 01/01/13 0456 01/02/13 0618 01/03/13 0558 01/05/13 0537  NA 136 139 139 141 144  K 3.7 3.1* 3.9 3.8 3.4*  CL 103 107 108 112 112  CO2 15* 23 23 22 23   GLUCOSE 240* 129* 120* 137* 265*  BUN 21 18 16 21  26*  CREATININE 1.31 1.41* 1.61* 1.48* 1.30  CALCIUM 8.2* 7.5* 7.6* 7.6* 7.6*    Liver Function Tests:  Recent Labs Lab 12/30/12 0526 01/05/13 0537  AST 12 11  ALT <5 6  ALKPHOS 41 42  BILITOT 0.2* 0.2*  PROT 5.1* 4.7*  ALBUMIN 2.1* 2.0*   No results found for this basename: LIPASE, AMYLASE,  in the last 168 hours No results found for this basename: AMMONIA,  in the last 168 hours  CBC:  Recent Labs Lab 01/01/13 0456 01/01/13 1725 01/02/13 0618 01/03/13 0558 01/04/13 0604 01/05/13 0537  WBC 7.4  --  7.3 6.2 6.2 6.2  HGB 9.0* 9.9* 9.1* 8.5* 8.7* 8.7*  HCT 25.9* 28.7* 26.6* 25.6* 25.9* 25.8*  MCV 87.5  --  88.7 90.8 90.9 90.5  PLT 232  --  209 186 172 166    Cardiac Enzymes: No results found for this basename: CKTOTAL, CKMB, CKMBINDEX, TROPONINI,  in the last 168 hours BNP (last 3 results) No results found for this basename: PROBNP,  in the last 8760 hours   CBG:  Recent Labs Lab 01/04/13 0745 01/04/13 1137 01/04/13 1649 01/04/13 2118 01/05/13 0724  GLUCAP 119* 205* 242* 190* 236*    Recent Results (from the past 240 hour(s))  URINE CULTURE      Status: None   Collection Time    12/26/12  3:12 PM      Result Value Range Status   Specimen Description URINE, CLEAN CATCH   Final   Special Requests NONE   Final   Culture  Setup Time     Final   Value: 12/27/2012 00:18     Performed at [facility] Count     Final   Value: NO GROWTH     Performed at [facility]   Culture     Final   Value: NO GROWTH     Performed at [facility]   Report Status 12/28/2012 FINAL   Final  URINE CULTURE     Status: None   Collection Time    12/29/12 11:45 AM      Result Value Range Status   Specimen Description URINE, CATHETERIZED   Final   Special Requests NONE   Final   Culture  Setup Time     Final   Value: 12/29/2012 17:50  Performed at [facility] Count     Final   Value: NO GROWTH     Performed at [facility]   Culture     Final   Value: NO GROWTH     Performed at [facility]   Report Status 12/30/2012 FINAL   Final     Studies: Ct Abdomen Pelvis W Wo Contrast  12/30/2012   *RADIOLOGY REPORT*  Clinical Data: 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  History of diabetes and hypertension.  CT ABDOMEN AND PELVIS WITHOUT AND WITH CONTRAST  Technique:  Multidetector CT imaging of the abdomen and pelvis was performed without contrast material in one or both body regions, followed by contrast material(s) and further sections in one or both body regions.  Contrast: OMNIPAQUE IOHEXOL 300 MG/ML  SOLN  Comparison: Renal ultrasound 12/03/2012.  Findings: There are moderate sized dependent pleural effusions bilaterally with associated bibasilar atelectasis.  Coronary artery calcifications are noted.  Precontrast images demonstrate marked atrophy of the left kidney with multiple large calyceal calculi in its lower pole and interpolar region.  No right-sided renal or ureteral calculi are demonstrated.  Postcontrast, the right kidney  enhances normally and demonstrates no evidence of mass or urothelial lesion.  There is limited excretion from the left kidney.  No left renal mass is seen. There is no evidence of ureteral obstruction.  A Foley catheter is in place.  There is air within the bladder. There is irregular bladder wall thickening and perivesical soft tissue stranding.  There are asymmetric calcifications within the right lobe of the prostate gland surrounding an irregular low density mass or fluid collection, measuring 2.7 x 1.7 cm on image 81 of series 6.  The postcontrast images also demonstrate heterogeneous enhancement surrounding the prostatic urethra.  The seminal vesicles appear normal. Some asymmetric enhancement extends along the vas deferens on the left.  There is a preserved fat plane between the rectum and the prostate gland.  No enlarged abdominal pelvic lymph nodes are seen.  The liver, gallbladder, pancreas, spleen and adrenal glands appear normal.  There is moderate aorto iliac atherosclerosis without aneurysm or large vessel occlusion.  No bowel abnormalities are identified. There is bilateral flank edema, worse on the right.  There is diffuse thoracolumbar spondylosis.  IMPRESSION:  1.  Irregular periurethral enhancement extending asymmetrically into the right prostatic lobe concerning for local extension of transitional cell carcinoma.  No direct invasion of the adjacent pelvic structures is identified.  There is some peripheral enhancement extending along the vas deferens on the left.  2.  Nonspecific bladder wall thickening and perivesical soft tissue stranding.  No pelvic adenopathy or ureteral obstruction identified. 3.  Chronic left renal atrophy with multiple calyceal calculi.  The right kidney demonstrates no significant findings. 4.  No evidence of metastatic disease. 5.  Flank edema with moderate bilateral pleural effusions.   Original Report Authenticated By: Carey Bullocks, M.D.   Dg Chest Port 1  View  12/26/2012   *RADIOLOGY REPORT*  Clinical Data: Fatigue  PORTABLE CHEST - 1 VIEW  Comparison: 12/03/2012  Findings: Cardiomediastinal silhouette is stable.  No pulmonary edema.  Question trace left pleural effusion with left basilar atelectasis.  No segmental infiltrate.  IMPRESSION: Question trace left pleural effusion with left basilar atelectasis. No segmental infiltrate.  No pulmonary edema.   Original Report Authenticated By: Natasha Mead, M.D.    Scheduled Meds: . amLODipine  10 mg Oral Daily  .  atorvastatin  10 mg Oral q1800  . ciprofloxacin  500 mg Oral BID  . feeding supplement  30 mL Oral TID WC  . feeding supplement  1 Container Oral TID BM  . insulin aspart  0-15 Units Subcutaneous TID WC  . insulin aspart  0-5 Units Subcutaneous QHS  . multivitamin with minerals  1 tablet Oral Daily   Continuous Infusions:   Active Problems:   DM (diabetes mellitus), type 2, uncontrolled   Dehydration   Fall at home   Hematuria   Hypokalemia   Urinary retention   Unspecified essential hypertension   Metabolic acidosis, increased anion gap (IAG)   Acute renal failure   Leukocytosis, unspecified   Anemia    Time spent: 40 minutes   [facility]  [facility] Pager [phone]. If 8PM-8AM, please contact night-coverage at [email or web address], password [facility] 01/05/2013, 11:04 AM  LOS: 10 days

## 2013-01-06 ENCOUNTER — Telehealth: Payer: Self-pay | Admitting: Nurse Practitioner

## 2013-01-06 LAB — CBC
HCT: 25.7 % — ABNORMAL LOW (ref 39.0–52.0)
Hemoglobin: 8.7 g/dL — ABNORMAL LOW (ref 13.0–17.0)
MCH: 30.3 pg (ref 26.0–34.0)
MCHC: 33.9 g/dL (ref 30.0–36.0)
MCV: 89.5 fL (ref 78.0–100.0)

## 2013-01-06 LAB — GLUCOSE, CAPILLARY: Glucose-Capillary: 96 mg/dL (ref 70–99)

## 2013-01-06 MED ORDER — INSULIN GLARGINE 100 UNIT/ML ~~LOC~~ SOLN: 10.0000 [IU] | Freq: Every day | SUBCUTANEOUS | Status: DC

## 2013-01-06 MED ORDER — GLIPIZIDE 2.5 MG HALF TABLET: 2.5000 mg | ORAL_TABLET | Freq: Two times a day (BID) | ORAL | Status: DC

## 2013-01-06 MED ORDER — AMLODIPINE BESYLATE 10 MG PO TABS: 10.0000 mg | ORAL_TABLET | Freq: Every day | ORAL | Status: AC

## 2013-01-06 MED ORDER — CIPROFLOXACIN HCL 500 MG PO TABS: 500.0000 mg | ORAL_TABLET | Freq: Two times a day (BID) | ORAL | Status: DC

## 2013-01-06 MED ORDER — TRAZODONE 25 MG HALF TABLET: 25.0000 mg | ORAL_TABLET | Freq: Every evening | ORAL | Status: AC | PRN

## 2013-01-06 NOTE — Clinical Social Work Note (Signed)
CSW met with pt again as CM reports pt said he would go to SNF. CSW discussed it with pt again with MD present and he states he will go home. Pt is aware he will d/c with foley and have home health. CSW will sign off as pt continues to refuse placement. Aware d/c is today and he plans to call his daughter to see if she can pick him up.  Derenda Fennel, [place] [phone]

## 2013-01-06 NOTE — Telephone Encounter (Signed)
Tried to call patient to schedule appt Number entered in system in a non working number Called emergency contact number and spoke to the pt's grand daughter who informed that the pt's correct number is [phone]. Pt's grand daughter also stated that the pt would probably not answer the phone due to weakness She informed he was d/c from the hospital today and EMS had to assist in getting the pt into his home She also informed that the pt was at home alone Encouraged grand daughter to have some one stay with pt for his safety due to her informing us of  the pt's weakness  Notified manager Ardine Eng of above plan, she is in agreement. Will call pt back tomorrow to schedule appt

## 2013-01-06 NOTE — Discharge Summary (Addendum)
Physician Discharge Summary  Charles Gibbs MRN: [id number] DOB/AGE: 1927/08/14 79 y.o.  PCP: Rudi Heap, MD   Admit date: 12/26/2012 Discharge date: 01/06/2013  Discharge Diagnoses:   Proteus mirabilis/Escherichia coli urinary tract infection Urinary retention Stone in the prostatic urethra    DM (diabetes mellitus), type 2, uncontrolled   Dehydration   Fall at home   Hematuria   Hypokalemia   Urinary retention   Unspecified essential hypertension   Metabolic acidosis, increased anion gap (IAG)   Acute renal failure   Leukocytosis, unspecified   Anemia   Follow up with urology Followup with PCP CBC, BMP in one week    Medication List    STOP taking these medications       lisinopril 10 MG tablet  Commonly known as:  PRINIVIL,ZESTRIL      TAKE these medications       amLODipine 10 MG tablet  Commonly known as:  NORVASC  Take 1 tablet (10 mg total) by mouth daily.     aspirin 81 MG chewable tablet  Chew 81 mg by mouth every morning.     atorvastatin 10 MG tablet  Commonly known as:  LIPITOR  Take 10 mg by mouth every morning.     ciprofloxacin 500 MG tablet  Commonly known as:  CIPRO  Take 1 tablet (500 mg total) by mouth 2 (two) times daily.     glipiZIDE 2.5 mg Tabs tablet  Commonly known as:  GLUCOTROL  Take 0.5 tablets (2.5 mg total) by mouth 2 (two) times daily before a meal.     meclizine 25 MG tablet  Commonly known as:  ANTIVERT  Take 25 mg by mouth 3 (three) times daily as needed.     metFORMIN 500 MG tablet  Commonly known as:  GLUCOPHAGE  Take 1 tablet (500 mg total) by mouth 2 (two) times daily with a meal.     ONE-A-DAY 50 PLUS Tabs  Take 1 tablet by mouth at bedtime.     traZODone 25 mg Tabs tablet  Commonly known as:  DESYREL  Take 0.5 tablets (25 mg total) by mouth at bedtime as needed for sleep.        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  History of diabetes and hypertension.  CT ABDOMEN AND PELVIS WITHOUT AND WITH CONTRAST  Technique:  Multidetector CT imaging of the abdomen and pelvis was performed without contrast material in one or both body regions, followed by contrast material(s) and further sections in one or both body regions.  Contrast: OMNIPAQUE IOHEXOL 300 MG/ML  SOLN  Comparison: Renal ultrasound 12/03/2012.  Findings: There are moderate sized dependent pleural effusions bilaterally with associated bibasilar atelectasis.  Coronary artery calcifications are noted.  Precontrast images demonstrate marked atrophy of the left kidney with multiple large calyceal calculi in its lower pole and interpolar region.  No right-sided renal or ureteral calculi are demonstrated.  Postcontrast, the right kidney enhances normally and demonstrates no evidence of mass or urothelial lesion.  There is limited excretion from the left kidney.  No left renal mass is seen. There is no evidence of ureteral obstruction.  A Foley catheter is in place.  There is air within the bladder. There is irregular bladder wall thickening and  perivesical soft tissue stranding.  There are asymmetric calcifications within the right lobe of the prostate gland surrounding an irregular low density mass or fluid collection, measuring 2.7 x 1.7 cm on image 81 of series 6.  The postcontrast images also demonstrate heterogeneous enhancement surrounding the prostatic urethra.  The seminal vesicles appear normal. Some asymmetric enhancement extends along the vas deferens on the left.  There is a preserved fat plane between the rectum and the prostate gland.  No enlarged abdominal pelvic lymph nodes are seen.  The liver, gallbladder, pancreas, spleen and adrenal glands appear normal.  There is moderate aorto iliac  atherosclerosis without aneurysm or large vessel occlusion.  No bowel abnormalities are identified. There is bilateral flank edema, worse on the right.  There is diffuse thoracolumbar spondylosis.  IMPRESSION:  1.  Irregular periurethral enhancement extending asymmetrically into the right prostatic lobe concerning for local extension of transitional cell carcinoma.  No direct invasion of the adjacent pelvic structures is identified.  There is some peripheral enhancement extending along the vas deferens on the left.  2.  Nonspecific bladder wall thickening and perivesical soft tissue stranding.  No pelvic adenopathy or ureteral obstruction identified. 3.  Chronic left renal atrophy with multiple calyceal calculi.  The right kidney demonstrates no significant findings. 4.  No evidence of metastatic disease. 5.  Flank edema with moderate bilateral pleural effusions.   Original Report Authenticated By: Carey Bullocks, M.D.   Dg Chest Port 1 View  12/26/2012   *RADIOLOGY REPORT*  Clinical Data: Fatigue  PORTABLE CHEST - 1 VIEW  Comparison: 12/03/2012  Findings: Cardiomediastinal silhouette is stable.  No pulmonary edema.  Question trace left pleural effusion with left basilar atelectasis.  No segmental infiltrate.  IMPRESSION: Question trace left pleural effusion with left basilar atelectasis. No segmental infiltrate.  No pulmonary edema.   Original Report Authenticated By: Natasha Mead, M.D.       Microbiology: Recent Results (from the past 240 hour(s))  URINE CULTURE     Status: None   Collection Time    12/29/12 11:45 AM      Result Value Range Status   Specimen Description URINE, CATHETERIZED   Final   Special Requests NONE   Final   Culture  Setup Time     Final   Value: 12/29/2012 17:50     Performed at [facility] Count     Final   Value: NO GROWTH     Performed at [facility]   Culture     Final   Value: NO GROWTH     Performed at [facility]   Report  Status 12/30/2012 FINAL   Final     Labs: Results for orders placed during the hospital encounter of 12/26/12 (from the past 48 hour(s))  GLUCOSE, CAPILLARY     Status: Abnormal   Collection Time    01/04/13 11:37 AM      Result Value Range   Glucose-Capillary 205 (*) 70 - 99 mg/dL   Comment 1 Notify RN     Comment 2 Documented in Chart    GLUCOSE, CAPILLARY     Status: Abnormal   Collection Time    01/04/13  4:49 PM      Result Value Range   Glucose-Capillary 242 (*) 70 - 99 mg/dL   Comment 1 Notify RN     Comment 2 Documented in Chart    GLUCOSE, CAPILLARY     Status: Abnormal  Collection Time    01/04/13  9:18 PM      Result Value Range   Glucose-Capillary 190 (*) 70 - 99 mg/dL  CBC     Status: Abnormal   Collection Time    01/05/13  5:37 AM      Result Value Range   WBC 6.2  4.0 - 10.5 K/uL   RBC 2.85 (*) 4.22 - 5.81 MIL/uL   Hemoglobin 8.7 (*) 13.0 - 17.0 g/dL   HCT 16.1 (*) 09.6 - 04.5 %   MCV 90.5  78.0 - 100.0 fL   MCH 30.5  26.0 - 34.0 pg   MCHC 33.7  30.0 - 36.0 g/dL   RDW 40.9  81.1 - 91.4 %   Platelets 166  150 - 400 K/uL  COMPREHENSIVE METABOLIC PANEL     Status: Abnormal   Collection Time    01/05/13  5:37 AM      Result Value Range   Sodium 144  135 - 145 mEq/L   Potassium 3.4 (*) 3.5 - 5.1 mEq/L   Chloride 112  96 - 112 mEq/L   CO2 23  19 - 32 mEq/L   Glucose, Bld 265 (*) 70 - 99 mg/dL   BUN 26 (*) 6 - 23 mg/dL   Creatinine, Ser 7.82  0.50 - 1.35 mg/dL   Calcium 7.6 (*) 8.4 - 10.5 mg/dL   Total Protein 4.7 (*) 6.0 - 8.3 g/dL   Albumin 2.0 (*) 3.5 - 5.2 g/dL   AST 11  0 - 37 U/L   ALT 6  0 - 53 U/L   Alkaline Phosphatase 42  39 - 117 U/L   Total Bilirubin 0.2 (*) 0.3 - 1.2 mg/dL   GFR calc non Af Amer 49 (*) >90 mL/min   GFR calc Af Amer 57 (*) >90 mL/min   Comment: (NOTE)     The eGFR has been calculated using the CKD EPI equation.     This calculation has not been validated in all clinical situations.     eGFR's persistently <90 mL/min  signify possible Chronic Kidney     Disease.  GLUCOSE, CAPILLARY     Status: Abnormal   Collection Time    01/05/13  7:24 AM      Result Value Range   Glucose-Capillary 236 (*) 70 - 99 mg/dL  GLUCOSE, CAPILLARY     Status: Abnormal   Collection Time    01/05/13 11:44 AM      Result Value Range   Glucose-Capillary 222 (*) 70 - 99 mg/dL   Comment 1 Notify RN    GLUCOSE, CAPILLARY     Status: Abnormal   Collection Time    01/05/13  5:12 PM      Result Value Range   Glucose-Capillary 259 (*) 70 - 99 mg/dL   Comment 1 Notify RN    GLUCOSE, CAPILLARY     Status: Abnormal   Collection Time    01/05/13  9:18 PM      Result Value Range   Glucose-Capillary 189 (*) 70 - 99 mg/dL   Comment 1 Notify RN    CBC     Status: Abnormal   Collection Time    01/06/13  5:19 AM      Result Value Range   WBC 9.2  4.0 - 10.5 K/uL   RBC 2.87 (*) 4.22 - 5.81 MIL/uL   Hemoglobin 8.7 (*) 13.0 - 17.0 g/dL   HCT 95.6 (*) 21.3 - 08.6 %  MCV 89.5  78.0 - 100.0 fL   MCH 30.3  26.0 - 34.0 pg   MCHC 33.9  30.0 - 36.0 g/dL   RDW 16.1  09.6 - 04.5 %   Platelets 156  150 - 400 K/uL  GLUCOSE, CAPILLARY     Status: None   Collection Time    01/06/13  7:33 AM      Result Value Range   Glucose-Capillary 96  70 - 99 mg/dL     HPI : 79 year old male patient with history of DM 2, chronic indwelling Foley catheter for urinary retention, HTN was admitted on 12/26/12 with generalized weakness and a possible fall. He recently underwent litholapaxy for large bladder stone and urinary retention. 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 He is due to have TURP by urology. During recent admission in July, patient declined SNF. He was admitted for dehydration, UTI and fall at home and was treated with IV fluids and Rocephin. On 8/11, patient started having Gail hematuria and urology was consulted. Urology was initially planning for surgery on 8/18 if he is still in the hospital, however Dr. Jerre Simon ,  wants to do this procedure outpatient    HOSPITAL COURSE:  Assessment/Plan:  1. Gross Hematuria/high-grade transitional cell carcinoma/urinary retention: Possibly from local extension of transitional cell carcinoma. Patient was found to ooze Rohil red blood around the Foley catheter now resolved Urology is following & discussed with Dr. Jerre Simon on 8/13-initial plans for surgery on Monday. However he does not want to perform the surgery in house. Patient being discharged with a Foley. Foley catheter needs to be flushed on a daily basis.. Initial urine culture showed Proteus mirabilis and Escherichia coli. Repeated urine cultures x 3: Negative. Antibiotics were changed from Rocephin to Cipro per urology recommendations to cover for possible prostatitis (had Escherichia coli and Proteus on urine culture in July). Bleeding seems to have stopped today. 2. Anemia: Dropped to 8.7. The toes outpatient followup of his CBC  3. Hypokalemia: Possibly from bicarbonate drip. Repleted 4. High anion gap metabolic acidosis: Unclear etiology. ? Secondary to renal insufficiency but creatinine not significantly elevated. Status post bicarbonate infusion-acidosis resolved. DCed bicarbonate drip.  5. Leukocytosis:? Stress margination.? Prostatitis-changed to Cipro.which will be continued for another 10 days, leukocytosis resolved 6. Acute renal failure: IV fluids and follow BMP. No obstruction on CT. Creatinine continues to gradually improve-? Obstruction-per urology on Monday. DC lisinopril permanently. He did develop some urinary retention which resolved after irrigation. Patient will be discharged with a Foley catheter that needs to be irrigated on a daily basis.Marland Kitchen  7. Uncontrolled type II DM: SSI. Because of elevated CBC the patient has been initiated on GLUCOTROL , he will continue with metformin 8. HTN: UnControlled. Lisinopril discontinued because of increasing creatinine and, switched to Norvasc    Discharge Exam:   Blood pressure 138/59, pulse 83, temperature 98.9 F (37.2 C), temperature source Oral, resp. rate 19, height 5\' 6"  (1.676 m), weight 59.376 kg (130 lb 14.4 oz), SpO2 96.00%.   Cardiovascular: Normal rate, regular rhythm, normal heart sounds and intact distal pulses.  Pulmonary/Chest: Effort normal and breath sounds normal. No respiratory distress.  Abdominal: Soft. Normal appearance and bowel sounds are normal. She exhibits no distension. There is no tenderness.  Musculoskeletal: She exhibits no edema and no tenderness.  Neurological: She is alert. No cranial nerve deficit.           Follow-up Information   Follow up with [facility].   Contact  information:   [address] [place] [address] [phone]      Signed: Richarda Overlie 01/06/2013, 8:29 AM

## 2013-01-06 NOTE — Progress Notes (Signed)
Nutrition Follow-up  INTERVENTION:  Encouraged consistent po intake food and fluids (8 cups) daily  Recommend continue oral supplements and MVI with discharge medications   NUTRITION DIAGNOSIS: Inadequate oral intake related to decreased appetite; ongoing   Goal: Pt to meet >/= 90% of their estimated nutrition needs; not met    Monitor:  Po intake, labs and wt trends  Reason for Assessment: Assess nutrition status  79 y.o. male  ASSESSMENT: Patient Active Problem List   Diagnosis Date Noted  . Anemia 01/01/2013  . Metabolic acidosis, increased anion gap (IAG) 12/31/2012  . Acute renal failure 12/31/2012  . Leukocytosis, unspecified 12/31/2012  . Bladder stone 12/09/2012  . Unspecified essential hypertension 12/06/2012  . Thrombocytopenia, unspecified 12/05/2012  . Hypoglycemia associated with diabetes 12/04/2012  . Hypokalemia 12/04/2012  . Urinary retention 12/04/2012  . Rhabdomyolysis 12/04/2012  . Hematuria 12/03/2012  . Constipation 03/22/2012  . Fall at home 03/22/2012  . DM (diabetes mellitus), type 2, uncontrolled 06/15/2011  . Dysarthria 06/15/2011  . Acute sinusitis 06/15/2011  . Dehydration 06/15/2011   Pt being d/c home today. He is refusing SNF placement. Encouraged him to consume regular meals and/or nutrition supplements daily. Provided coupons for him to help with cost of purchasing oral supplements.  Nutrition hx 814/15 Pt lives alone. He reports his appetite has been poor for some time.He says he receives frozen meals every other week (meals on wheels) and uses those as his primary source of nutrition. He doesn't use supplements due to the cost.  Increased weakness, and dehydrated on admission and has transitional cell carcinoma. TURP is pending.  Pt po intake is very poor currently (food and beverages). Observed meal intake <25%. Suspect malnutrition unable to confirm. Will obtain re-wt and assess significant change. Pt is agreeable to receive a "juice  type supplement". Consider trial of appetite stimulant.  Height: Ht Readings from Last 1 Encounters:  12/26/12 5\' 6"  (1.676 m)    Weight: Wt Readings from Last 1 Encounters:  12/26/12 130 lb 14.4 oz (59.376 kg)  Pt weighed on admission only.  Ideal Body Weight: 136# (61.8kg)  % Ideal Body Weight: 96%  Wt Readings from Last 10 Encounters:  12/26/12 130 lb 14.4 oz (59.376 kg)  12/16/12 153 lb (69.4 kg)  12/12/12 153 lb 14.1 oz (69.8 kg)  12/12/12 153 lb 14.1 oz (69.8 kg)  12/12/12 153 lb 14.1 oz (69.8 kg)  03/21/12 130 lb 1.1 oz (59 kg)  12/26/11 129 lb 3 oz (58.6 kg)  06/27/11 131 lb (59.421 kg)  06/15/11 123 lb 10.9 oz (56.1 kg)   Usual Body Weight:  130#   % Usual Body Weight: 100%  BMI:  Body mass index is 21.14 kg/(m^2).normal range  Estimated Nutritional Needs: Kcal: 1700-2065 Protein: 70-80 gr Fluid: 1800 ml/day  Skin: No issues noted  Diet Order: Carb Control  EDUCATION NEEDS: -No education needs identified at this time   Intake/Output Summary (Last 24 hours) at 01/06/13 1143 Last data filed at 01/06/13 0522  Gross per 24 hour  Intake      0 ml  Output    250 ml  Net   -250 ml    Last BM: 01/03/13 loose stool  Labs:   Recent Labs Lab 01/02/13 0618 01/03/13 0558 01/05/13 0537  NA 139 141 144  K 3.9 3.8 3.4*  CL 108 112 112  CO2 23 22 23   BUN 16 21 26*  CREATININE 1.61* 1.48* 1.30  CALCIUM 7.6* 7.6* 7.6*  GLUCOSE 120*  137* 265*    CBG (last 3)   Recent Labs  01/05/13 1712 01/05/13 2118 01/06/13 0733  GLUCAP 259* 189* 96    Scheduled Meds: . amLODipine  10 mg Oral Daily  . atorvastatin  10 mg Oral q1800  . ciprofloxacin  500 mg Oral BID  . feeding supplement  30 mL Oral TID WC  . feeding supplement  1 Container Oral TID BM  . insulin aspart  0-15 Units Subcutaneous TID WC  . insulin aspart  0-5 Units Subcutaneous QHS  . insulin glargine  15 Units Subcutaneous Daily  . multivitamin with minerals  1 tablet Oral Daily  .  sodium chloride  3 mL Intravenous Q12H    Continuous Infusions:    Past Medical History  Diagnosis Date  . Diabetes mellitus   . Inner ear inflammation   . Vertigo   . Urinary retention 12/04/2012  . Bladder stone 12/09/2012    S/p CYSTOSCOPY WITH LITHOLAPAXY (N/A)  . Hypertension     Past Surgical History  Procedure Laterality Date  . Cystoscopy N/A 12/08/2012    Procedure: CYSTOSCOPY FLEXIBLE;  Surgeon: Ky Barban, MD;  Location: [facility];  Service: Urology;  Laterality: N/A;  . Cystoscopy with litholapaxy N/A 12/09/2012    Procedure: CYSTOSCOPY WITH LITHOLAPAXY;  Surgeon: Ky Barban, MD;  Location: [facility];  Service: Urology;  Laterality: N/A;  . Holmium laser application N/A 12/09/2012    Procedure: HOLMIUM LASER APPLICATION;  Surgeon: Ky Barban, MD;  Location: [facility];  Service: Urology;  Laterality: N/A;    Royann Shivers MS,RD,LDN,CSG Office: [phone] Pager: [phone]

## 2013-01-06 NOTE — Plan of Care (Signed)
Problem: Phase I Progression Outcomes Goal: Voiding-avoid urinary catheter unless indicated Outcome: Not Applicable Date Met:  01/06/13 Pt has chronic foley, to leave in at DC

## 2013-01-06 NOTE — Progress Notes (Addendum)
Patient discharged home.  HH in place.  Follow up in place with Dr. Jerre Simon and [facility]. Fam Med to call patient at home to schedule f/u appt.  Foley left in place and irrigated before leaving.  Leg bag not used per family request. Instructed on catheter care and hygiene - verbalizes understanding. TURP to be done OP after seeing Dr. Jerre Simon.  IV removed - WNL.  Instructed on new medications and changes.  Family verbalizes understanding.  Questions about going back on insulin, instructed to consult with PCP.  Pt has no complaints and this time and left the floor via WC with help of NT in stable condition.

## 2013-01-07 ENCOUNTER — Telehealth: Payer: Self-pay | Admitting: *Deleted

## 2013-01-07 NOTE — Progress Notes (Signed)
NAME:  ROALD, LUKACS NO.:  [id number]  MEDICAL RECORD NO.:  [id number]  LOCATION:                        FACILITY:  [facility]  PHYSICIAN:  Ky Barban, M.D.DATE OF BIRTH:  08-11-1927  DATE OF PROCEDURE: DATE OF DISCHARGE:  01/06/2013                                PROGRESS NOTE   Mr. Jost is waiting to have a TUR prostate, and his blood glucose is still not well controlled.  His Accu-Chek is running around 222 to 242, and there is a new recommendation that he needs to be followed up to control his diabetes, so I cannot do his surgery tomorrow.  I have told the nurse to irrigate with a couple of 100 mL of saline his bladder.  I suspect that he has some old blood clots in his bladder.  Once he does that, I will decide tomorrow if I want to go and do a TUR of prostate. I have discussed with his hospitalist doctor and the nurse in detail.     Ky Barban, M.D.     MIJ/MEDQ  D:  01/05/2013  T:  01/06/2013  Job:  [id number]

## 2013-01-07 NOTE — Telephone Encounter (Signed)
Attempted to call emergency contact to schedule appt several times Unable to reach

## 2013-01-08 ENCOUNTER — Telehealth: Payer: Self-pay | Admitting: *Deleted

## 2013-01-08 NOTE — Telephone Encounter (Signed)
TELEPHONE CALL [place] PHYSICAL THERAPIST WITH [facility]. SAID MR. Newson CAME HOME FROM HOSPITAL AND THEY WERE RESUMING HIS CARE. IF WE DO NOT APPROVE OF RESUMING HIS CARE CALL THEM BACK AND LET THEM KNOW. [phone]. THANKS.

## 2013-01-08 NOTE — Telephone Encounter (Signed)
That he may resume his care

## 2013-01-08 NOTE — Telephone Encounter (Signed)
Lesley notifed per dr. Christell Constant okay to resume care

## 2013-01-09 ENCOUNTER — Telehealth: Payer: Self-pay | Admitting: Family Medicine

## 2013-01-09 NOTE — Telephone Encounter (Signed)
The # [phone] is a fast busy # (may be invalid as well)

## 2013-01-09 NOTE — Telephone Encounter (Signed)
Call back line and said DDS is involved , but it may be sometimes next week to place him.

## 2013-01-10 ENCOUNTER — Emergency Department (HOSPITAL_COMMUNITY)
Admission: EM | Admit: 2013-01-10 | Discharge: 2013-01-10 | Disposition: A | Discharge status: Home / Self Care | Payer: PRIVATE HEALTH INSURANCE | Attending: Emergency Medicine | Admitting: Emergency Medicine

## 2013-01-10 ENCOUNTER — Encounter (HOSPITAL_COMMUNITY): Payer: Self-pay

## 2013-01-10 DIAGNOSIS — Z85528 Personal history of other malignant neoplasm of kidney: Secondary | ICD-10-CM | POA: Insufficient documentation

## 2013-01-10 DIAGNOSIS — R259 Unspecified abnormal involuntary movements: Secondary | ICD-10-CM | POA: Insufficient documentation

## 2013-01-10 DIAGNOSIS — Z79899 Other long term (current) drug therapy: Secondary | ICD-10-CM | POA: Insufficient documentation

## 2013-01-10 DIAGNOSIS — N390 Urinary tract infection, site not specified: Secondary | ICD-10-CM | POA: Insufficient documentation

## 2013-01-10 DIAGNOSIS — R5381 Other malaise: Secondary | ICD-10-CM | POA: Insufficient documentation

## 2013-01-10 DIAGNOSIS — R231 Pallor: Secondary | ICD-10-CM | POA: Insufficient documentation

## 2013-01-10 DIAGNOSIS — Z8669 Personal history of other diseases of the nervous system and sense organs: Secondary | ICD-10-CM | POA: Insufficient documentation

## 2013-01-10 DIAGNOSIS — Z87442 Personal history of urinary calculi: Secondary | ICD-10-CM | POA: Insufficient documentation

## 2013-01-10 DIAGNOSIS — Z7982 Long term (current) use of aspirin: Secondary | ICD-10-CM | POA: Insufficient documentation

## 2013-01-10 DIAGNOSIS — Z87448 Personal history of other diseases of urinary system: Secondary | ICD-10-CM | POA: Insufficient documentation

## 2013-01-10 DIAGNOSIS — E162 Hypoglycemia, unspecified: Secondary | ICD-10-CM

## 2013-01-10 DIAGNOSIS — E1169 Type 2 diabetes mellitus with other specified complication: Secondary | ICD-10-CM | POA: Insufficient documentation

## 2013-01-10 DIAGNOSIS — R34 Anuria and oliguria: Secondary | ICD-10-CM | POA: Insufficient documentation

## 2013-01-10 DIAGNOSIS — I10 Essential (primary) hypertension: Secondary | ICD-10-CM | POA: Insufficient documentation

## 2013-01-10 HISTORY — DX: Malignant (primary) neoplasm, unspecified: C80.1

## 2013-01-10 LAB — URINALYSIS, ROUTINE W REFLEX MICROSCOPIC
Glucose, UA: NEGATIVE mg/dL
Nitrite: POSITIVE — AB
Protein, ur: 100 mg/dL — AB
Specific Gravity, Urine: 1.025 (ref 1.005–1.030)
Urobilinogen, UA: 0.2 mg/dL (ref 0.0–1.0)
pH: 5.5 (ref 5.0–8.0)

## 2013-01-10 LAB — CBC WITH DIFFERENTIAL/PLATELET
Basophils Absolute: 0 10*3/uL (ref 0.0–0.1)
Basophils Relative: 0 % (ref 0–1)
Eosinophils Absolute: 0.1 10*3/uL (ref 0.0–0.7)
Eosinophils Relative: 1 % (ref 0–5)
HCT: 30.6 % — ABNORMAL LOW (ref 39.0–52.0)
Hemoglobin: 10.6 g/dL — ABNORMAL LOW (ref 13.0–17.0)
Lymphocytes Relative: 16 % (ref 12–46)
Lymphs Abs: 1.1 10*3/uL (ref 0.7–4.0)
MCH: 30.4 pg (ref 26.0–34.0)
MCHC: 34.6 g/dL (ref 30.0–36.0)
MCV: 87.7 fL (ref 78.0–100.0)
Monocytes Absolute: 0.7 10*3/uL (ref 0.1–1.0)
Monocytes Relative: 9 % (ref 3–12)
Neutro Abs: 5.2 10*3/uL (ref 1.7–7.7)
Neutrophils Relative %: 74 % (ref 43–77)
Platelets: 174 10*3/uL (ref 150–400)
RBC: 3.49 MIL/uL — ABNORMAL LOW (ref 4.22–5.81)
RDW: 14.8 % (ref 11.5–15.5)
WBC: 7 10*3/uL (ref 4.0–10.5)

## 2013-01-10 LAB — BASIC METABOLIC PANEL
BUN: 23 mg/dL (ref 6–23)
Calcium: 7.9 mg/dL — ABNORMAL LOW (ref 8.4–10.5)
Creatinine, Ser: 1.15 mg/dL (ref 0.50–1.35)
GFR calc non Af Amer: 57 mL/min — ABNORMAL LOW (ref >90)
Glucose, Bld: 126 mg/dL — ABNORMAL HIGH (ref 70–99)

## 2013-01-10 LAB — GLUCOSE, CAPILLARY
Glucose-Capillary: 113 mg/dL — ABNORMAL HIGH (ref 70–99)
Glucose-Capillary: 92 mg/dL (ref 70–99)
Glucose-Capillary: 89 mg/dL (ref 70–99)

## 2013-01-10 LAB — URINE MICROSCOPIC-ADD ON

## 2013-01-10 MED ORDER — DEXTROSE 5 % IV SOLN
1.0000 g | Freq: Once | INTRAVENOUS | Status: AC
  Administered 2013-01-10: 1 g via INTRAVENOUS
  Filled 2013-01-10: qty 10

## 2013-01-10 MED ORDER — POTASSIUM CHLORIDE CRYS ER 20 MEQ PO TBCR
40.0000 meq | EXTENDED_RELEASE_TABLET | Freq: Once | ORAL | Status: AC
  Administered 2013-01-10: 40 meq via ORAL
  Filled 2013-01-10: qty 2

## 2013-01-10 MED ORDER — SODIUM CHLORIDE 0.9 % IV SOLN
INTRAVENOUS | Status: DC
  Administered 2013-01-10: 15:00:00 via INTRAVENOUS

## 2013-01-10 MED ORDER — SODIUM CHLORIDE 0.9 % IV BOLUS (SEPSIS)
500.0000 mL | Freq: Once | INTRAVENOUS | Status: AC
  Administered 2013-01-10: 500 mL via INTRAVENOUS

## 2013-01-10 NOTE — ED Notes (Signed)
Foley catheter noted with no urine drainage, foley irrigated with return of 800 ml foul smelling cloudy urine with significant sediment.

## 2013-01-10 NOTE — ED Notes (Signed)
Attempted to call family again, still no answer. Pt states he does not know of any other numbers

## 2013-01-10 NOTE — ED Notes (Signed)
Attempted to call pt daughter to pick him up for a ride. No answer. Pt trying to find someone to pick him up.

## 2013-01-10 NOTE — ED Provider Notes (Signed)
CSN: [id number]     Arrival date & time 01/10/13  1345 History   First MD Initiated Contact with Patient 01/10/13 1349     Chief Complaint  Patient presents with  . Hypoglycemia  . decreased urinary output     HPI Patient presents to emergency room with complaints of hypoglycemia. The patient has history of diabetes and takes metformin as well as glipizide. 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 The patient and his blood sugar this morning in the 40s. 911 was called. He was given an amp of D50 with increase in his CBG to 165. The patient's mental status increased following the D50. Patient does have a history of renal cell carcinoma. He was recently in the hospital and discharged on August 18. His home health nurse also reported decreased urine output.  Patient denies any specific complaints right now. He does have general weakness and admits that he did not eat this morning. He states his appetite has not been that good. he did the dinner last night. He is not sure When he last took his medications. Past Medical History  Diagnosis Date  . Diabetes mellitus   . Inner ear inflammation   . Vertigo   . Urinary retention 12/04/2012  . Bladder stone 12/09/2012    S/p CYSTOSCOPY WITH LITHOLAPAXY (N/A)  . Hypertension   . Cancer     kidney cancer   Past Surgical History  Procedure Laterality Date  . Cystoscopy N/A 12/08/2012    Procedure: CYSTOSCOPY FLEXIBLE;  Surgeon: Ky Barban, MD;  Location: [facility];  Service: Urology;  Laterality: N/A;  . Cystoscopy with litholapaxy N/A 12/09/2012    Procedure: CYSTOSCOPY WITH LITHOLAPAXY;  Surgeon: Ky Barban, MD;  Location: [facility];  Service: Urology;  Laterality: N/A;  . Holmium laser application N/A 12/09/2012    Procedure: HOLMIUM LASER APPLICATION;  Surgeon: Ky Barban, MD;  Location: [facility];  Service: Urology;  Laterality: N/A;   Family History  Problem Relation Age of Onset  . Stroke  Mother   . Cancer Mother   . Heart failure Mother   . Heart failure Father    History  Substance Use Topics  . Smoking status: Never Smoker   . Smokeless tobacco: Never Used  . Alcohol Use: No    Review of Systems  Constitutional: Positive for fatigue. Negative for fever.  Respiratory: Negative for choking and chest tightness.   Cardiovascular: Negative for chest pain.  Gastrointestinal: Negative for abdominal pain.  Neurological: Positive for weakness.    Allergies  Review of patient's allergies indicates no known allergies.  Home Medications   Current Outpatient Rx  Name  Route  Sig  Dispense  Refill  . amLODipine (NORVASC) 10 MG tablet   Oral   Take 1 tablet (10 mg total) by mouth daily.   30 tablet   2   . aspirin 81 MG chewable tablet   Oral   Chew 81 mg by mouth every morning.          Marland Kitchen atorvastatin (LIPITOR) 10 MG tablet   Oral   Take 10 mg by mouth every morning.         . ciprofloxacin (CIPRO) 500 MG tablet   Oral   Take 1 tablet (500 mg total) by mouth 2 (two) times daily.   30 tablet   2   . glipiZIDE (GLUCOTROL) 2.5 mg TABS tablet   Oral  Take 0.5 tablets (2.5 mg total) by mouth 2 (two) times daily before a meal.   30 tablet   2   . meclizine (ANTIVERT) 25 MG tablet   Oral   Take 25 mg by mouth 3 (three) times daily as needed.         . metFORMIN (GLUCOPHAGE) 500 MG tablet   Oral   Take 1 tablet (500 mg total) by mouth 2 (two) times daily with a meal.   60 tablet   0   . Multiple Vitamins-Minerals (ONE-A-DAY 50 PLUS) TABS   Oral   Take 1 tablet by mouth at bedtime.         . traZODone (DESYREL) 25 mg TABS tablet   Oral   Take 0.5 tablets (25 mg total) by mouth at bedtime as needed for sleep.   30 tablet   2    BP 162/80  Pulse 75  Temp(Src) 97.5 F (36.4 C) (Oral)  Resp 18  SpO2 97% Physical Exam  Nursing note and vitals reviewed. Constitutional: No distress.  Frail, elderly  HENT:  Head: Normocephalic and  atraumatic.  Right Ear: External ear normal.  Left Ear: External ear normal.  Mouth/Throat: No oropharyngeal exudate.  Eyes: Conjunctivae are normal. Right eye exhibits no discharge. Left eye exhibits no discharge. No scleral icterus.  Neck: Neck supple. No tracheal deviation present.  Cardiovascular: Normal rate, regular rhythm and intact distal pulses.   Pulmonary/Chest: Effort normal and breath sounds normal. No stridor. No respiratory distress. He has no wheezes. He has no rales.  Abdominal: Soft. Bowel sounds are normal. He exhibits no distension. There is no tenderness. There is no rebound and no guarding.  Genitourinary:  Indwelling Foley catheter, dark urine in the bag  Musculoskeletal: He exhibits no edema and no tenderness.  Neurological: He is alert. He displays tremor. No sensory deficit. Cranial nerve deficit:  no gross defecits noted. He exhibits normal muscle tone. He displays no seizure activity. Coordination normal.  There is weakness of the patient can lift both arms and legs off the bed, his lower chin is weaker than his upper extremities, no facial asymmetry noted  Skin: Skin is warm and dry. No rash noted. There is pallor.  Psychiatric: He has a normal mood and affect.    ED Course   Procedures (including critical care time)  Labs Reviewed  URINALYSIS, ROUTINE W REFLEX MICROSCOPIC - Abnormal; Notable for the following:    Color, Urine AMBER (*)    APPearance CLOUDY (*)    Hgb urine dipstick LARGE (*)    Bilirubin Urine SMALL (*)    Ketones, ur TRACE (*)    Protein, ur 100 (*)    Nitrite POSITIVE (*)    Leukocytes, UA LARGE (*)    All other components within normal limits  GLUCOSE, CAPILLARY - Abnormal; Notable for the following:    Glucose-Capillary 110 (*)    All other components within normal limits  CBC WITH DIFFERENTIAL - Abnormal; Notable for the following:    RBC 3.49 (*)    Hemoglobin 10.6 (*)    HCT 30.6 (*)    All other components within normal  limits  BASIC METABOLIC PANEL - Abnormal; Notable for the following:    Potassium 3.2 (*)    Glucose, Bld 126 (*)    Calcium 7.9 (*)    GFR calc non Af Amer 57 (*)    GFR calc Af Amer 66 (*)    All other components within normal  limits  GLUCOSE, CAPILLARY - Abnormal; Notable for the following:    Glucose-Capillary 113 (*)    All other components within normal limits  URINE MICROSCOPIC-ADD ON - Abnormal; Notable for the following:    Bacteria, UA MANY (*)    All other components within normal limits  URINE CULTURE  GLUCOSE, CAPILLARY  GLUCOSE, CAPILLARY   No results found. 1. Hypoglycemia   2. UTI (urinary tract infection)     MDM  Pt presented with hypoglycemia.  He denies any extra doses of medications.  i suspect his symptoms may be related to his debilitated state and his poor po intake.  Pt was fed here in the ED.  He was monitored for several hours.   Pt does have a persistent uti for which he is taking cipro.  No fevers or vomiting.  Vital signs stable.   Discussed with hospitalist Dr. Sherrie Mustache regarding possible admission for observation.  Pt has been stable in the ED for several hours.  He does have home health assisting him at the house. The patient's blood sugar remained stable. He ate in the emergency room without difficulty     Celene Kras, MD 01/14/13 [phone]

## 2013-01-10 NOTE — ED Notes (Signed)
Pt had soiled himself. Pt cleaned & changed before sending home w/ family

## 2013-01-10 NOTE — ED Notes (Signed)
Pt eating at this time. States he may be feeling a little better.

## 2013-01-10 NOTE — ED Notes (Addendum)
Carb coverage provided with lunch tray.  The patient states that he has not eaten since yesterday.  Pt arrives with foley in place.  Drainage bag changed.

## 2013-01-10 NOTE — ED Notes (Signed)
Attempted to call family again to pick pt up, still no answer.

## 2013-01-10 NOTE — ED Notes (Signed)
Family member reached & advised she would come to get him.

## 2013-01-10 NOTE — ED Notes (Signed)
Pt oriented x3, is repetitious at times.

## 2013-01-10 NOTE — ED Notes (Signed)
EMS reports pt was diagnosed recently with kidney cancer and was discharged on Aug 18th.  Home health nurse reports today cbg was 41 and pt has had decreased urinary output.  EMS administered 1amp d50 and cbg increased 165.  Pt presently alert and oriented.  Pt came with indwelling foley.

## 2013-01-12 NOTE — Telephone Encounter (Signed)
Patient has been back to the ED since this original message was taken. He has home health services and DSS is trying to place him.

## 2013-01-13 ENCOUNTER — Observation Stay (HOSPITAL_COMMUNITY)
Admission: EM | Admit: 2013-01-13 | Discharge: 2013-01-15 | Disposition: A | Discharge status: Home / Self Care | Payer: PRIVATE HEALTH INSURANCE | Attending: Internal Medicine | Admitting: Internal Medicine

## 2013-01-13 ENCOUNTER — Telehealth (HOSPITAL_COMMUNITY): Payer: Self-pay | Admitting: Emergency Medicine

## 2013-01-13 ENCOUNTER — Encounter (HOSPITAL_COMMUNITY): Payer: Self-pay | Admitting: *Deleted

## 2013-01-13 DIAGNOSIS — E1165 Type 2 diabetes mellitus with hyperglycemia: Secondary | ICD-10-CM

## 2013-01-13 DIAGNOSIS — N390 Urinary tract infection, site not specified: Secondary | ICD-10-CM

## 2013-01-13 DIAGNOSIS — IMO0002 Reserved for concepts with insufficient information to code with codable children: Secondary | ICD-10-CM

## 2013-01-13 DIAGNOSIS — E11649 Type 2 diabetes mellitus with hypoglycemia without coma: Secondary | ICD-10-CM

## 2013-01-13 DIAGNOSIS — D649 Anemia, unspecified: Secondary | ICD-10-CM

## 2013-01-13 DIAGNOSIS — N210 Calculus in bladder: Secondary | ICD-10-CM | POA: Insufficient documentation

## 2013-01-13 DIAGNOSIS — I10 Essential (primary) hypertension: Secondary | ICD-10-CM | POA: Insufficient documentation

## 2013-01-13 DIAGNOSIS — Z1639 Resistance to other specified antimicrobial drug: Secondary | ICD-10-CM | POA: Insufficient documentation

## 2013-01-13 DIAGNOSIS — D638 Anemia in other chronic diseases classified elsewhere: Secondary | ICD-10-CM | POA: Insufficient documentation

## 2013-01-13 DIAGNOSIS — E162 Hypoglycemia, unspecified: Secondary | ICD-10-CM

## 2013-01-13 DIAGNOSIS — Z2239 Carrier of other specified bacterial diseases: Secondary | ICD-10-CM | POA: Insufficient documentation

## 2013-01-13 DIAGNOSIS — R339 Retention of urine, unspecified: Secondary | ICD-10-CM | POA: Diagnosis present

## 2013-01-13 DIAGNOSIS — C679 Malignant neoplasm of bladder, unspecified: Secondary | ICD-10-CM

## 2013-01-13 DIAGNOSIS — R627 Adult failure to thrive: Secondary | ICD-10-CM | POA: Insufficient documentation

## 2013-01-13 DIAGNOSIS — E1169 Type 2 diabetes mellitus with other specified complication: Principal | ICD-10-CM | POA: Insufficient documentation

## 2013-01-13 LAB — GLUCOSE, CAPILLARY
Glucose-Capillary: 140 mg/dL — ABNORMAL HIGH (ref 70–99)
Glucose-Capillary: 97 mg/dL (ref 70–99)
Glucose-Capillary: 81 mg/dL (ref 70–99)
Glucose-Capillary: 78 mg/dL (ref 70–99)

## 2013-01-13 LAB — BASIC METABOLIC PANEL
CO2: 24 mEq/L (ref 19–32)
Chloride: 105 mEq/L (ref 96–112)
GFR calc Af Amer: 64 mL/min — ABNORMAL LOW (ref >90)
Potassium: 3.7 mEq/L (ref 3.5–5.1)
Sodium: 140 mEq/L (ref 135–145)

## 2013-01-13 LAB — URINE CULTURE: Colony Count: >=100000

## 2013-01-13 LAB — CBC WITH DIFFERENTIAL/PLATELET
Basophils Absolute: 0 10*3/uL (ref 0.0–0.1)
Basophils Relative: 0 % (ref 0–1)
HCT: 31 % — ABNORMAL LOW (ref 39.0–52.0)
Lymphocytes Relative: 16 % (ref 12–46)
Monocytes Absolute: 0.6 10*3/uL (ref 0.1–1.0)
Neutro Abs: 7.6 10*3/uL (ref 1.7–7.7)
Neutrophils Relative %: 78 % — ABNORMAL HIGH (ref 43–77)
Platelets: 192 10*3/uL (ref 150–400)
RDW: 15 % (ref 11.5–15.5)
WBC: 9.7 10*3/uL (ref 4.0–10.5)

## 2013-01-13 MED ORDER — HEPARIN SODIUM (PORCINE) 5000 UNIT/ML IJ SOLN
5000.0000 [IU] | Freq: Three times a day (TID) | INTRAMUSCULAR | Status: DC
  Administered 2013-01-13 – 2013-01-15 (×6): 5000 [IU] via SUBCUTANEOUS
  Filled 2013-01-13 (×5): qty 1

## 2013-01-13 MED ORDER — ACETAMINOPHEN 650 MG RE SUPP: 650.0000 mg | Freq: Four times a day (QID) | RECTAL | Status: DC | PRN

## 2013-01-13 MED ORDER — ACETAMINOPHEN 325 MG PO TABS: 650.0000 mg | ORAL_TABLET | Freq: Four times a day (QID) | ORAL | Status: DC | PRN

## 2013-01-13 MED ORDER — SODIUM CHLORIDE 0.9 % IV SOLN
INTRAVENOUS | Status: AC
  Administered 2013-01-13: 22:00:00 via INTRAVENOUS

## 2013-01-13 MED ORDER — ASPIRIN 81 MG PO CHEW
81.0000 mg | CHEWABLE_TABLET | Freq: Every morning | ORAL | Status: DC
  Administered 2013-01-14 – 2013-01-15 (×2): 81 mg via ORAL
  Filled 2013-01-13 (×2): qty 1

## 2013-01-13 MED ORDER — TRAZODONE HCL 50 MG PO TABS: 25.0000 mg | ORAL_TABLET | Freq: Every evening | ORAL | Status: DC | PRN

## 2013-01-13 MED ORDER — SODIUM CHLORIDE 0.9 % IV BOLUS (SEPSIS)
1000.0000 mL | Freq: Once | INTRAVENOUS | Status: AC
  Administered 2013-01-13: 1000 mL via INTRAVENOUS

## 2013-01-13 MED ORDER — LISINOPRIL 10 MG PO TABS
10.0000 mg | ORAL_TABLET | Freq: Every day | ORAL | Status: DC
  Administered 2013-01-13 – 2013-01-15 (×3): 10 mg via ORAL
  Filled 2013-01-13 (×3): qty 1

## 2013-01-13 MED ORDER — AMLODIPINE BESYLATE 5 MG PO TABS
10.0000 mg | ORAL_TABLET | Freq: Every day | ORAL | Status: DC
  Administered 2013-01-13 – 2013-01-15 (×3): 10 mg via ORAL
  Filled 2013-01-13 (×3): qty 2

## 2013-01-13 MED ORDER — DEXTROSE 50 % IV SOLN
INTRAVENOUS | Status: AC
  Administered 2013-01-13: 50 mL via INTRAVENOUS
  Filled 2013-01-13: qty 50

## 2013-01-13 MED ORDER — DEXTROSE 50 % IV SOLN: 1.0000 | Freq: Once | INTRAVENOUS | Status: AC

## 2013-01-13 NOTE — H&P (Addendum)
History and Physical  Charles Gibbs:[id number] DOB: 20-Sep-1927 DOA: 01/13/2013  Referring physician: Dr. Bernette Mayers PCP: Rudi Heap, MD   Chief Complaint: Low blood sugar  HPI:  79 year old man presented to the emergency department for hypoglycemia, presumably secondary from continued taking of oral anti-hyperglycemics. Because of recurrent visits to the ER and hospitalizations observation was recommended to follow blood sugars and coordinate outpatient care.  History obtained from chart and patient. See summary of history below. Patient very vague historian. He says he feels well now and has absolutely no complaints. He has not checked his blood sugars very often and does not eat very much. By chart review he has still been taking metformin and glipizide, he reports his daughter administers his medications and he cannot provide details. No family is available to supplement the history.  In the emergency department he was noted be afebrile stable vital signs. Screening laboratory studies were unremarkable except for hypoglycemia. This improved with D50.  Admitted 11/2012. At that time had had a fall at home, mild rhabdomyolysis, dehydration. He was noted to have urinary retention and hematuria secondary to bladder stone and underwent urology evaluation. Foley catheter was placed. Tip was planned as an outpatient at that time within the next week. Hospitalization was complicated by profound symptomatic hypoglycemia. At discharge all his anti-hyperglycemics were discontinued his use of hypoglycemia less risky than hyperglycemia and his blood sugars have been under 250 without treatment. He was subsequently admitted 8/8- 8/19 for generalized weakness and a fall. He was admitted for UTI. During that hospitalization he was found to have gross hematuria and bladder cancer. TURP was deferred apparently for mild hyperglycemia.  Review of Systems:  Negative for fever, visual changes, sore throat, rash,  new muscle aches, chest pain, SOB, dysuria, bleeding, n/v/abdominal pain.  Past Medical History  Diagnosis Date  . Diabetes mellitus   . Inner ear inflammation   . Vertigo   . Urinary retention 12/04/2012  . Bladder stone 12/09/2012    S/p CYSTOSCOPY WITH LITHOLAPAXY (N/A)  . Hypertension   . Cancer     kidney cancer    Past Surgical History  Procedure Laterality Date  . Cystoscopy N/A 12/08/2012    Procedure: CYSTOSCOPY FLEXIBLE;  Surgeon: Ky Barban, MD;  Location: [facility];  Service: Urology;  Laterality: N/A;  . Cystoscopy with litholapaxy N/A 12/09/2012    Procedure: CYSTOSCOPY WITH LITHOLAPAXY;  Surgeon: Ky Barban, MD;  Location: [facility];  Service: Urology;  Laterality: N/A;  . Holmium laser application N/A 12/09/2012    Procedure: HOLMIUM LASER APPLICATION;  Surgeon: Ky Barban, MD;  Location: [facility];  Service: Urology;  Laterality: N/A;    Social History:  reports that he has never smoked. He has never used smokeless tobacco. He reports that he does not drink alcohol or use illicit drugs.  No Known Allergies  Family History  Problem Relation Age of Onset  . Stroke Mother   . Cancer Mother   . Heart failure Mother   . Heart failure Father      Prior to Admission medications   Medication Sig Start Date End Date Taking? Authorizing Provider  glipiZIDE (GLUCOTROL) 5 MG tablet Take 2.5 mg by mouth 2 (two) times daily before a meal.   Yes Historical Provider, MD  lisinopril (PRINIVIL,ZESTRIL) 10 MG tablet Take 10 mg by mouth daily.   Yes Historical Provider, MD  metFORMIN (GLUCOPHAGE) 500 MG tablet Take 500 mg by mouth 2 (two) times daily with  a meal.   Yes Historical Provider, MD  traZODone (DESYREL) 25 mg TABS tablet Take 0.5 tablets (25 mg total) by mouth at bedtime as needed for sleep. 01/06/13  Yes Richarda Overlie, MD  amLODipine (NORVASC) 10 MG tablet Take 1 tablet (10 mg total) by mouth daily. 01/06/13   Richarda Overlie, MD  aspirin 81 MG chewable tablet  Chew 81 mg by mouth every morning.     Historical Provider, MD  atorvastatin (LIPITOR) 10 MG tablet Take 10 mg by mouth every morning.    Historical Provider, MD  ciprofloxacin (CIPRO) 500 MG tablet Take 1 tablet (500 mg total) by mouth 2 (two) times daily. 01/06/13 01/16/13  Richarda Overlie, MD  meclizine (ANTIVERT) 25 MG tablet Take 25 mg by mouth 3 (three) times daily as needed.    Historical Provider, MD  Multiple Vitamins-Minerals (ONE-A-DAY 50 PLUS) TABS Take 1 tablet by mouth at bedtime.    Historical Provider, MD   Physical Exam: Filed Vitals:   01/13/13 1422 01/13/13 1600 01/13/13 1700 01/13/13 1813  BP: 169/71 159/87 168/82 167/68  Pulse: 83 80 82 81  Temp: 98.5 F (36.9 C)     TempSrc: Oral     Resp: 18     Height: 5\' 6"  (1.676 m)     Weight: 59.421 kg (131 lb)     SpO2: 98% 100% 99% 100%   General: Examined in the emergency department. Appears calm and comfortable Eyes: PERRL, normal lids, irises  ENT: grossly normal hearing, lips & tongue Neck: no LAD, masses or thyromegaly Cardiovascular: RRR, no m/r/g. No LE edema. Respiratory: CTA bilaterally, no w/r/r. Normal respiratory effort. Abdomen: soft, ntnd Skin: no rash or induration seen  Musculoskeletal: grossly normal tone BUE/BLE Psychiatric: grossly normal mood and affect, speech fluent and appropriate Neurologic: grossly non-focal.  Wt Readings from Last 3 Encounters:  01/13/13 59.421 kg (131 lb)  12/26/12 59.376 kg (130 lb 14.4 oz)  12/16/12 69.4 kg (153 lb)    Labs on Admission:  Basic Metabolic Panel:  Recent Labs Lab 01/10/13 1417 01/13/13 1505  NA 138 140  K 3.2* 3.7  CL 105 105  CO2 23 24  GLUCOSE 126* 163*  BUN 23 17  CREATININE 1.15 1.19  CALCIUM 7.9* 7.7*   CBC:  Recent Labs Lab 01/10/13 1417 01/13/13 1505  WBC 7.0 9.7  NEUTROABS 5.2 7.6  HGB 10.6* 10.6*  HCT 30.6* 31.0*  MCV 87.7 89.3  PLT 174 192   CBG:  Recent Labs Lab 01/10/13 1535 01/10/13 1704 01/13/13 1423  01/13/13 1443 01/13/13 1816  GLUCAP 92 89 34* 140* 97   Principal Problem:   Hypoglycemia associated with diabetes Active Problems:   Urinary retention   Anemia   Failure to thrive   Bladder cancer   Assessment/Plan 1. Recurrent hypoglycemia: Presumably secondary to glipizide complicated by poor diet. Improved thus far with glucose. Monitor closely with CBG. Hold all anti-hyperglycemics.  2. Diabetes mellitus type 2: Monitor CBGs. No sliding scale insulin. Hypoglycemia has been a recurrent issue. Permissive. hyperglycemia should be preferred, within reason. Tight glycemic control not indicated. No oral anti-hyperglycemics with the exception possibly of metformin.  3. Failure to thrive, Suspect mild dehydration:  IV fluids. Patient has been offered skilled nursing facility placement in the past and has refused this. He has presented several times now with hypoglycemia, compliance is questioned in the ability to care for himself as questioned. Apparently [facility] was called but made no report to ED. Consult CSW. Reconsider skilled  nursing facility or assisted living facility.  4. Recent diagnosis of bladder cancer, urinary retention, bladder stone: Further management per urology. Needs followup with cancer Center.  5. Anemia of chronic disease, presumed: Appears stable. 6. VRE in urine: Afebrile, normal white blood cell count, normal mentation, no suprapubic pain. No evidence to suggest active infection. Given his chronic indwelling Foley suspect colonization and would not advise treatment at this point.  Code Status: Full code  DVT prophylaxis:Heparin  Family Communication: None present  Disposition Plan/Anticipated LOS: Observation  Time spent: 50  minutes  Brendia Sacks, MD  [facility] Pager [phone] 01/13/2013, 6:19 PM

## 2013-01-13 NOTE — ED Notes (Signed)
Results called from [facility].  (+) URNC -> >/= 100,000 colonies VRE.  Rocephin 1 gm given IV in ED. Called Dr Kathi Der office to see if pt had followed up as ordered and was informed by office they have been unable to contact.  Chart to MD for review.

## 2013-01-13 NOTE — Progress Notes (Signed)
Patient being admitted to room 314,report given by Arther Abbott RN. Awaiting for patient to arrive to floor.

## 2013-01-13 NOTE — ED Notes (Signed)
Pt arrived with foley catheter in place, reported by another staff member that foley had been changed on 12/26/12

## 2013-01-13 NOTE — ED Notes (Signed)
Pt with hypoglycemia, EMS CBG 47-pt had juice and soda and last CBG 57, no food given

## 2013-01-13 NOTE — ED Provider Notes (Signed)
CSN: [id number]     Arrival date & time 01/13/13  1419 History   First MD Initiated Contact with Patient 01/13/13 1428     Chief Complaint  Patient presents with  . Hypoglycemia   (Consider location/radiation/quality/duration/timing/severity/associated sxs/prior Treatment) Patient is a 79 y.o. male presenting with hypoglycemia.  Hypoglycemia  Pt with history of chronic indwelling foley, frequent UTI and hypoglycemia admitted for same before and seen in the ED 3 days ago. Has been told not to take any oral diabetes medications but is still taking Metformin and Glipizide. Found by EMS to be low blood sugar today and brought to the ED. States he has not been eating well, bed bound at home. General weakness but no fever, vomiting or diarrhea.  Past Medical History  Diagnosis Date  . Diabetes mellitus   . Inner ear inflammation   . Vertigo   . Urinary retention 12/04/2012  . Bladder stone 12/09/2012    S/p CYSTOSCOPY WITH LITHOLAPAXY (N/A)  . Hypertension   . Cancer     kidney cancer   Past Surgical History  Procedure Laterality Date  . Cystoscopy N/A 12/08/2012    Procedure: CYSTOSCOPY FLEXIBLE;  Surgeon: Ky Barban, MD;  Location: [facility];  Service: Urology;  Laterality: N/A;  . Cystoscopy with litholapaxy N/A 12/09/2012    Procedure: CYSTOSCOPY WITH LITHOLAPAXY;  Surgeon: Ky Barban, MD;  Location: [facility];  Service: Urology;  Laterality: N/A;  . Holmium laser application N/A 12/09/2012    Procedure: HOLMIUM LASER APPLICATION;  Surgeon: Ky Barban, MD;  Location: [facility];  Service: Urology;  Laterality: N/A;   Family History  Problem Relation Age of Onset  . Stroke Mother   . Cancer Mother   . Heart failure Mother   . Heart failure Father    History  Substance Use Topics  . Smoking status: Never Smoker   . Smokeless tobacco: Never Used  . Alcohol Use: No    Review of Systems All other systems reviewed and are negative except as noted in HPI.   Allergies   Review of patient's allergies indicates no known allergies.  Home Medications   Current Outpatient Rx  Name  Route  Sig  Dispense  Refill  . glipiZIDE (GLUCOTROL) 5 MG tablet   Oral   Take 2.5 mg by mouth 2 (two) times daily before a meal.         . lisinopril (PRINIVIL,ZESTRIL) 10 MG tablet   Oral   Take 10 mg by mouth daily.         . metFORMIN (GLUCOPHAGE) 500 MG tablet   Oral   Take 500 mg by mouth 2 (two) times daily with a meal.         . traZODone (DESYREL) 25 mg TABS tablet   Oral   Take 0.5 tablets (25 mg total) by mouth at bedtime as needed for sleep.   30 tablet   2   . amLODipine (NORVASC) 10 MG tablet   Oral   Take 1 tablet (10 mg total) by mouth daily.   30 tablet   2   . aspirin 81 MG chewable tablet   Oral   Chew 81 mg by mouth every morning.          Marland Kitchen atorvastatin (LIPITOR) 10 MG tablet   Oral   Take 10 mg by mouth every morning.         . ciprofloxacin (CIPRO) 500 MG tablet   Oral   Take  1 tablet (500 mg total) by mouth 2 (two) times daily.   30 tablet   2   . meclizine (ANTIVERT) 25 MG tablet   Oral   Take 25 mg by mouth 3 (three) times daily as needed.         . Multiple Vitamins-Minerals (ONE-A-DAY 50 PLUS) TABS   Oral   Take 1 tablet by mouth at bedtime.          BP 169/71  Pulse 83  Temp(Src) 98.5 F (36.9 C) (Oral)  Resp 18  Ht 5\' 6"  (1.676 m)  Wt 131 lb (59.421 kg)  BMI 21.15 kg/m2  SpO2 98% Physical Exam  Nursing note and vitals reviewed. Constitutional: He is oriented to person, place, and time. He appears well-developed and well-nourished.  HENT:  Head: Normocephalic and atraumatic.  Dry mouth  Eyes: EOM are normal. Pupils are equal, round, and reactive to light.  Neck: Normal range of motion. Neck supple.  Cardiovascular: Normal rate, normal heart sounds and intact distal pulses.   Pulmonary/Chest: Effort normal and breath sounds normal.  Abdominal: Bowel sounds are normal. He exhibits no distension.  There is no tenderness.  Musculoskeletal: Normal range of motion. He exhibits no edema and no tenderness.  Neurological: He is alert and oriented to person, place, and time. He has normal strength. No cranial nerve deficit or sensory deficit.  Skin: Skin is warm and dry. No rash noted.  Psychiatric: He has a normal mood and affect.    ED Course  Procedures (including critical care time) Labs Review Labs Reviewed  GLUCOSE, CAPILLARY - Abnormal; Notable for the following:    Glucose-Capillary 34 (*)    All other components within normal limits  CBC WITH DIFFERENTIAL - Abnormal; Notable for the following:    RBC 3.47 (*)    Hemoglobin 10.6 (*)    HCT 31.0 (*)    Neutrophils Relative % 78 (*)    All other components within normal limits  BASIC METABOLIC PANEL - Abnormal; Notable for the following:    Glucose, Bld 163 (*)    Calcium 7.7 (*)    GFR calc non Af Amer 55 (*)    GFR calc Af Amer 64 (*)    All other components within normal limits  GLUCOSE, CAPILLARY - Abnormal; Notable for the following:    Glucose-Capillary 140 (*)    All other components within normal limits  URINALYSIS, ROUTINE W REFLEX MICROSCOPIC   Imaging Review No results found.  MDM   1. Hypoglycemia   2. Chronic UTI    Pt given D50 on arrival, with improvement in CBG but no change in baseline mental status which is normal. He was found to have VRE on recent ED visit. Labs otherwise unremarkable but appears dehydrated clinically. Discussed with Dr. Irene Limbo on call for Hospitalist who knows him well. He does not recommend any treatment for the UTI given chronicity and otherwise normal labs and vitals. Will admit for observation of hypoglycemia. Apparently Adult Protective Services was called but unknown who called them or why. They have been to see the patient but left without discussing their findings with me or the nurse.     Omarii Scalzo B. Bernette Mayers, MD 01/13/13 [phone]

## 2013-01-14 ENCOUNTER — Telehealth: Payer: Self-pay | Admitting: Family Medicine

## 2013-01-14 DIAGNOSIS — D649 Anemia, unspecified: Secondary | ICD-10-CM

## 2013-01-14 DIAGNOSIS — E162 Hypoglycemia, unspecified: Secondary | ICD-10-CM

## 2013-01-14 DIAGNOSIS — C679 Malignant neoplasm of bladder, unspecified: Secondary | ICD-10-CM

## 2013-01-14 LAB — GLUCOSE, CAPILLARY
Glucose-Capillary: 138 mg/dL — ABNORMAL HIGH (ref 70–99)
Glucose-Capillary: 83 mg/dL (ref 70–99)
Glucose-Capillary: 83 mg/dL (ref 70–99)
Glucose-Capillary: 94 mg/dL (ref 70–99)

## 2013-01-14 LAB — BASIC METABOLIC PANEL
Chloride: 110 mEq/L (ref 96–112)
Creatinine, Ser: 1.27 mg/dL (ref 0.50–1.35)
GFR calc Af Amer: 59 mL/min — ABNORMAL LOW (ref >90)
GFR calc non Af Amer: 51 mL/min — ABNORMAL LOW (ref >90)

## 2013-01-14 MED ORDER — POLYETHYLENE GLYCOL 3350 17 G PO PACK
17.0000 g | PACK | Freq: Every day | ORAL | Status: DC
  Administered 2013-01-14 – 2013-01-15 (×2): 17 g via ORAL
  Filled 2013-01-14 (×2): qty 1

## 2013-01-14 MED ORDER — POTASSIUM CHLORIDE CRYS ER 20 MEQ PO TBCR
40.0000 meq | EXTENDED_RELEASE_TABLET | Freq: Once | ORAL | Status: AC
  Administered 2013-01-14: 40 meq via ORAL

## 2013-01-14 NOTE — Care Management Note (Signed)
    Page 1 of 1   01/15/2013     1:39:28 PM   CARE MANAGEMENT NOTE 01/15/2013  Patient:  ALEZANDER, DIMAANO   Account Number:  [id number]  Date Initiated:  01/14/2013  Documentation initiated by:  Sharrie Rothman  Subjective/Objective Assessment:   Pt admitted from home with hypoglycemia. Pt lives alone and has a daughter and granddaughter that checks on pt frequently. Pt is active with [facility] RN and PT. Pt has walker, BSC. Pt is active with [facility].     Action/Plan:   CM will arrange resumption of HH at discharge. Pt has refused SNF at this time but family is insistent that pt go somewhere. CSW to follow up with pt.   Anticipated DC Date:  01/15/2013   Anticipated DC Plan:  HOME W HOME HEALTH SERVICES  In-house referral  Clinical Social Worker      DC Planning Services  CM consult      Choice offered to / List presented to:             Status of service:  Completed, signed off Medicare Important Message given?  NA - LOS <3 / Initial given by admissions (If response is "NO", the following Medicare IM given date fields will be blank) Date Medicare IM given:   Date Additional Medicare IM given:    Discharge Disposition:  SKILLED NURSING FACILITY  Per UR Regulation:    If discussed at Long Length of Stay Meetings, dates discussed:    Comments:  01/15/13 1340 Arlyss Queen, RN BSN CM Pt discharged to [facility] today. CSW to arrange discharge to facility.  01/14/13 1145 Arlyss Queen, RN BSN CM

## 2013-01-14 NOTE — Progress Notes (Signed)
Inpatient Diabetes Program Recommendations  AACE/ADA: New Consensus Statement on Inpatient Glycemic Control (2013)  Target Ranges:  Prepandial:   less than 140 mg/dL      Peak postprandial:   less than 180 mg/dL (1-2 hours)      Critically ill patients:  140 - 180 mg/dL   Results for JAIZON, DEROOS (MRN [id number]) as of 01/14/2013 10:19  Ref. Range 01/13/2013 14:23 01/13/2013 14:43 01/13/2013 18:16 01/13/2013 19:53 01/13/2013 21:33 01/14/2013 00:01 01/14/2013 02:10 01/14/2013 04:00 01/14/2013 05:59 01/14/2013 07:22  Glucose-Capillary Latest Range: 70-99 mg/dL 34 (LL) 409 (H) 97 81 78 83 83 94 93 97    Inpatient Diabetes Program Recommendations Correction (SSI): Once blood glucose is stabilized, please consider ordering CBGs ACHS with Novolog moderate correction scale.  Note: Patient has a history of diabetes and takes Glipizide 2.5 mg BID and Metformin 500 mg BID at home for diabetes management.  Currently, patient is not ordered to receive any medications for inpatient glycemic control but blood glucose is being monitored every 2 hours.  Patient was hospitalized from 12/26/2012 to 01/06/2013 and during that time he was ordered CBGs ACHS with Novolog moderate correction scale.  At time of discharge, his discharge instructions indicated for him to continue Glipizide and Metformin.  Patient presented to the ED on 8/23 with hypoglycemia and was treated and discharged home.  Patient was brought to the ED via EMS on 8/26 for hypoglycemia.  Hypoglycemia likely related to oral diabetic medications taken as an outpatient.  Once blood glucose is stabilized, please consider ordering CBGs ACHS with Novolog moderate correction scale while inpatient.  At time of discharge, his oral diabetic medications need to be re-evaluated to prevent future episodes of hypoglycemia.  Will continue to follow as an inpatient.  Thanks, Orlando Penner, RN, MSN, CCRN Diabetes Coordinator Inpatient Diabetes Program [phone]

## 2013-01-14 NOTE — Progress Notes (Signed)
Clinical Social Work Department CLINICAL SOCIAL WORK PLACEMENT NOTE 01/14/2013  Patient:  Charles Gibbs, Charles Gibbs  Account Number:  [id number] Admit date:  01/13/2013  Clinical Social Worker:  Robin Searing  Date/time:  01/14/2013 12:31 PM  Clinical Social Work is seeking post-discharge placement for this patient at the following level of care:   SKILLED NURSING   (*CSW will update this form in Epic as items are completed)   01/14/2013  Patient/family provided with [facility] Department of Clinical Social Work's list of facilities offering this level of care within the geographic area requested by the patient (or if unable, by the patient's family).  01/14/2013  Patient/family informed of their freedom to choose among providers that offer the needed level of care, that participate in Medicare, Medicaid or managed care program needed by the patient, have an available bed and are willing to accept the patient.  01/14/2013  Patient/family informed of [facility]' ownership interest in [facility], as well as of the fact that they are under no obligation to receive care at this facility.  PASARR submitted to EDS on 01/14/2013 PASARR number received from EDS on   FL2 transmitted to all facilities in geographic area requested by pt/family on  01/14/2013 FL2 transmitted to all facilities within larger geographic area on   Patient informed that his/her managed care company has contracts with or will negotiate with  certain facilities, including the following:     Patient/family informed of bed offers received:   Patient chooses bed at  Physician recommends and patient chooses bed at    Patient to be transferred to  on   Patient to be transferred to facility by   The following physician request were entered in Epic:   Additional Comments: Reece Levy, MSW [phone]

## 2013-01-14 NOTE — Progress Notes (Signed)
Pt arrived to the floor via ED tech c/o no pain. Pt had fecal matter in urinary catheter. In process of getting c-diff evaluation.

## 2013-01-14 NOTE — Progress Notes (Signed)
UR chart review completed.  

## 2013-01-14 NOTE — Progress Notes (Signed)
[facility] PROGRESS NOTE  ADEMOLA VERT ZHY:[id number] DOB: 1928-03-13 DOA: 01/13/2013 PCP: Rudi Heap, MD  Assessment/Plan: 1. Hypoglycemia with type 2 diabetes. Likely related to medications. We'll continue to follow blood sugars today. They appear to be running stable. We'll discontinue any oral hypoglycemics at this time and continue to monitor blood sugars. Agree that permissive hyperglycemia would be acceptable within reason. 2. Generalized weakness and failure to thrive. We'll get physical therapy to assess. May need placement 3. 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 Normal antibiotics. Next anemia of chronic disease. Stable 4. Recent diagnosis of bladder cancer, urinary retention, bladder stone. Continue Foley catheter. Followup with urology and cancer Center. 5. Hypertension. Stable  Code Status: full code Family Communication: discussed with patient and family at bedside Disposition Plan: will likely be ready for discharge tomorrow   Consultants:  none  Procedures:  none  Antibiotics: None  HPI/Subjective: No new complaints  Objective: Filed Vitals:   01/14/13 1422  BP: 159/72  Pulse: 84  Temp: 97.9 F (36.6 C)  Resp: 20    Intake/Output Summary (Last 24 hours) at 01/14/13 1438 Last data filed at 01/14/13 0601  Gross per 24 hour  Intake 1746.25 ml  Output   2600 ml  Net -853.75 ml   Filed Weights   01/13/13 1422 01/13/13 2038  Weight: 59.421 kg (131 lb) 66.8 kg (147 lb 4.3 oz)    Exam:   General:  NAD  Cardiovascular: S1, S2 RRR  Respiratory: CTA B  Abdomen: Soft, nt, nd, bs+  Musculoskeletal: no pedal edema b/l   Data Reviewed: Basic Metabolic Panel:  Recent Labs Lab 01/10/13 1417 01/13/13 1505 01/14/13 0448  NA 138 140 144  K 3.2* 3.7 3.2*  CL 105 105 110  CO2 23 24 26   GLUCOSE 126* 163* 89  BUN 23 17 17   CREATININE 1.15 1.19 1.27  CALCIUM 7.9* 7.7* 7.7*   Liver Function Tests: No results found for this basename:  AST, ALT, ALKPHOS, BILITOT, PROT, ALBUMIN,  in the last 168 hours No results found for this basename: LIPASE, AMYLASE,  in the last 168 hours No results found for this basename: AMMONIA,  in the last 168 hours CBC:  Recent Labs Lab 01/10/13 1417 01/13/13 1505  WBC 7.0 9.7  NEUTROABS 5.2 7.6  HGB 10.6* 10.6*  HCT 30.6* 31.0*  MCV 87.7 89.3  PLT 174 192   Cardiac Enzymes: No results found for this basename: CKTOTAL, CKMB, CKMBINDEX, TROPONINI,  in the last 168 hours BNP (last 3 results) No results found for this basename: PROBNP,  in the last 8760 hours CBG:  Recent Labs Lab 01/14/13 0210 01/14/13 0400 01/14/13 0559 01/14/13 0722 01/14/13 1048  GLUCAP 83 94 93 97 87    Recent Results (from the past 240 hour(s))  URINE CULTURE     Status: None   Collection Time    01/10/13  2:26 PM      Result Value Range Status   Specimen Description URINE, CLEAN CATCH   Final   Special Requests NONE   Final   Culture  Setup Time     Final   Value: 01/10/2013 21:07     Performed at [facility] Count     Final   Value: >=100,000 COLONIES/ML     Performed at [facility]   Culture     Final   Value: VANCOMYCIN RESISTANT ENTEROCOCCUS ISOLATED     Note: CRITICAL RESULT CALLED TO, READ BACK BY  AND VERIFIED WITH: PATTY CLARK @11AM  ON 8.26 BY BUONO     Performed at [facility]   Report Status 01/13/2013 FINAL   Final   Organism ID, Bacteria VANCOMYCIN RESISTANT ENTEROCOCCUS ISOLATED   Final     Studies: No results found.  Scheduled Meds: . amLODipine  10 mg Oral Daily  . aspirin  81 mg Oral q morning - 10a  . heparin  5,000 Units Subcutaneous Q8H  . lisinopril  10 mg Oral Daily   Continuous Infusions:   Principal Problem:   Hypoglycemia associated with diabetes Active Problems:   Urinary retention   Anemia   Failure to thrive   Bladder cancer    Time spent:    Delania Ferg  [facility] Pager [phone]. If 7PM-7AM,  please contact night-coverage at [email or web address], password [facility] 01/14/2013, 2:38 PM  LOS: 1 day

## 2013-01-14 NOTE — Progress Notes (Signed)
Post ED Visit - Positive Culture Follow-up  Culture report reviewed by antimicrobial stewardship pharmacist: []  Wes Dulaney, Pharm.D., BCPS []  Celedonio Miyamoto, [address].D., BCPS []  Georgina Pillion, Pharm.D., BCPS []  [place], [place].D., BCPS, AAHIVP [x]  Estella Husk, Pharm.D., BCPS, AAHIVP  Positive Urine culture Patient has a chronic indwelling Foley and is likely colonized with this organism.  Agree with the hospitalist decision not to treat in this asymptomatic patient (he is currently admitted at [facility]).  Sallee Provencal 01/14/2013, 9:48 AM

## 2013-01-14 NOTE — Progress Notes (Signed)
Clinical Social Work Department BRIEF PSYCHOSOCIAL ASSESSMENT 01/14/2013  Patient:  Charles Gibbs, Charles Gibbs     Account Number:  [id number]     Admit date:  01/13/2013  Clinical Social Worker:  Robin Searing  Date/Time:  01/14/2013 12:25 PM  Referred by:  Physician  Date Referred:  01/14/2013 Referred for  SNF Placement   Other Referral:   Interview type:  Other - See comment Other interview type:   Patient's daughter and granddaughter at bedside as well    PSYCHOSOCIAL DATA Living Status:  ALONE Admitted from facility:   Level of care:   Primary support name:  daughter Primary support relationship to patient:  FAMILY Degree of support available:   good    CURRENT CONCERNS Current Concerns  Post-Acute Placement   Other Concerns:    SOCIAL WORK ASSESSMENT / PLAN Patient admitted from home- discussed possible SNF for rehab with him and his family at bedside. Patient expresses preference in returning home at d/c but is open to considering SNF for a short rehab stay-   Assessment/plan status:  Other - See comment Other assessment/ plan:   Will complete FL2 and PASARR for SNF search   Information/referral to community resources:   SNF  EMS  [facility]    PATIENT'S/FAMILY'S RESPONSE TO PLAN OF CARE: Patient and family are agreeable to SNF search-  Preference is [facility] as the daughter works there. will proceed with SNF search and advise as offers are rec'd.       Reece Levy, MSW [phone]

## 2013-01-15 ENCOUNTER — Other Ambulatory Visit: Payer: Self-pay | Admitting: Family Medicine

## 2013-01-15 LAB — GLUCOSE, CAPILLARY
Glucose-Capillary: 112 mg/dL — ABNORMAL HIGH (ref 70–99)
Glucose-Capillary: 89 mg/dL (ref 70–99)
Glucose-Capillary: 104 mg/dL — ABNORMAL HIGH (ref 70–99)

## 2013-01-15 LAB — BASIC METABOLIC PANEL
CO2: 23 mEq/L (ref 19–32)
Glucose, Bld: 104 mg/dL — ABNORMAL HIGH (ref 70–99)
Potassium: 3.5 mEq/L (ref 3.5–5.1)
Sodium: 143 mEq/L (ref 135–145)

## 2013-01-15 NOTE — Progress Notes (Signed)
Patient for d/c today to SNF bed at [facility]. Family and patient agreeable to this plan- plan transfer via EMS. Reece Levy, MSW [phone]

## 2013-01-15 NOTE — Telephone Encounter (Signed)
Not taking

## 2013-01-15 NOTE — Progress Notes (Signed)
01/15/13 1619 Patient discharged to [facility], left floor in stable condition via EMS transport. Earnstine Regal, RN

## 2013-01-15 NOTE — Progress Notes (Signed)
Clinical Social Work Department CLINICAL SOCIAL WORK PLACEMENT NOTE 01/15/2013  Patient:  Charles Gibbs, Charles Gibbs  Account Number:  [id number] Admit date:  01/13/2013  Clinical Social Worker:  Robin Searing  Date/time:  01/14/2013 12:31 PM  Clinical Social Work is seeking post-discharge placement for this patient at the following level of care:   SKILLED NURSING   (*CSW will update this form in Epic as items are completed)   01/14/2013  Patient/family provided with [facility] Department of Clinical Social Work's list of facilities offering this level of care within the geographic area requested by the patient (or if unable, by the patient's family).  01/14/2013  Patient/family informed of their freedom to choose among providers that offer the needed level of care, that participate in Medicare, Medicaid or managed care program needed by the patient, have an available bed and are willing to accept the patient.  01/14/2013  Patient/family informed of [facility]' ownership interest in [facility], as well as of the fact that they are under no obligation to receive care at this facility.  PASARR submitted to EDS on 01/14/2013 PASARR number received from EDS on   FL2 transmitted to all facilities in geographic area requested by pt/family on  01/14/2013 FL2 transmitted to all facilities within larger geographic area on   Patient informed that his/her managed care company has contracts with or will negotiate with  certain facilities, including the following:     Patient/family informed of bed offers received:  01/15/2013 Patient chooses bed at [facility] [place] Physician recommends and patient chooses bed at    Patient to be transferred to [facility]  on  01/15/2013 Patient to be transferred to facility by EMS  The following physician request were entered in Epic:   Additional Comments: Reece Levy, MSW [phone]

## 2013-01-15 NOTE — Progress Notes (Signed)
UR chart review completed.  

## 2013-01-15 NOTE — Progress Notes (Signed)
01/15/13 1603 patient being discharged to SNF this afternoon. Discharge packet prepared by Child psychotherapist. Called report to [facility], LPN receiving nurse at [facility].  IV site d/c'd per T. Cobb, NT, site within normal limits. Patient to keep foley catheter in place for discharge per MD. Pt in stable condition, awaiting EMS transport for discharge. Earnstine Regal, RN

## 2013-01-15 NOTE — Plan of Care (Signed)
Problem: Phase I Progression Outcomes Goal: OOB as tolerated unless otherwise ordered Outcome: Completed/Met Date Met:  01/15/13 01/15/13 1222 Patient up to chair with assist of PT this morning. States comfortable, call light within reach. Chair alarm on for safety. Earnstine Regal, RN Goal: Voiding-avoid urinary catheter unless indicated Outcome: Not Met (add Reason) 01/15/13 1223 Patient has chronic foley catheter from home in place. Earnstine Regal, RN

## 2013-01-15 NOTE — Discharge Summary (Signed)
Physician Discharge Summary  Charles Gibbs [id number]:[id number] DOB: [birth year] DOA: 01/13/2013  PCP: Rudi Heap, MD  Admit date: 01/13/2013 Discharge date: 01/15/2013  Time spent: 35 minutes  Recommendations for Outpatient Follow-up:  1. Patient to discharge to skilled nursing facility for physical therapy. 2. Glipizide and metformin have been discontinued due to hypoglycemia.  Discharge Diagnoses:  Principal Problem:   Hypoglycemia associated with diabetes Active Problems:   Urinary retention   Anemia   Failure to thrive   Bladder cancer   Discharge Condition: Improved  Diet recommendation: Low salt, low carb  Filed Weights   01/13/13 1422 01/13/13 2038 01/15/13 0354  Weight: 59.421 kg (131 lb) 66.8 kg (147 lb 4.3 oz) 72.5 kg (159 lb 13.3 oz)    History of present illness:  79 year old man presented to the emergency department for hypoglycemia, presumably secondary from continued taking of oral anti-hyperglycemics. Because of recurrent visits to the ER and hospitalizations observation was recommended to follow blood sugars and coordinate outpatient care.  History obtained from chart and patient. See summary of history below. Patient very vague historian. He says he feels well now and has absolutely no complaints. He has not checked his blood sugars very often and does not eat very much. By chart review he has still been taking metformin and glipizide, he reports his daughter administers his medications and he cannot provide details. No family is available to supplement the history.  In the emergency department he was noted be afebrile stable vital signs. Screening laboratory studies were unremarkable except for hypoglycemia. This improved with D50.  Admitted 11/2012. At that time had had a fall at home, mild rhabdomyolysis, dehydration. He was noted to have urinary retention and hematuria secondary to bladder stone and underwent urology evaluation. Foley catheter was placed. Tip was  planned as an outpatient at that time within the next week. Hospitalization was complicated by profound symptomatic hypoglycemia. At discharge all his anti-hyperglycemics were discontinued his use of hypoglycemia less risky than hyperglycemia and his blood sugars have been under 250 without treatment. He was subsequently admitted 8/8- 8/19 for generalized weakness and a fall. He was admitted for UTI. During that hospitalization he was found to have gross hematuria and bladder cancer. TURP was deferred apparently for mild hyperglycemia.   Hospital Course:  This she will to the hospital for hypoglycemia. He was on glipizide and metformin prior to admission. These were held. His blood sugars since that time had normalized. He's not had any recurrent episodes of hypoglycemia. Would recommend he not stay on any oral hypoglycemics and blood sugar should be monitored. He was also noted to be encephalopathic from hypoglycemia. This is also since resolved. He has VRE in his urine which is likely a colonization and does not need to be actively treated. His recent diagnosis of bladder cancer, urinary retention and has a chronic Foley catheter. This will be followed up with urology and cancer Center. The patient is felt safe for discharge to a skilled nursing facility for continued physical therapy.  Procedures:  None  Consultations:  None  Discharge Exam: Filed Vitals:   01/15/13 0354  BP: 130/68  Pulse: 80  Temp: 98.1 F (36.7 C)  Resp: 20    General: No acute distress Cardiovascular: S1, S2, regular rate and rhythm Respiratory: Clear to auscultation bilaterally  Discharge Instructions  Discharge Orders   Future Orders Complete By Expires   Diet - low sodium heart healthy  As directed    Diet Carb Modified  As directed    Increase activity slowly  As directed        Medication List    STOP taking these medications       ciprofloxacin 500 MG tablet  Commonly known as:  CIPRO      glipiZIDE 5 MG tablet  Commonly known as:  GLUCOTROL     metFORMIN 500 MG tablet  Commonly known as:  GLUCOPHAGE      TAKE these medications       amLODipine 10 MG tablet  Commonly known as:  NORVASC  Take 1 tablet (10 mg total) by mouth daily.     aspirin 81 MG chewable tablet  Chew 81 mg by mouth every morning.     lisinopril 10 MG tablet  Commonly known as:  PRINIVIL,ZESTRIL  Take 10 mg by mouth daily.     ONE-A-DAY 50 PLUS Tabs  Take 1 tablet by mouth at bedtime.     traZODone 25 mg Tabs tablet  Commonly known as:  DESYREL  Take 0.5 tablets (25 mg total) by mouth at bedtime as needed for sleep.       No Known Allergies    The results of significant diagnostics from this hospitalization (including imaging, microbiology, ancillary and laboratory) are listed below for reference.    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  History of diabetes and hypertension.  CT ABDOMEN AND PELVIS WITHOUT AND WITH CONTRAST  Technique:  Multidetector CT imaging of the abdomen and pelvis was performed without contrast material in one or both body regions, followed by contrast material(s) and further sections in one or both body regions.  Contrast: OMNIPAQUE IOHEXOL 300 MG/ML  SOLN  Comparison: Renal ultrasound 12/03/2012.  Findings: There are moderate sized dependent pleural effusions bilaterally with associated bibasilar atelectasis.  Coronary artery calcifications are noted.  Precontrast images demonstrate marked atrophy of the left kidney with multiple large calyceal calculi in its lower pole and interpolar region.  No right-sided renal or ureteral calculi are demonstrated.  Postcontrast, the right kidney enhances normally and demonstrates no evidence of mass or urothelial lesion.  There is limited excretion from the left kidney.  No  left renal mass is seen. There is no evidence of ureteral obstruction.  A Foley catheter is in place.  There is air within the bladder. There is irregular bladder wall thickening and perivesical soft tissue stranding.  There are asymmetric calcifications within the right lobe of the prostate gland surrounding an irregular low density mass or fluid collection, measuring 2.7 x 1.7 cm on image 81 of series 6.  The postcontrast images also demonstrate heterogeneous enhancement surrounding the prostatic urethra.  The seminal vesicles appear normal. Some asymmetric enhancement extends along the vas deferens on the left.  There is a preserved fat plane between the rectum and the prostate gland.  No enlarged abdominal pelvic lymph nodes are seen.  The liver, gallbladder, pancreas, spleen and adrenal glands appear normal.  There is moderate aorto iliac atherosclerosis without aneurysm or large vessel occlusion.  No bowel abnormalities are identified. There is bilateral flank edema, worse on the right.  There is diffuse thoracolumbar spondylosis.  IMPRESSION:  1.  Irregular periurethral enhancement extending asymmetrically into the right prostatic lobe concerning for local extension of transitional cell carcinoma.  No direct invasion of the adjacent pelvic structures is identified.  There is some  peripheral enhancement extending along the vas deferens on the left.  2.  Nonspecific bladder wall thickening and perivesical soft tissue stranding.  No pelvic adenopathy or ureteral obstruction identified. 3.  Chronic left renal atrophy with multiple calyceal calculi.  The right kidney demonstrates no significant findings. 4.  No evidence of metastatic disease. 5.  Flank edema with moderate bilateral pleural effusions.   Original Report Authenticated By: Carey Bullocks, M.D.   Dg Chest Port 1 View  12/26/2012   *RADIOLOGY REPORT*  Clinical Data: Fatigue  PORTABLE CHEST - 1 VIEW  Comparison: 12/03/2012  Findings: Cardiomediastinal  silhouette is stable.  No pulmonary edema.  Question trace left pleural effusion with left basilar atelectasis.  No segmental infiltrate.  IMPRESSION: Question trace left pleural effusion with left basilar atelectasis. No segmental infiltrate.  No pulmonary edema.   Original Report Authenticated By: Natasha Mead, M.D.    Microbiology: Recent Results (from the past 240 hour(s))  URINE CULTURE     Status: None   Collection Time    01/10/13  2:26 PM      Result Value Range Status   Specimen Description URINE, CLEAN CATCH   Final   Special Requests NONE   Final   Culture  Setup Time     Final   Value: 01/10/2013 21:07     Performed at [facility] Count     Final   Value: >=100,000 COLONIES/ML     Performed at [facility]   Culture     Final   Value: VANCOMYCIN RESISTANT ENTEROCOCCUS ISOLATED     Note: CRITICAL RESULT CALLED TO, READ BACK BY AND VERIFIED WITH: PATTY CLARK @11AM  ON 8.26 BY BUONO     Performed at [facility]   Report Status 01/13/2013 FINAL   Final   Organism ID, Bacteria VANCOMYCIN RESISTANT ENTEROCOCCUS ISOLATED   Final     Labs: Basic Metabolic Panel:  Recent Labs Lab 01/10/13 1417 01/13/13 1505 01/14/13 0448 01/15/13 0521  NA 138 140 144 143  K 3.2* 3.7 3.2* 3.5  CL 105 105 110 111  CO2 23 24 26 23   GLUCOSE 126* 163* 89 104*  BUN 23 17 17 16   CREATININE 1.15 1.19 1.27 1.25  CALCIUM 7.9* 7.7* 7.7* 7.5*   Liver Function Tests: No results found for this basename: AST, ALT, ALKPHOS, BILITOT, PROT, ALBUMIN,  in the last 168 hours No results found for this basename: LIPASE, AMYLASE,  in the last 168 hours No results found for this basename: AMMONIA,  in the last 168 hours CBC:  Recent Labs Lab 01/10/13 1417 01/13/13 1505  WBC 7.0 9.7  NEUTROABS 5.2 7.6  HGB 10.6* 10.6*  HCT 30.6* 31.0*  MCV 87.7 89.3  PLT 174 192   Cardiac Enzymes: No results found for this basename: CKTOTAL, CKMB, CKMBINDEX, TROPONINI,  in the last  168 hours BNP: BNP (last 3 results) No results found for this basename: PROBNP,  in the last 8760 hours CBG:  Recent Labs Lab 01/14/13 1959 01/14/13 2354 01/15/13 0401 01/15/13 0740 01/15/13 1159  GLUCAP 127* 138* 112* 89 104*       Signed:  Drako Maese  [facility] 01/15/2013, 2:04 PM

## 2013-01-15 NOTE — Evaluation (Signed)
Physical Therapy Evaluation Patient Details Name: Charles Gibbs MRN: [id number] DOB: [birth year] Today's Date: 01/15/2013 Time: 4098-1191 PT Time Calculation (min): 30 min  PT Assessment / Plan / Recommendation History of Present Illness  Pt is admitted , once again, with failure to thrive and abnormal blood sugar levels.  Per family, he has not been faring well at home...they even had to call 911 to get him into the house after last admission (he couldn't ascend 1 step into the entrance).  Clinical Impression   Pt is seen for evaluation and is found to be generally weak and deconditioned.  He is not capable of managing to live independently at this point and would benefit from SNF so that he can return to home/independence.    PT Assessment  Patient needs continued PT services    Follow Up Recommendations  SNF    Does the patient have the potential to tolerate intense rehabilitation      Barriers to Discharge Decreased caregiver support      Equipment Recommendations  None recommended by PT    Recommendations for Other Services     Frequency Min 3X/week    Precautions / Restrictions Precautions Precautions: Fall Restrictions Weight Bearing Restrictions: No   Pertinent Vitals/Pain       Mobility  Bed Mobility Bed Mobility: Supine to Sit Supine to Sit: 3: Mod assist;HOB flat Details for Bed Mobility Assistance: too weak to slide legs over toward EOB or to push up with UEs Transfers Sit to Stand: With upper extremity assist;From bed;3: Mod assist Stand to Sit: 4: Min assist;With upper extremity assist Ambulation/Gait Ambulation/Gait Assistance: 4: Min assist Ambulation Distance (Feet): 80 Feet Assistive device: Rolling walker Ambulation/Gait Assistance Details: slow and labored...some dyspnea with gait Gait Pattern: Shuffle;Trunk flexed Stairs: No Wheelchair Mobility Wheelchair Mobility: No    Exercises General Exercises - Lower Extremity Ankle Circles/Pumps:  AROM;Both;10 reps;Supine Quad Sets: AROM;10 reps;Both;Supine Gluteal Sets: AROM;Both;10 reps;Supine Short Arc Quad: AROM;Both;10 reps;Supine Heel Slides: AAROM;Both;10 reps;Supine Hip ABduction/ADduction: AROM;Both;10 reps;Supine   PT Diagnosis: Difficulty walking;Generalized weakness  PT Problem List: Decreased strength;Decreased activity tolerance;Decreased balance;Decreased mobility PT Treatment Interventions: Gait training;Stair training;Therapeutic activities;Therapeutic exercise     PT Goals(Current goals can be found in the care plan section) Acute Rehab PT Goals Patient Stated Goal: wants to go home PT Goal Formulation: With patient/family Time For Goal Achievement: 01/29/13 Potential to Achieve Goals: Good  Visit Information  Last PT Received On: 01/15/13 History of Present Illness: Pt is admitted , once again, with failure to thrive and abnormal blood sugar levels.  Per family, he has not been faring well at home...they even had to call 911 to get him into the house after last admission (he couldn't ascend 1 step into the entrance).       Prior Functioning  Home Living Family/patient expects to be discharged to:: Skilled nursing facility Prior Function Level of Independence: Needs assistance Gait / Transfers Assistance Needed: has assistive devices, but reports that he does not use them at home.Marland KitchenMarland KitchenMy best guess is that he rarely walks at home ADL's / Homemaking Assistance Needed: needs assist with all ADLs...has Meals on Wheels for one meal, family assists as able Communication Communication: HOH    Cognition  Cognition Arousal/Alertness: Awake/alert Behavior During Therapy: WFL for tasks assessed/performed Overall Cognitive Status: Within Functional Limits for tasks assessed    Extremity/Trunk Assessment Upper Extremity Assessment Upper Extremity Assessment: Generalized weakness Lower Extremity Assessment Lower Extremity Assessment: Generalized weakness Cervical  / Trunk Assessment  Cervical / Trunk Assessment: Kyphotic   Balance Balance Balance Assessed: Yes Static Sitting Balance Static Sitting - Balance Support: Bilateral upper extremity supported Static Sitting - Level of Assistance: 5: Stand by assistance (tends to fall backward)  End of Session PT - End of Session Equipment Utilized During Treatment: Gait belt Activity Tolerance: Patient tolerated treatment well Patient left: in chair;with call bell/phone within reach;with chair alarm set;with family/visitor present Nurse Communication: Mobility status  GP Functional Assessment Tool Used: clinical judgement Functional Limitation: Mobility: Walking and moving around Mobility: Walking and Moving Around Current Status (W0981): At least 20 percent but less than 40 percent impaired, limited or restricted Mobility: Walking and Moving Around Goal Status [phone]): At least 1 percent but less than 20 percent impaired, limited or restricted   [name] 01/15/2013, 10:40 AM

## 2013-01-16 NOTE — ED Notes (Signed)
Rx for Macrobid 100 mg tabs 1 tab po BID  Disp #10 per Marlon Pel PAC.

## 2013-01-18 DIAGNOSIS — IMO0001 Reserved for inherently not codable concepts without codable children: Secondary | ICD-10-CM

## 2013-01-18 DIAGNOSIS — E1165 Type 2 diabetes mellitus with hyperglycemia: Secondary | ICD-10-CM

## 2013-01-18 DIAGNOSIS — M6282 Rhabdomyolysis: Secondary | ICD-10-CM

## 2013-01-18 DIAGNOSIS — R339 Retention of urine, unspecified: Secondary | ICD-10-CM

## 2013-01-20 NOTE — Telephone Encounter (Signed)
Put on the phone for bill to speak with

## 2013-02-06 IMAGING — CR DG ELBOW COMPLETE 3+V*R*
4 series · 4 of 4 positions shown · non-contrast
Comparison: Left elbow examination.

CLINICAL DATA: History of injury from fall 1 day previously.  Pain.

RIGHT ELBOW - COMPLETE 3+ VIEW

[view not recorded (1 of 4)]
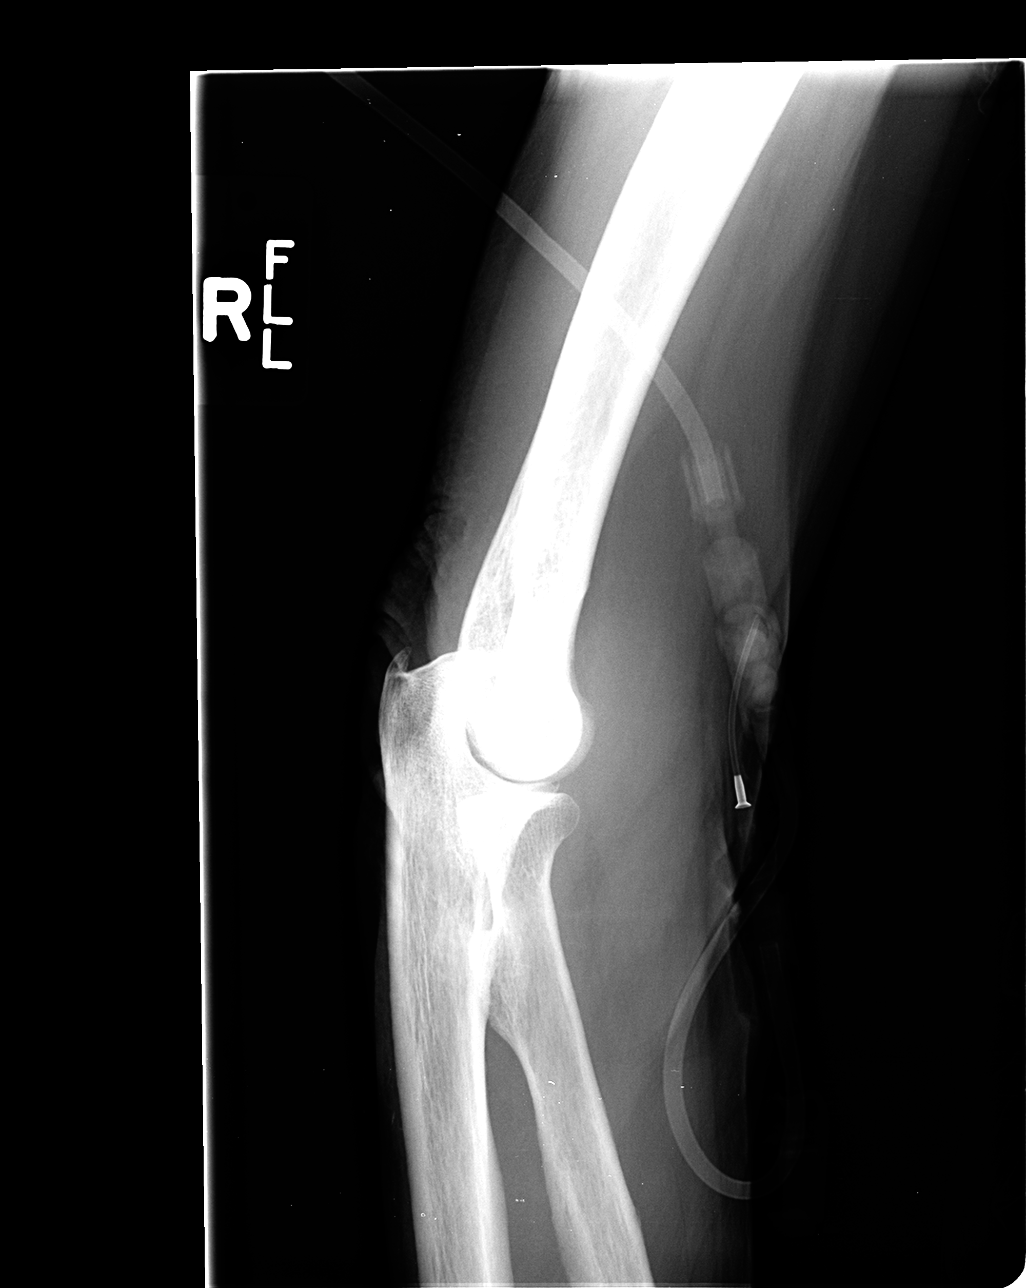

[view not recorded (2 of 4)]
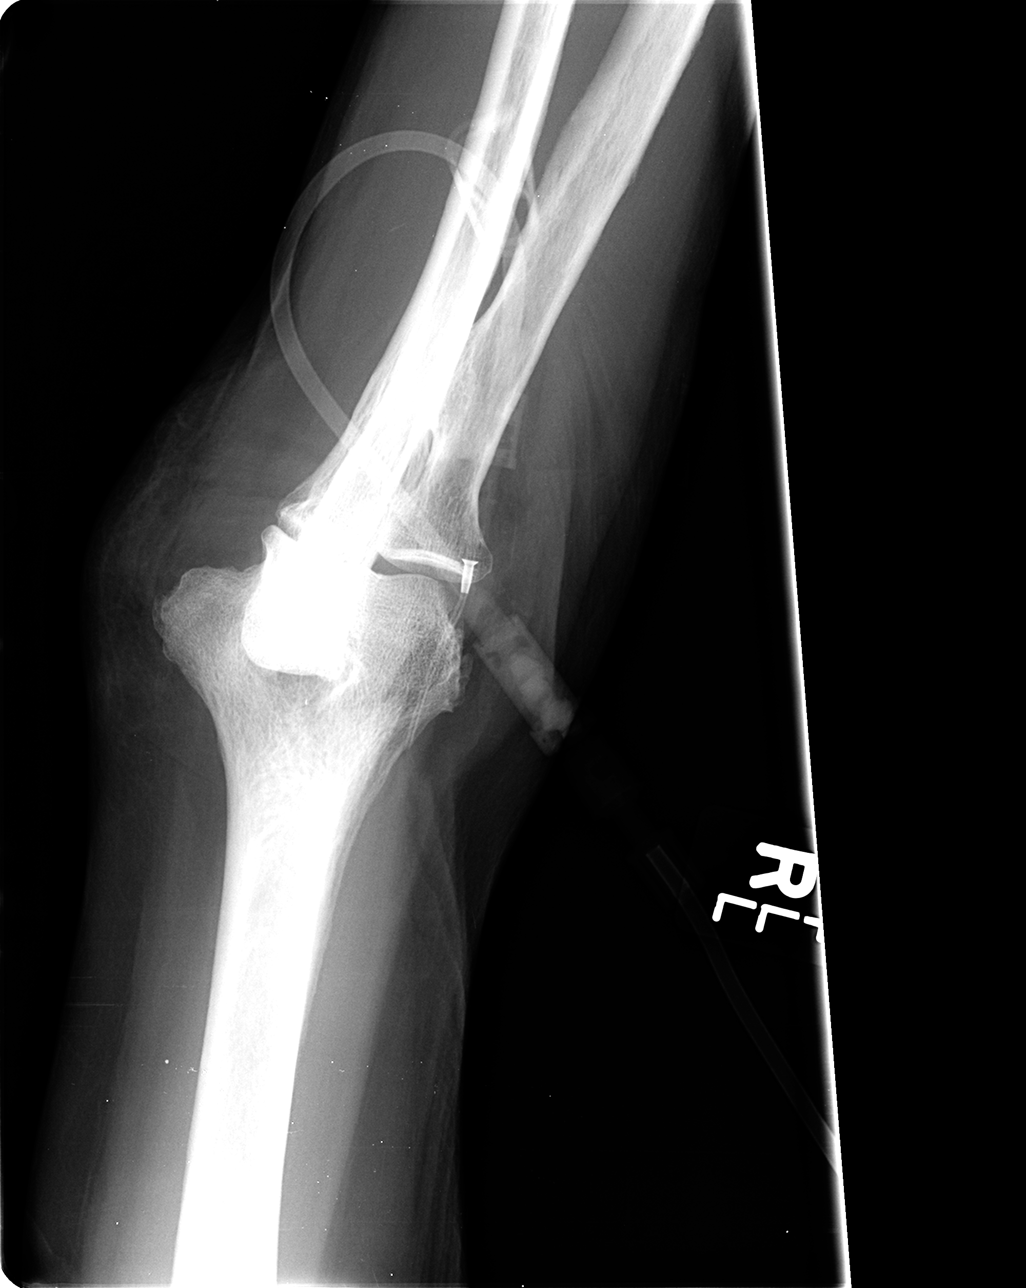

[view not recorded (3 of 4)]
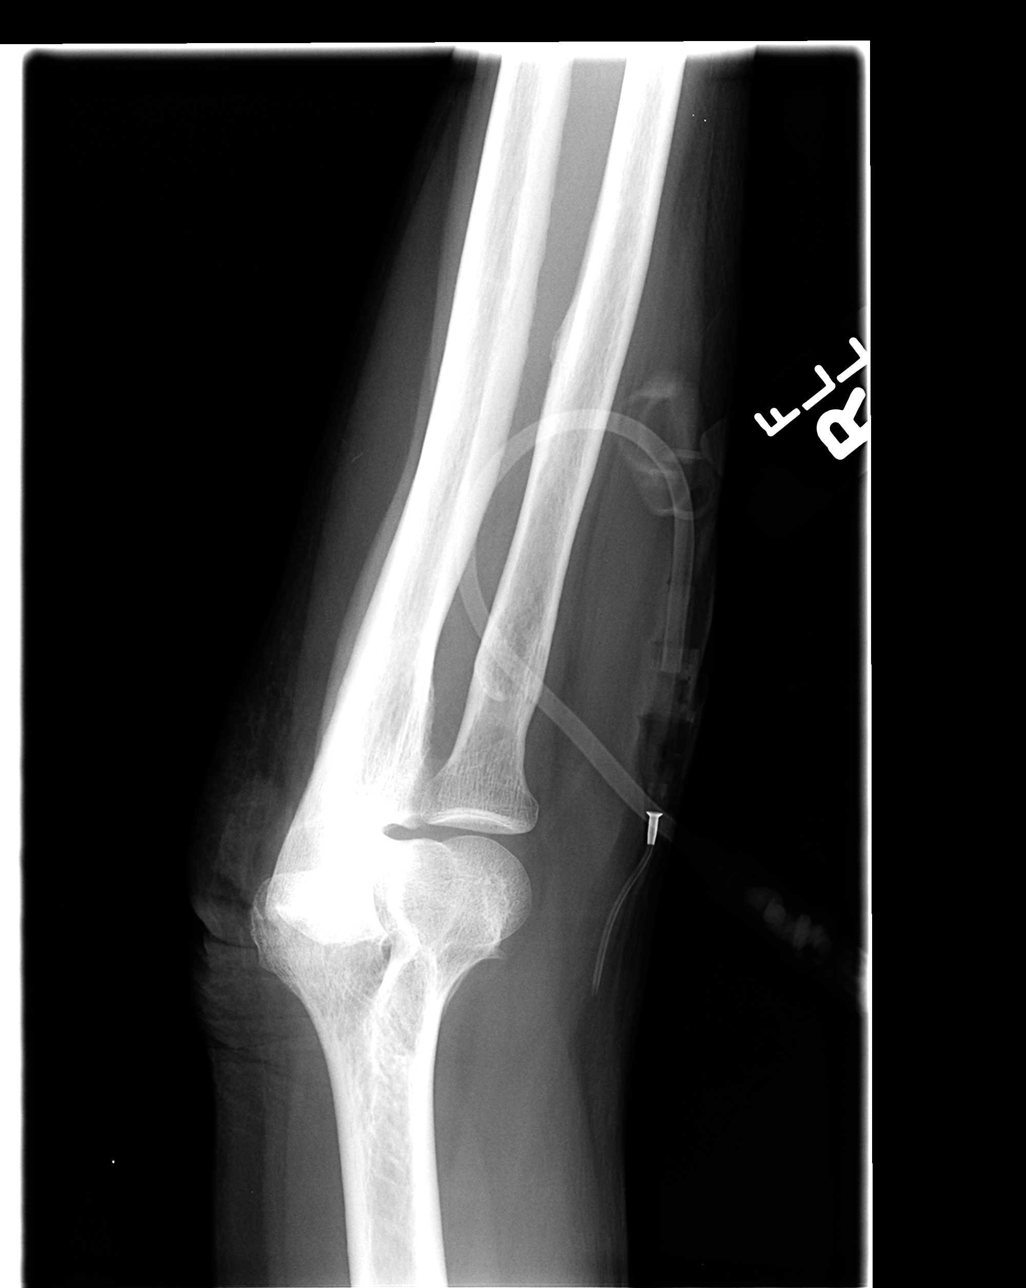

[view not recorded (4 of 4)]
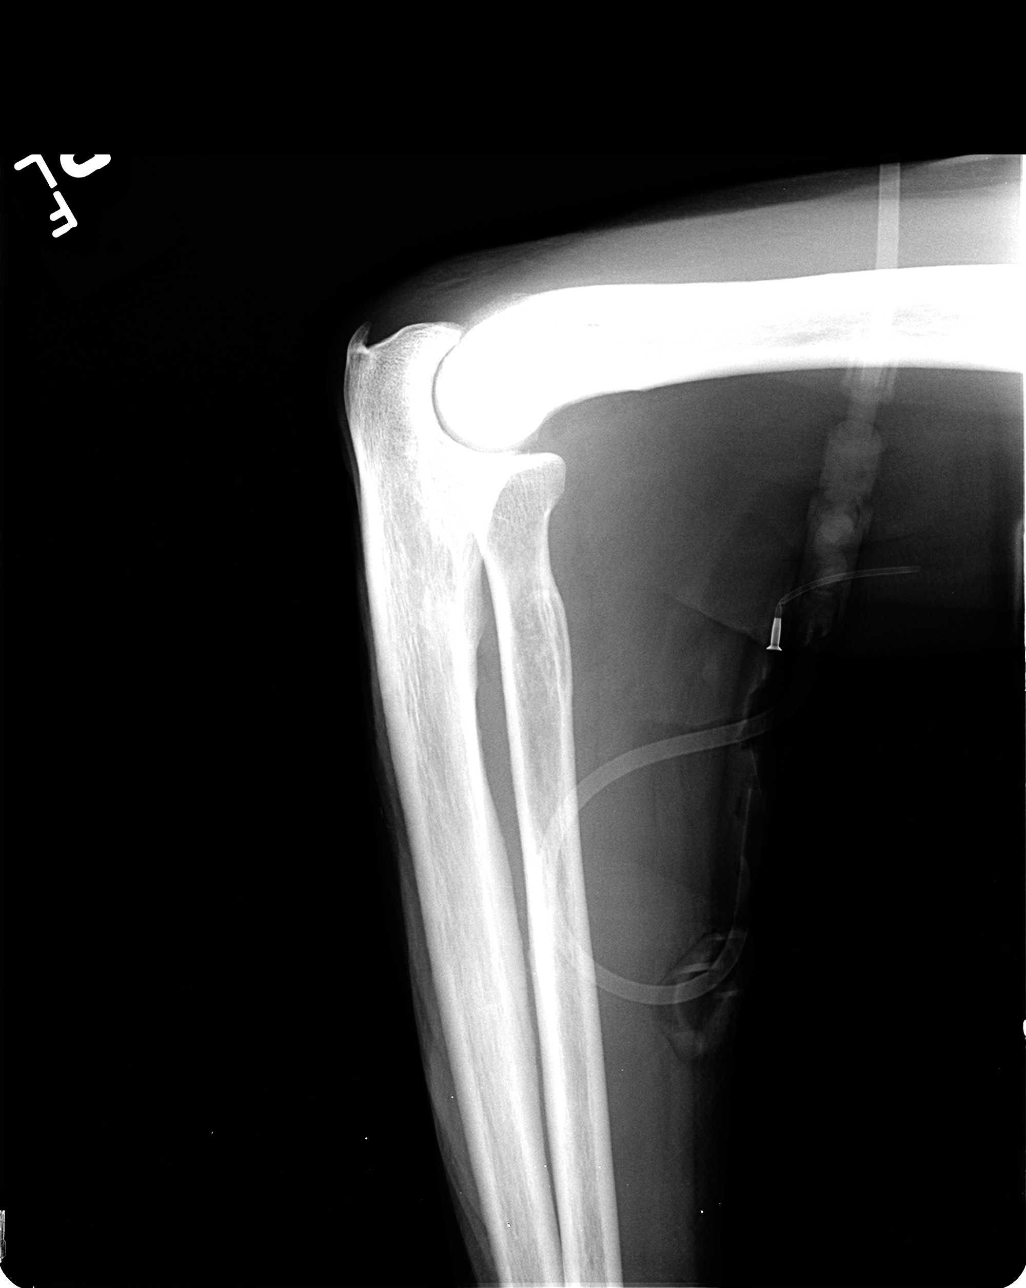

[4 of 4 positions shown; findings below may reference images not displayed]

FINDINGS: There is no positive fat pad sign to suggest joint
effusion.  Alignment is normal.  No fracture or dislocation is
evident.  There is spurring of the olecranon posteriorly.  There is
enthesophyte spurring of the medial and lateral epicondyles.
IMPRESSION: No fracture or dislocation evident.  Degenerative spurring.

## 2013-02-06 IMAGING — CR DG PELVIS 1-2V
1 series · 1 of 1 positions shown · non-contrast
Comparison: None.

CLINICAL DATA: Status post fall.  Pain.

PELVIS - 1-2 VIEW

[view not recorded]
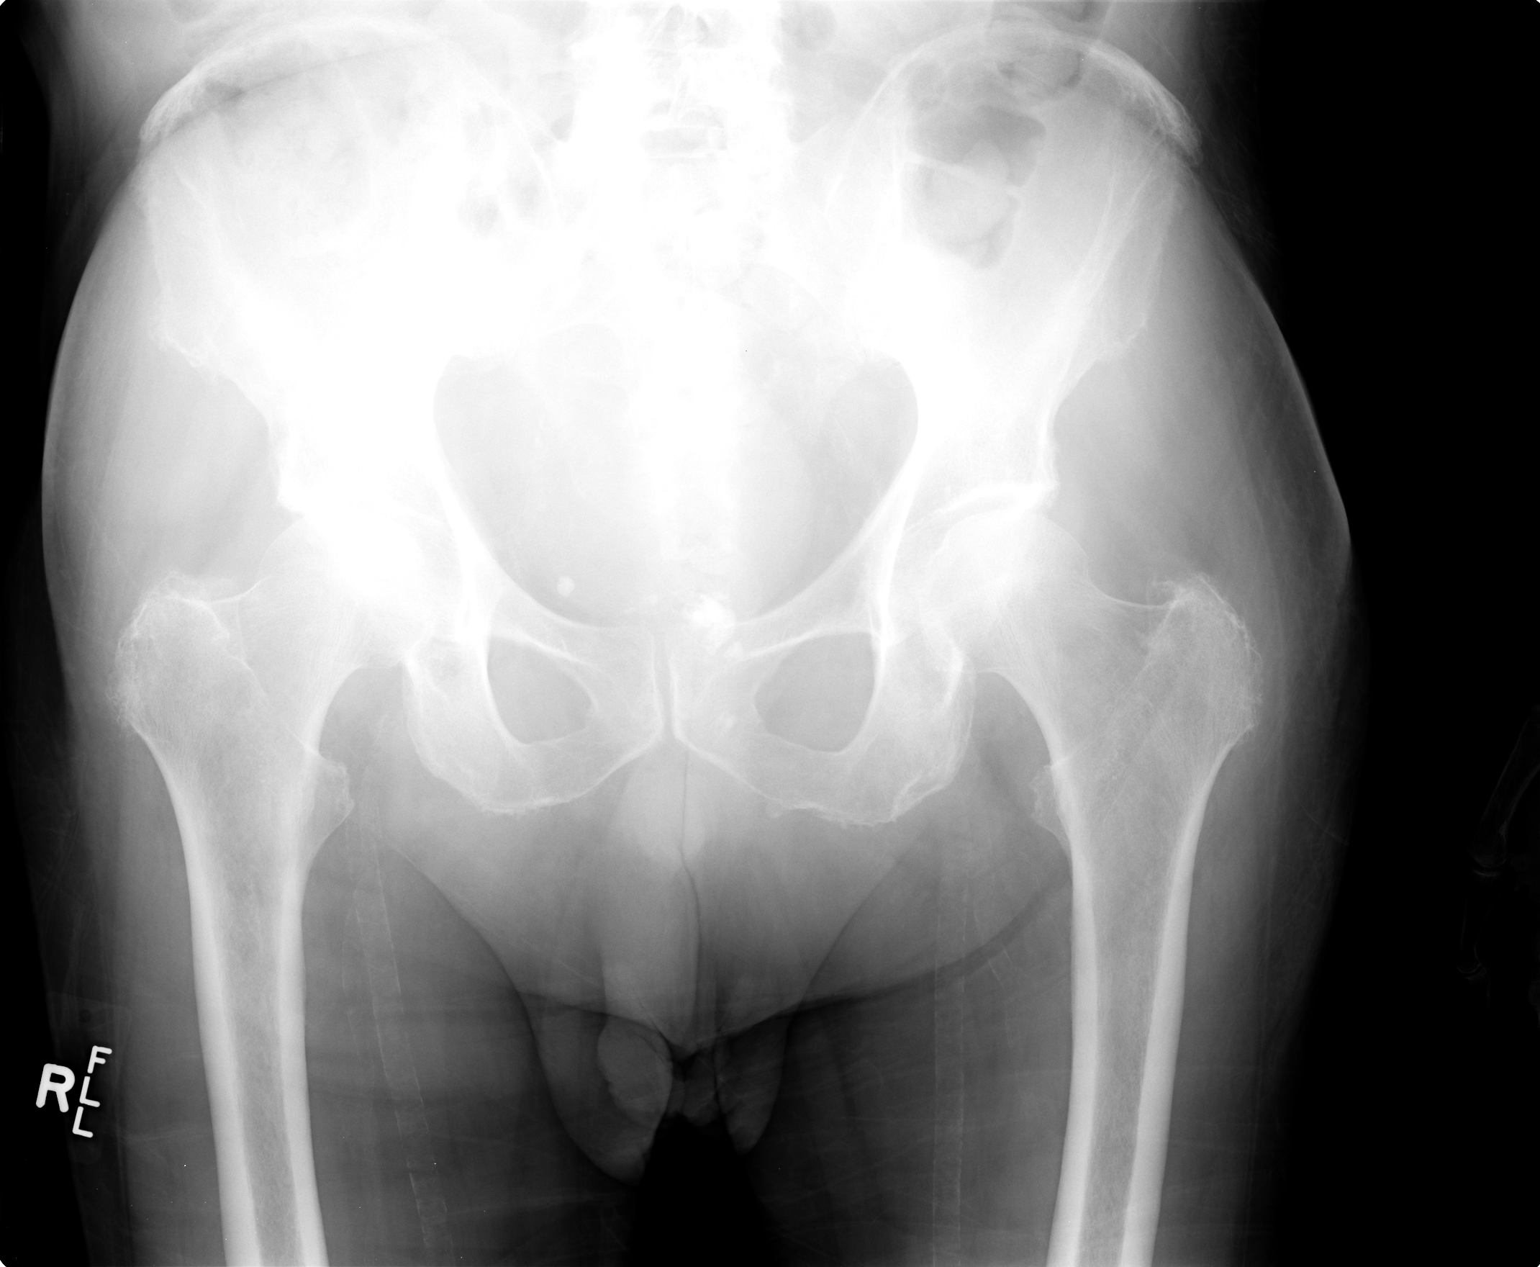

[1 of 1 positions shown; findings below may reference images not displayed]

FINDINGS: The mineralization and alignment are normal.  There is no
evidence of acute fracture or dislocation.  There are mild
degenerative changes of the sacroiliac joints and hips.
Calcifications overlying the symphysis pubis are asymmetric to the
left but likely reflect prostatic calcifications and phleboliths.
There are femoral arterial vascular calcifications bilaterally.
IMPRESSION: No acute osseous findings.

## 2013-02-06 IMAGING — CR DG ELBOW COMPLETE 3+V*L*
4 series · 4 of 4 positions shown · non-contrast
Comparison: Right elbow examination of same date.

CLINICAL DATA: Injury from fall 1 day previously with pain.

LEFT ELBOW - COMPLETE 3+ VIEW

[view not recorded (1 of 4)]
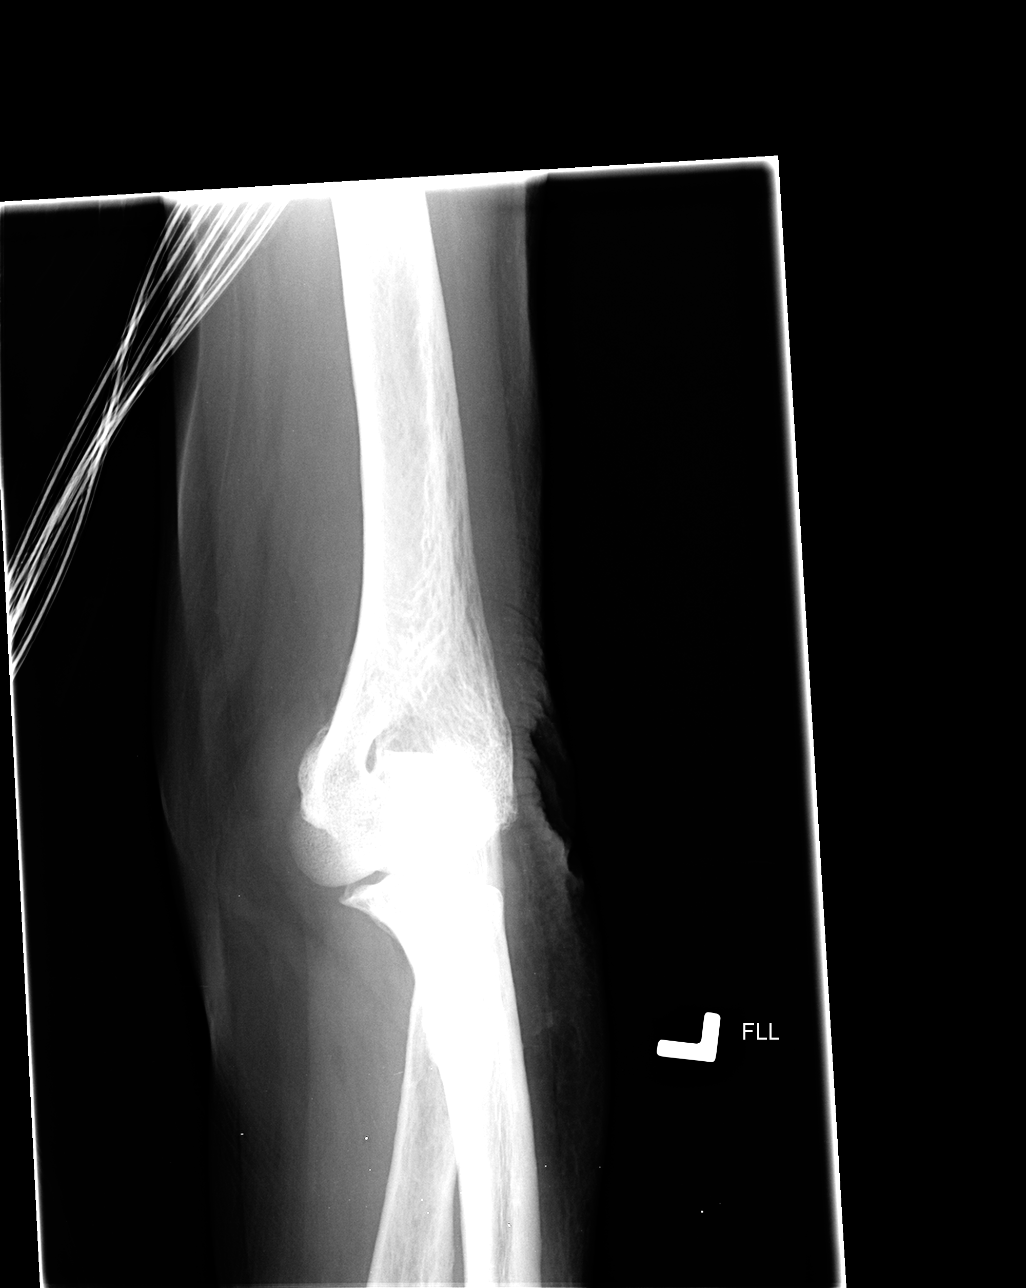

[view not recorded (2 of 4)]
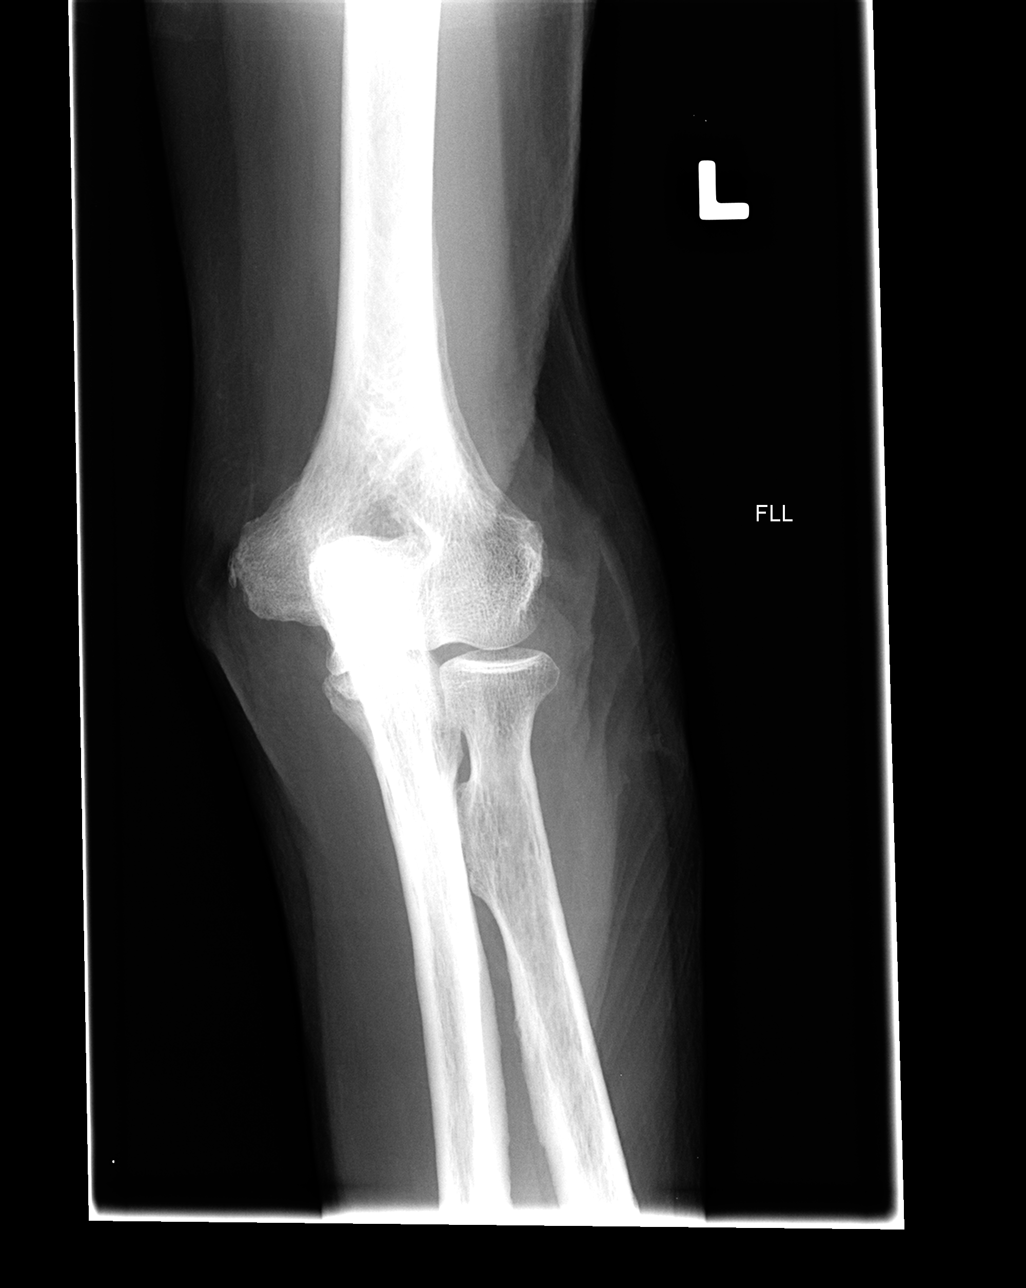

[view not recorded (3 of 4)]
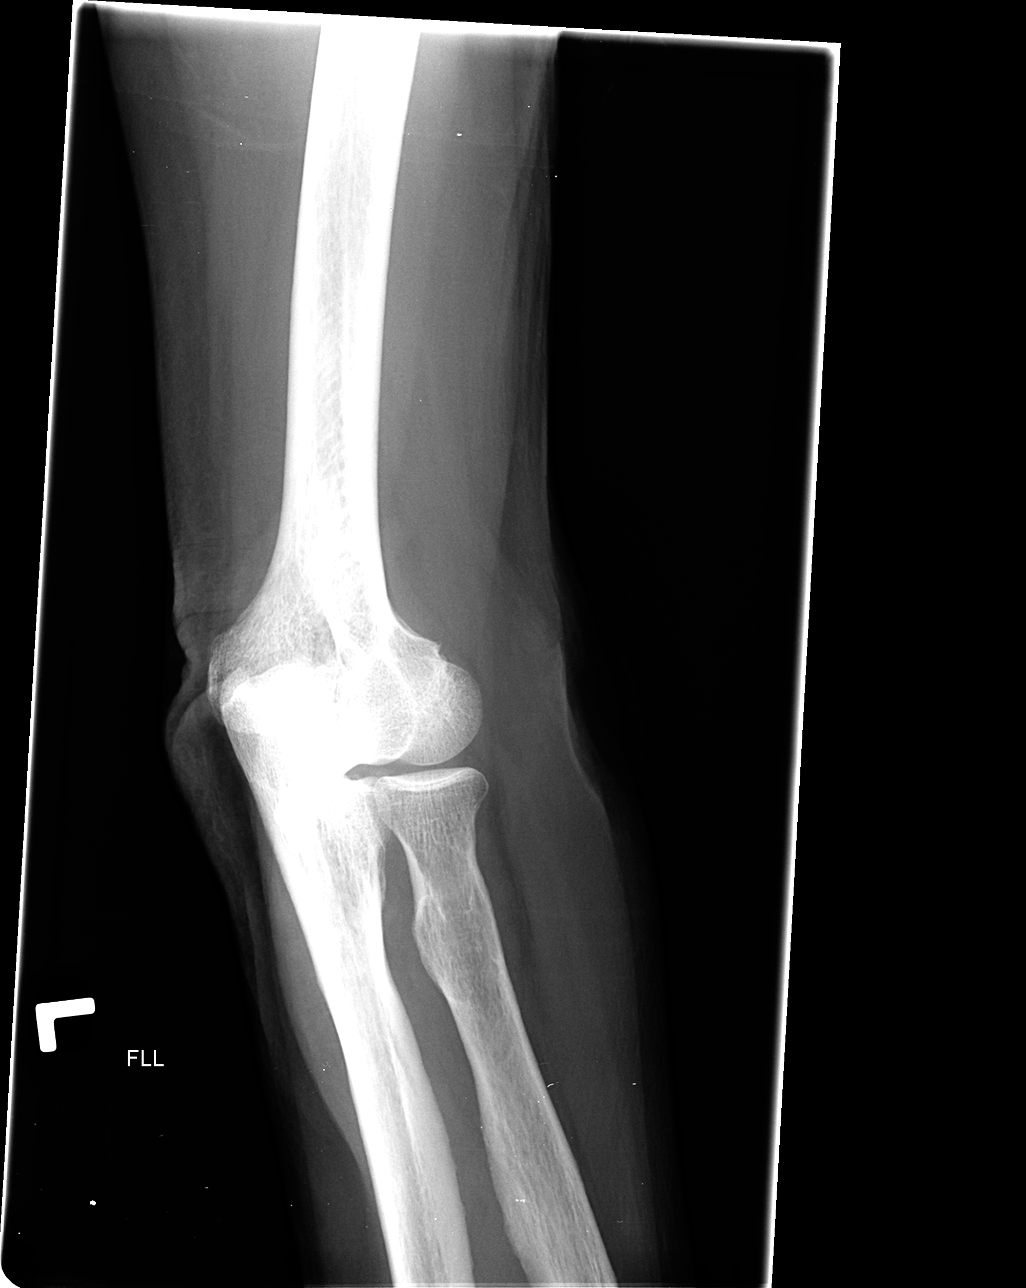

[view not recorded (4 of 4)]
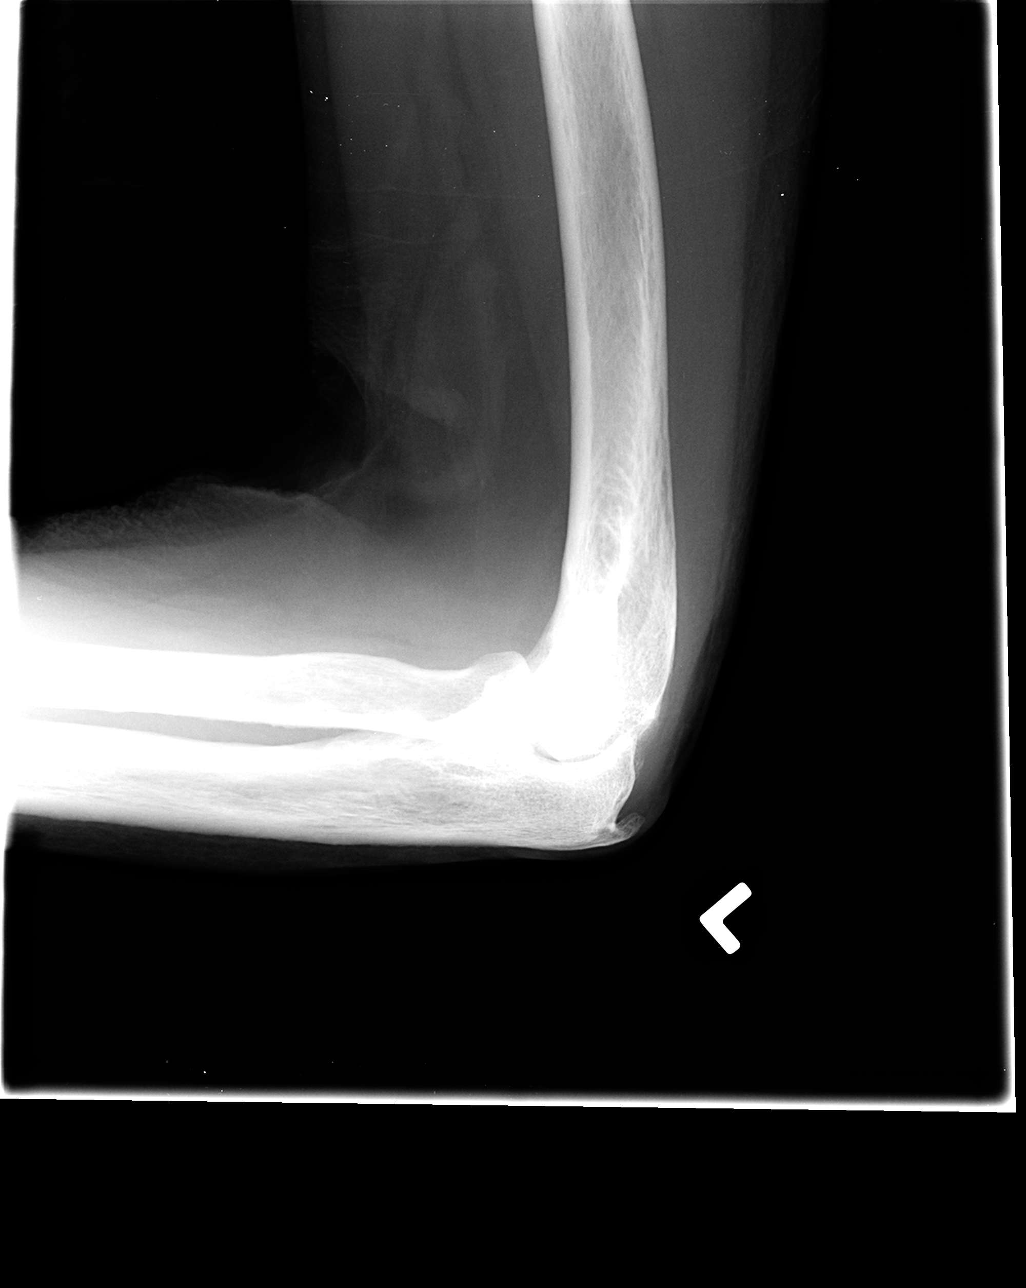

[4 of 4 positions shown; findings below may reference images not displayed]

FINDINGS: There is no positive fat pad sign to suggest joint
effusion.  Alignment is normal.  No fracture or dislocation is
evident.  There is posterior olecranon spurring.  There is slight
spurring of the medial and lateral epicondyles.
IMPRESSION: No fracture or dislocation evident.  No evidence of joint effusion.
Minimal spurring.

## 2013-06-02 ENCOUNTER — Other Ambulatory Visit (HOSPITAL_COMMUNITY): Payer: Self-pay | Admitting: Urology

## 2013-06-02 DIAGNOSIS — N201 Calculus of ureter: Secondary | ICD-10-CM

## 2013-06-05 ENCOUNTER — Ambulatory Visit (HOSPITAL_COMMUNITY)
Admission: RE | Admit: 2013-06-05 | Discharge: 2013-06-05 | Disposition: A | Discharge status: Home / Self Care | Payer: PRIVATE HEALTH INSURANCE | Source: Ambulatory Visit | Attending: Urology | Admitting: Urology

## 2013-06-26 ENCOUNTER — Encounter: Payer: Self-pay | Admitting: Vascular Surgery

## 2013-06-29 ENCOUNTER — Ambulatory Visit (INDEPENDENT_AMBULATORY_CARE_PROVIDER_SITE_OTHER): Payer: PRIVATE HEALTH INSURANCE | Admitting: Vascular Surgery

## 2013-06-29 ENCOUNTER — Encounter: Payer: Self-pay | Admitting: Vascular Surgery

## 2013-06-29 VITALS — BP 115/68 | HR 88 | Resp 18 | Ht 66.0 in | Wt 118.6 lb

## 2013-06-29 DIAGNOSIS — I739 Peripheral vascular disease, unspecified: Secondary | ICD-10-CM

## 2013-06-29 DIAGNOSIS — I70209 Unspecified atherosclerosis of native arteries of extremities, unspecified extremity: Secondary | ICD-10-CM | POA: Insufficient documentation

## 2013-06-29 DIAGNOSIS — L98499 Non-pressure chronic ulcer of skin of other sites with unspecified severity: Secondary | ICD-10-CM

## 2013-06-29 NOTE — Progress Notes (Signed)
Subjective:     Patient ID: Charles Gibbs, male   DOB: November 12, 1927, 80 y.o.   MRN: [id number]  HPI this 80 year old male was referred by Dr. Jani Gravel for evaluation of extensive ulcer left heel. Patient is a resident of nursing facility in [place]. He is accompanied by health care worker from that facility he states that the ulceration has been present for several months but enlarging. Patient is nonambulatory for a long period of time. He does have diabetes mellitus. He does have a daughter who visits him periodically but is not present today. Past Medical History  Diagnosis Date  . Diabetes mellitus   . Inner ear inflammation   . Vertigo   . Urinary retention 12/04/2012  . Bladder stone 12/09/2012    S/p CYSTOSCOPY WITH LITHOLAPAXY (N/A)  . Hypertension   . Cancer     kidney cancer    History  Substance Use Topics  . Smoking status: Never Smoker   . Smokeless tobacco: Never Used  . Alcohol Use: No    Family History  Problem Relation Age of Onset  . Stroke Mother   . Cancer Mother   . Heart failure Mother   . Heart failure Father     No Known Allergies  Current outpatient prescriptions:amLODipine (NORVASC) 10 MG tablet, Take 1 tablet (10 mg total) by mouth daily., Disp: 30 tablet, Rfl: 2;  aspirin 81 MG chewable tablet, Chew 81 mg by mouth every morning. , Disp: , Rfl: ;  lisinopril (PRINIVIL,ZESTRIL) 10 MG tablet, Take 10 mg by mouth daily., Disp: , Rfl: ;  Multiple Vitamins-Minerals (ONE-A-DAY 50 PLUS) TABS, Take 1 tablet by mouth at bedtime., Disp: , Rfl:  traZODone (DESYREL) 25 mg TABS tablet, Take 0.5 tablets (25 mg total) by mouth at bedtime as needed for sleep., Disp: 30 tablet, Rfl: 2  BP 115/68  Pulse 88  Resp 18  Ht 5\' 6"  (1.676 m)  Wt 118 lb 9.6 oz (53.797 kg)  BMI 19.15 kg/m2  Body mass index is 19.15 kg/(m^2).           Review of Systems as chest pain, dyspnea on exertion. Patient does not ambulate. No history of coronary artery disease or stroke  according to patient. Otherwise review of systems does not appear to be adequate with this patient.    Objective:   Physical Exam BP 115/68  Pulse 88  Resp 18  Ht 5\' 6"  (1.676 m)  Wt 118 lb 9.6 oz (53.797 kg)  BMI 19.15 kg/m2  Gen.-alert and oriented x3 in no apparent distress-elderly-does not answer questions consistently-in a wheelchair-chronically ill in appearance HEENT normal for age Lungs no rhonchi or wheezing Cardiovascular regular rhythm no murmurs carotid pulses 3+ palpable no bruits audible Abdomen soft nontender no palpable masses Musculoskeletal free of  major deformities Skin clear -no rashes Neurologic normal Lower extremities 3+ femoral and 2+ popliteal pulses palpable bilaterally. Left lower extremity has extensive ulceration on the heel measuring 5 x 6 cm with no exposed bone. There is also a second ulceration over Achilles tendon. There is brisk flow in PT and DP by Doppler exam. Right leg is has no ulcerations.  Arterial Dopplers were performed at an outside facility which revealed an ABI on the left of 1.2 with calcified tibial vessels  \     Assessment:     Extensive ulceration left heel and patient with diabetes mellitus and severe tibial occlusive disease-nonambulatory-not candidate for revascularization of severe tibial disease    Plan:  Patient will need left below-knee dictation at some point in time. No evidence of infection. Patient has no interest in proceeding with that at the present time-if patient's changes decision or this becomes infected and there is need to proceed we could schedule him for left below-knee amputation-we'll have him admitted by hospitalist service when the time comes

## 2013-08-11 ENCOUNTER — Encounter (HOSPITAL_COMMUNITY): Payer: Self-pay | Admitting: *Deleted

## 2013-08-11 ENCOUNTER — Encounter (HOSPITAL_COMMUNITY)
Admission: RE | Disposition: A | Discharge status: Home / Self Care | Payer: Self-pay | Source: Ambulatory Visit | Attending: Urology

## 2013-08-11 ENCOUNTER — Ambulatory Visit (HOSPITAL_COMMUNITY)
Admission: RE | Admit: 2013-08-11 | Discharge: 2013-08-11 | Disposition: A | Discharge status: Home / Self Care | Payer: PRIVATE HEALTH INSURANCE | Source: Ambulatory Visit | Attending: Urology | Admitting: Urology

## 2013-08-11 DIAGNOSIS — N400 Enlarged prostate without lower urinary tract symptoms: Secondary | ICD-10-CM | POA: Insufficient documentation

## 2013-08-11 DIAGNOSIS — N4889 Other specified disorders of penis: Secondary | ICD-10-CM | POA: Insufficient documentation

## 2013-08-11 DIAGNOSIS — C689 Malignant neoplasm of urinary organ, unspecified: Secondary | ICD-10-CM | POA: Insufficient documentation

## 2013-08-11 DIAGNOSIS — I10 Essential (primary) hypertension: Secondary | ICD-10-CM | POA: Insufficient documentation

## 2013-08-11 DIAGNOSIS — Z79899 Other long term (current) drug therapy: Secondary | ICD-10-CM | POA: Insufficient documentation

## 2013-08-11 DIAGNOSIS — N211 Calculus in urethra: Secondary | ICD-10-CM | POA: Insufficient documentation

## 2013-08-11 DIAGNOSIS — Z7982 Long term (current) use of aspirin: Secondary | ICD-10-CM | POA: Insufficient documentation

## 2013-08-11 DIAGNOSIS — R339 Retention of urine, unspecified: Secondary | ICD-10-CM | POA: Insufficient documentation

## 2013-08-11 HISTORY — PX: CYSTOSCOPY: SHX5120

## 2013-08-11 LAB — GLUCOSE, CAPILLARY: GLUCOSE-CAPILLARY: 208 mg/dL — AB (ref 70–99)

## 2013-08-11 SURGERY — CYSTOSCOPY, FLEXIBLE
Anesthesia: LOCAL | Site: Bladder

## 2013-08-11 MED ORDER — BACITRACIN ZINC 500 UNIT/GM EX OINT
TOPICAL_OINTMENT | CUTANEOUS | Status: AC
  Filled 2013-08-11: qty 1.8

## 2013-08-11 MED ORDER — LIDOCAINE HCL 2 % EX GEL
CUTANEOUS | Status: DC | PRN
  Administered 2013-08-11: 1 via URETHRAL

## 2013-08-11 MED ORDER — LIDOCAINE HCL 2 % EX GEL
CUTANEOUS | Status: AC
  Filled 2013-08-11: qty 10

## 2013-08-11 MED ORDER — SODIUM CHLORIDE 0.9 % IR SOLN
Status: DC | PRN
  Administered 2013-08-11: 1000 mL

## 2013-08-11 MED ORDER — STERILE WATER FOR IRRIGATION IR SOLN
Status: DC | PRN
  Administered 2013-08-11: 1000 mL

## 2013-08-11 MED ORDER — LIDOCAINE HCL (PF) 1 % IJ SOLN
INTRAMUSCULAR | Status: DC | PRN
  Administered 2013-08-11: 1 mL

## 2013-08-11 MED ORDER — LIDOCAINE HCL (PF) 1 % IJ SOLN
INTRAMUSCULAR | Status: AC
  Filled 2013-08-11: qty 30

## 2013-08-11 SURGICAL SUPPLY — 22 items
CLOTH BEACON ORANGE TIMEOUT ST (SAFETY) ×2 IMPLANT
DECANTER SPIKE VIAL GLASS SM (MISCELLANEOUS) ×2 IMPLANT
DRAPE LAPAROTOMY TRNSV 102X78 (DRAPE) ×2 IMPLANT
DRAPE PROXIMA HALF (DRAPES) ×2 IMPLANT
GLOVE BIO SURGEON STRL SZ7 (GLOVE) ×2 IMPLANT
GLOVE BIOGEL PI IND STRL 7.5 (GLOVE) ×2 IMPLANT
GLOVE BIOGEL PI INDICATOR 7.5 (GLOVE) ×2
GLOVE ECLIPSE 7.0 STRL STRAW (GLOVE) ×2 IMPLANT
GOWN STRL REUS W/TWL LRG LVL3 (GOWN DISPOSABLE) ×4 IMPLANT
IV NS 1000ML (IV SOLUTION) ×1
IV NS 1000ML BAXH (IV SOLUTION) ×1 IMPLANT
MARKER SKIN DUAL TIP RULER LAB (MISCELLANEOUS) ×2 IMPLANT
NEEDLE HYPO 25X1 1.5 SAFETY (NEEDLE) ×2 IMPLANT
PAD TELFA 3X4 1S STER (GAUZE/BANDAGES/DRESSINGS) ×2 IMPLANT
SET IRRIG Y TYPE TUR BLADDER L (SET/KITS/TRAYS/PACK) ×2 IMPLANT
SPONGE GAUZE 4X4 12PLY (GAUZE/BANDAGES/DRESSINGS) ×2 IMPLANT
SYR CONTROL 10ML LL (SYRINGE) ×2 IMPLANT
TOWEL OR 17X26 4PK STRL BLUE (TOWEL DISPOSABLE) ×2 IMPLANT
TRAY FOLEY CATH 16FR SILVER (SET/KITS/TRAYS/PACK) ×2 IMPLANT
VALVE DISPOSABLE (MISCELLANEOUS) ×2 IMPLANT
WATER STERILE IRR 1000ML POUR (IV SOLUTION) ×2 IMPLANT
YANKAUER SUCT BULB TIP 10FT TU (MISCELLANEOUS) ×2 IMPLANT

## 2013-08-11 NOTE — OR Nursing (Signed)
Patient has wound  To left foot .  kerlex to left foot with bloody drainage , odor noted to left foot

## 2013-08-11 NOTE — Brief Op Note (Signed)
08/11/2013  10:17 AM  PATIENT:  Charles Gibbs  80 y.o. male  PRE-OPERATIVE DIAGNOSIS:  acute urinary retention  POST-OPERATIVE DIAGNOSIS:  acute urinary retention, small stone in urethra, transistional prostatic urethra, growth near meatus, bph  PROCEDURE:  Procedure(s): CYSTOSCOPY FLEXIBLE (N/A)  SURGEON:  Surgeon(s) and Role:    * Marissa Nestle, MD - Primary  PHYSICIAN ASSISTANT:   ASSISTANTS: none   ANESTHESIA:   local  EBL:     BLOOD ADMINISTERED:none  DRAINS: Urinary Catheter (Foley)   LOCAL MEDICATIONS USED:  NONE  SPECIMEN:  Source of Specimen:  tissue around meatus  DISPOSITION OF SPECIMEN:  PATHOLOGY  COUNTS:  YES  TOURNIQUET:  * No tourniquets in log *  DICTATION: .Other Dictation: Dictation Number dictation [id number]  PLAN OF CARE: Discharge to home after PACU  PATIENT DISPOSITION:  PACU - hemodynamically stable.   Delay start of Pharmacological VTE agent (>24hrs) due to surgical blood loss or risk of bleeding:

## 2013-08-11 NOTE — Op Note (Signed)
NAME:  Charles Gibbs, Charles Gibbs NO.:  [id number]  MEDICAL RECORD NO.:  [id number]  LOCATION:  [facility]                          FACILITY:  [facility]  PHYSICIAN:  Marissa Nestle, M.D.DATE OF BIRTH:  20-Nov-1927  DATE OF PROCEDURE: DATE OF DISCHARGE:  08/11/2013                              OPERATIVE REPORT   PREOPERATIVE DIAGNOSES:  Transitional cell carcinoma, prostatic urethra, benign prostatic hypertrophy.  POSTOPERATIVE DIAGNOSES:  Transitional cell carcinoma, prostatic urethra, benign prostatic hypertrophy.  PROCEDURE:  Cystoscopy with flexible cystoscope, and biopsy of the tissue around meatus, attempt to remove prostatic urethral calculus.  DESCRIPTION OF PROCEDURE:  The patient was placed in supine position under local anesthesia which was instilled into his urethra.  After waiting adequate time, flexible cystoscope was introduced under direct vision.  Bladder was then inspected.  No tumor, stone, foreign body, or inflammation was seen in the bladder.  The prostatic urethra is partially obstructed with enlarged prostate.  There is a papillary growth in the prostatic urethra, and there is a small stone also in that area.  I tried to grab the stone with the help of the flexible forceps, but I cannot grab it.  I will have to have rigid instruments to remove it.  There is some granulation tissue like material around the meatus, which was biopsied under local anesthesia.  After injecting 0.5 mL of 1% Xylocaine, I simply cut it off with the help of a scissor and a pickup. Pressure was applied to stop the bleeding, which stopped.  Foley catheter was reinserted.  The patient left the operating room in satisfactory condition.  I think, I will have to do a TURP to remove the transitional cell carcinoma from the urethra.  He was given a voiding trial again.  He says he is not able to void.  Foley catheter was reinserted.  The patient left the operating room in  satisfactory condition.     Marissa Nestle, M.D.     MIJ/MEDQ  D:  08/11/2013  T:  08/11/2013  Job:  [id number]

## 2013-08-11 NOTE — H&P (Signed)
NAME:  Charles Gibbs, Charles Gibbs NO.:  [id number]  MEDICAL RECORD NO.:  [id number]  LOCATION:  [facility]                         FACILITY:  [facility]  PHYSICIAN:  Marissa Nestle, M.D.DATE OF BIRTH:  03-01-1928  DATE OF ADMISSION:  08/04/2013 DATE OF DISCHARGE:  [facility]                             HISTORY & PHYSICAL   CHIEF COMPLAINT:  History of urinary retention.  HISTORY:  An 80 year old gentleman who was seen by me last year in July and he was found to be in urinary retention.  I inserted a Foley catheter, 800 mL of urine was removed.  Subsequently, he was admitted and I did a cystoscopy in the hospital and he has a large bladder stone, which I subsequently removed with litholapaxy.  He also had papillary growth in the prostatic urethra.  I thought maybe the stone was impacted there, it is the result of granulation tissue, but I biopsied that area, biopsy subsequently came back positive for urothelial carcinoma high grade, and I could not do a TURP at that time, he has negative bladder. On cystoscopy, there was no other pathology in the bladder and I could not do his TURP because he was very weak at that time, now they have called me again that I need to follow him up, so I see he is coming back tomorrow to undergo cystoscopy, and then I will reassess him, see if he needs a TURP because visually he did not have very large prostate, and he has a positive urothelial carcinoma in the prostatic urethra.  So, he is coming tomorrow, I will do a cystoscopy under local anesthesia in the hospital as an outpatient.     Marissa Nestle, M.D.     MIJ/MEDQ  D:  08/10/2013  T:  08/11/2013  Job:  [id number]

## 2013-08-11 NOTE — Discharge Instructions (Signed)
He will need turp to remove tumor from prostatic urethera it is not garuntee that he will be able to void he may need catheter still . He wiil come a out patient have surgery stay overnite most likely go back to nursing home next day with catheter after few days i will dc foley in office . To see if he can void.  Cystoscopy, Care After Refer to this sheet in the next few weeks. These instructions provide you with information on caring for yourself after your procedure. Your caregiver may also give you more specific instructions. Your treatment has been planned according to current medical practices, but problems sometimes occur. Call your caregiver if you have any problems or questions after your procedure. HOME CARE INSTRUCTIONS  Things you can do to ease any discomfort after your procedure include:  Drinking enough water and fluids to keep your urine clear or pale yellow.  Taking a warm bath to relieve any burning feelings. SEEK IMMEDIATE MEDICAL CARE IF:   You have an increase in blood in your urine.  You notice blood clots in your urine.  You have difficulty passing urine.  You have the chills.  You have abdominal pain.  You have a fever or persistent symptoms for more than 2 3 days.  You have a fever and your symptoms suddenly get worse. MAKE SURE YOU:   Understand these instructions.  Will watch your condition.  Will get help right away if you are not doing well or get worse.

## 2013-08-13 ENCOUNTER — Encounter (HOSPITAL_COMMUNITY): Payer: Self-pay | Admitting: Urology

## 2013-09-07 ENCOUNTER — Encounter (HOSPITAL_BASED_OUTPATIENT_CLINIC_OR_DEPARTMENT_OTHER): Payer: PRIVATE HEALTH INSURANCE | Attending: Plastic Surgery

## 2013-09-17 ENCOUNTER — Inpatient Hospital Stay (HOSPITAL_COMMUNITY): Admission: RE | Admit: 2013-09-17 | Payer: PRIVATE HEALTH INSURANCE | Source: Ambulatory Visit

## 2013-09-17 ENCOUNTER — Encounter (HOSPITAL_COMMUNITY)
Admission: RE | Admit: 2013-09-17 | Discharge: 2013-09-17 | Disposition: A | Discharge status: Home / Self Care | Payer: PRIVATE HEALTH INSURANCE | Source: Ambulatory Visit | Attending: Internal Medicine | Admitting: Internal Medicine

## 2013-09-17 DIAGNOSIS — D649 Anemia, unspecified: Secondary | ICD-10-CM | POA: Insufficient documentation

## 2013-09-17 LAB — HEMOGLOBIN AND HEMATOCRIT, BLOOD
HCT: 24.5 % — ABNORMAL LOW (ref 39.0–52.0)
Hemoglobin: 8 g/dL — ABNORMAL LOW (ref 13.0–17.0)

## 2013-09-17 LAB — PREPARE RBC (CROSSMATCH)

## 2013-09-17 NOTE — Progress Notes (Signed)
Results for JUSTINN, WELTER (MRN [id number]) as of 09/17/2013 13:54  Ref. Range 09/17/2013 11:30  Hemoglobin Latest Range: 13.0-17.0 g/dL 8.0 (L)  HCT Latest Range: 39.0-52.0 % 24.5 (L)  Typed and crossmatched for 2 units to be given in AM

## 2013-09-18 ENCOUNTER — Encounter (HOSPITAL_COMMUNITY)
Admission: RE | Admit: 2013-09-18 | Discharge: 2013-09-18 | Disposition: A | Discharge status: Home / Self Care | Payer: PRIVATE HEALTH INSURANCE | Source: Ambulatory Visit | Attending: Internal Medicine | Admitting: Internal Medicine

## 2013-09-18 DIAGNOSIS — D649 Anemia, unspecified: Secondary | ICD-10-CM | POA: Diagnosis not present

## 2013-09-18 LAB — GLUCOSE, CAPILLARY: GLUCOSE-CAPILLARY: 178 mg/dL — AB (ref 70–99)

## 2013-09-18 MED ORDER — ACETAMINOPHEN 325 MG PO TABS
650.0000 mg | ORAL_TABLET | Freq: Once | ORAL | Status: AC
  Administered 2013-09-18: 650 mg via ORAL

## 2013-09-18 MED ORDER — DIPHENHYDRAMINE HCL 25 MG PO CAPS
25.0000 mg | ORAL_CAPSULE | Freq: Once | ORAL | Status: AC
  Administered 2013-09-18: 25 mg via ORAL

## 2013-09-18 MED ORDER — DIPHENHYDRAMINE HCL 25 MG PO CAPS
ORAL_CAPSULE | ORAL | Status: AC
  Filled 2013-09-18: qty 1

## 2013-09-18 MED ORDER — SODIUM CHLORIDE 0.9 % IV SOLN
INTRAVENOUS | Status: DC
  Administered 2013-09-18: 09:00:00 via INTRAVENOUS

## 2013-09-18 MED ORDER — ACETAMINOPHEN 325 MG PO TABS
ORAL_TABLET | ORAL | Status: AC
  Filled 2013-09-18: qty 2

## 2013-09-19 LAB — TYPE AND SCREEN
ABO/RH(D): A POS
Antibody Screen: NEGATIVE
UNIT DIVISION: 0
Unit division: 0

## 2013-10-13 ENCOUNTER — Institutional Professional Consult (permissible substitution) (INDEPENDENT_AMBULATORY_CARE_PROVIDER_SITE_OTHER): Payer: PRIVATE HEALTH INSURANCE | Admitting: Urology

## 2013-10-13 DIAGNOSIS — IMO0002 Reserved for concepts with insufficient information to code with codable children: Secondary | ICD-10-CM

## 2013-10-13 DIAGNOSIS — Q549 Hypospadias, unspecified: Secondary | ICD-10-CM

## 2013-10-13 DIAGNOSIS — R339 Retention of urine, unspecified: Secondary | ICD-10-CM

## 2013-12-22 ENCOUNTER — Ambulatory Visit: Payer: PRIVATE HEALTH INSURANCE | Admitting: Urology

## 2014-01-05 ENCOUNTER — Ambulatory Visit (INDEPENDENT_AMBULATORY_CARE_PROVIDER_SITE_OTHER): Admitting: Urology

## 2014-01-05 DIAGNOSIS — Q549 Hypospadias, unspecified: Secondary | ICD-10-CM

## 2014-01-05 DIAGNOSIS — R339 Retention of urine, unspecified: Secondary | ICD-10-CM

## 2014-01-05 DIAGNOSIS — N360 Urethral fistula: Secondary | ICD-10-CM

## 2014-02-07 IMAGING — CR DG CHEST 1V PORT
1 series · 1 of 1 positions shown · non-contrast
Comparison: 12/03/2012

CLINICAL DATA: Fatigue

PORTABLE CHEST - 1 VIEW

[portable]
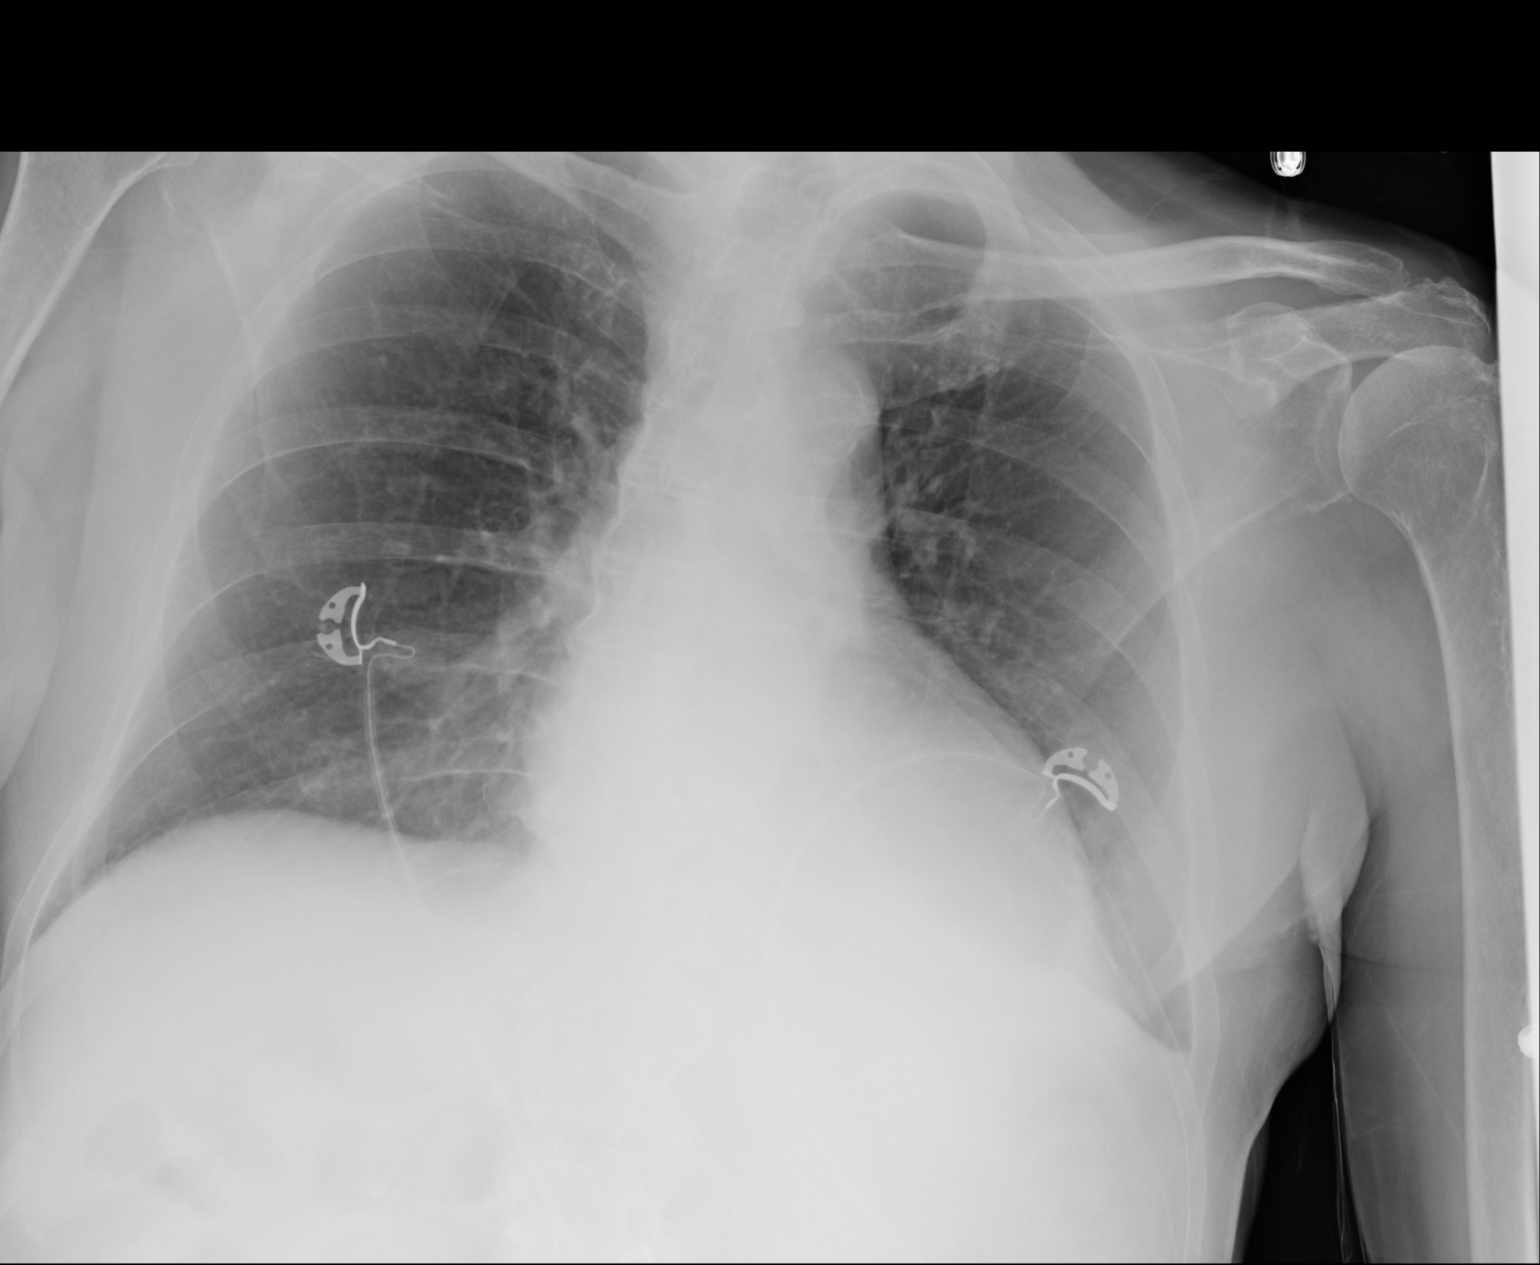

[1 of 1 positions shown; findings below may reference images not displayed]

FINDINGS: Cardiomediastinal silhouette is stable.  No pulmonary
edema.  Question trace left pleural effusion with left basilar
atelectasis.  No segmental infiltrate.
IMPRESSION: Question trace left pleural effusion with left basilar atelectasis.
No segmental infiltrate.  No pulmonary edema.

## 2014-07-06 ENCOUNTER — Ambulatory Visit: Payer: Medicaid Other | Admitting: Urology

## 2014-08-10 ENCOUNTER — Ambulatory Visit (INDEPENDENT_AMBULATORY_CARE_PROVIDER_SITE_OTHER): Admitting: Urology

## 2014-08-10 DIAGNOSIS — R339 Retention of urine, unspecified: Secondary | ICD-10-CM | POA: Diagnosis not present

## 2014-08-10 DIAGNOSIS — N369 Urethral disorder, unspecified: Secondary | ICD-10-CM | POA: Diagnosis not present

## 2014-08-10 DIAGNOSIS — Q548 Other hypospadias: Secondary | ICD-10-CM | POA: Diagnosis not present

## 2015-03-22 DEATH — deceased
